# Patient Record
Sex: Female | Born: 1955
Health system: Southern US, Community
[De-identification: ages and names within clinical notes are randomized; demographics above are authoritative.]

## PROBLEM LIST (undated history)

## (undated) DIAGNOSIS — D126 Benign neoplasm of colon, unspecified: Secondary | ICD-10-CM

## (undated) DIAGNOSIS — E785 Hyperlipidemia, unspecified: Secondary | ICD-10-CM

## (undated) DIAGNOSIS — D649 Anemia, unspecified: Secondary | ICD-10-CM

## (undated) DIAGNOSIS — B20 Human immunodeficiency virus [HIV] disease: Secondary | ICD-10-CM

## (undated) DIAGNOSIS — D259 Leiomyoma of uterus, unspecified: Secondary | ICD-10-CM

## (undated) DIAGNOSIS — F17200 Nicotine dependence, unspecified, uncomplicated: Secondary | ICD-10-CM

## (undated) DIAGNOSIS — I10 Essential (primary) hypertension: Secondary | ICD-10-CM

## (undated) DIAGNOSIS — L732 Hidradenitis suppurativa: Secondary | ICD-10-CM

## (undated) DIAGNOSIS — F329 Major depressive disorder, single episode, unspecified: Secondary | ICD-10-CM

## (undated) DIAGNOSIS — B1910 Unspecified viral hepatitis B without hepatic coma: Secondary | ICD-10-CM

## (undated) HISTORY — DX: Nicotine dependence, unspecified, uncomplicated: F17.200

## (undated) HISTORY — DX: Unspecified viral hepatitis B without hepatic coma: B19.10

## (undated) HISTORY — DX: Essential (primary) hypertension: I10

## (undated) HISTORY — PX: BREAST LUMPECTOMY: SHX2

## (undated) HISTORY — DX: Hyperlipidemia, unspecified: E78.5

## (undated) HISTORY — DX: Anemia, unspecified: D64.9

## (undated) HISTORY — DX: Human immunodeficiency virus (HIV) disease: B20

## (undated) HISTORY — DX: Hidradenitis suppurativa: L73.2

## (undated) HISTORY — DX: Major depressive disorder, single episode, unspecified: F32.9

## (undated) HISTORY — PX: OTHER SURGICAL HISTORY: SHX169

## (undated) HISTORY — DX: Benign neoplasm of colon, unspecified: D12.6

## (undated) HISTORY — DX: Leiomyoma of uterus, unspecified: D25.9

---

## 1997-04-18 ENCOUNTER — Other Ambulatory Visit: Admission: RE | Admit: 1997-04-18 | Discharge: 1997-04-18 | Payer: Self-pay | Admitting: Family Medicine

## 1997-05-08 ENCOUNTER — Encounter: Admission: RE | Admit: 1997-05-08 | Discharge: 1997-05-08 | Payer: Self-pay | Admitting: Family Medicine

## 1997-05-22 ENCOUNTER — Encounter: Admission: RE | Admit: 1997-05-22 | Discharge: 1997-05-22 | Payer: Self-pay | Admitting: Family Medicine

## 1997-08-30 ENCOUNTER — Encounter: Admission: RE | Admit: 1997-08-30 | Discharge: 1997-08-30 | Payer: Self-pay | Admitting: Family Medicine

## 1997-10-04 ENCOUNTER — Encounter: Admission: RE | Admit: 1997-10-04 | Discharge: 1997-10-04 | Payer: Self-pay | Admitting: Family Medicine

## 1997-10-18 ENCOUNTER — Encounter: Admission: RE | Admit: 1997-10-18 | Discharge: 1997-10-18 | Payer: Self-pay | Admitting: Family Medicine

## 1997-11-13 ENCOUNTER — Encounter: Admission: RE | Admit: 1997-11-13 | Discharge: 1997-11-13 | Payer: Self-pay | Admitting: Sports Medicine

## 1998-03-05 ENCOUNTER — Encounter: Admission: RE | Admit: 1998-03-05 | Discharge: 1998-03-05 | Payer: Self-pay | Admitting: Sports Medicine

## 1998-07-09 ENCOUNTER — Encounter: Admission: RE | Admit: 1998-07-09 | Discharge: 1998-07-09 | Payer: Self-pay | Admitting: Family Medicine

## 1998-08-08 ENCOUNTER — Encounter: Admission: RE | Admit: 1998-08-08 | Discharge: 1998-08-08 | Payer: Self-pay | Admitting: Family Medicine

## 1998-12-04 ENCOUNTER — Encounter: Admission: RE | Admit: 1998-12-04 | Discharge: 1998-12-04 | Payer: Self-pay | Admitting: Family Medicine

## 1999-01-27 HISTORY — PX: OVARIAN CYST REMOVAL: SHX89

## 1999-07-04 ENCOUNTER — Encounter: Admission: RE | Admit: 1999-07-04 | Discharge: 1999-07-04 | Payer: Self-pay | Admitting: Family Medicine

## 1999-07-17 ENCOUNTER — Encounter: Payer: Self-pay | Admitting: General Surgery

## 1999-07-18 ENCOUNTER — Encounter (INDEPENDENT_AMBULATORY_CARE_PROVIDER_SITE_OTHER): Payer: Self-pay | Admitting: Specialist

## 1999-07-18 ENCOUNTER — Ambulatory Visit (HOSPITAL_COMMUNITY): Admission: RE | Admit: 1999-07-18 | Discharge: 1999-07-18 | Payer: Self-pay | Admitting: General Surgery

## 1999-08-21 ENCOUNTER — Encounter: Admission: RE | Admit: 1999-08-21 | Discharge: 1999-08-21 | Payer: Self-pay | Admitting: Family Medicine

## 1999-08-28 ENCOUNTER — Encounter: Admission: RE | Admit: 1999-08-28 | Discharge: 1999-08-28 | Payer: Self-pay | Admitting: *Deleted

## 1999-08-28 ENCOUNTER — Encounter: Payer: Self-pay | Admitting: *Deleted

## 1999-09-22 ENCOUNTER — Encounter: Admission: RE | Admit: 1999-09-22 | Discharge: 1999-09-22 | Payer: Self-pay | Admitting: Family Medicine

## 1999-10-02 ENCOUNTER — Encounter: Admission: RE | Admit: 1999-10-02 | Discharge: 1999-10-02 | Payer: Self-pay | Admitting: Family Medicine

## 1999-10-23 ENCOUNTER — Encounter: Admission: RE | Admit: 1999-10-23 | Discharge: 1999-10-23 | Payer: Self-pay | Admitting: Obstetrics

## 2000-05-26 ENCOUNTER — Encounter: Admission: RE | Admit: 2000-05-26 | Discharge: 2000-05-26 | Payer: Self-pay | Admitting: Family Medicine

## 2000-06-29 ENCOUNTER — Encounter: Admission: RE | Admit: 2000-06-29 | Discharge: 2000-06-29 | Payer: Self-pay | Admitting: Family Medicine

## 2000-07-01 ENCOUNTER — Encounter: Admission: RE | Admit: 2000-07-01 | Discharge: 2000-07-01 | Payer: Self-pay | Admitting: Sports Medicine

## 2000-07-01 ENCOUNTER — Encounter: Payer: Self-pay | Admitting: Sports Medicine

## 2000-07-08 ENCOUNTER — Encounter: Admission: RE | Admit: 2000-07-08 | Discharge: 2000-10-06 | Payer: Self-pay | Admitting: *Deleted

## 2000-07-13 ENCOUNTER — Encounter: Admission: RE | Admit: 2000-07-13 | Discharge: 2000-07-13 | Payer: Self-pay | Admitting: Sports Medicine

## 2000-08-03 ENCOUNTER — Encounter: Admission: RE | Admit: 2000-08-03 | Discharge: 2000-08-03 | Payer: Self-pay | Admitting: Obstetrics & Gynecology

## 2001-01-12 ENCOUNTER — Encounter: Admission: RE | Admit: 2001-01-12 | Discharge: 2001-01-12 | Payer: Self-pay | Admitting: Family Medicine

## 2001-01-27 ENCOUNTER — Encounter: Admission: RE | Admit: 2001-01-27 | Discharge: 2001-01-27 | Payer: Self-pay | Admitting: Family Medicine

## 2001-04-25 ENCOUNTER — Encounter (INDEPENDENT_AMBULATORY_CARE_PROVIDER_SITE_OTHER): Payer: Self-pay | Admitting: *Deleted

## 2001-04-25 ENCOUNTER — Encounter: Admission: RE | Admit: 2001-04-25 | Discharge: 2001-04-25 | Payer: Self-pay | Admitting: Family Medicine

## 2001-05-03 ENCOUNTER — Encounter: Payer: Self-pay | Admitting: Sports Medicine

## 2001-08-24 ENCOUNTER — Encounter: Admission: RE | Admit: 2001-08-24 | Discharge: 2001-08-24 | Payer: Self-pay | Admitting: Family Medicine

## 2002-01-03 ENCOUNTER — Encounter: Admission: RE | Admit: 2002-01-03 | Discharge: 2002-01-03 | Payer: Self-pay | Admitting: Family Medicine

## 2002-07-05 ENCOUNTER — Encounter: Admission: RE | Admit: 2002-07-05 | Discharge: 2002-07-05 | Payer: Self-pay | Admitting: Family Medicine

## 2003-01-23 ENCOUNTER — Encounter: Admission: RE | Admit: 2003-01-23 | Discharge: 2003-01-23 | Payer: Self-pay | Admitting: Family Medicine

## 2003-01-25 ENCOUNTER — Encounter: Admission: RE | Admit: 2003-01-25 | Discharge: 2003-01-25 | Payer: Self-pay | Admitting: Family Medicine

## 2003-01-29 ENCOUNTER — Encounter: Admission: RE | Admit: 2003-01-29 | Discharge: 2003-01-29 | Payer: Self-pay | Admitting: Family Medicine

## 2003-02-02 ENCOUNTER — Encounter: Admission: RE | Admit: 2003-02-02 | Discharge: 2003-02-02 | Payer: Self-pay | Admitting: Family Medicine

## 2003-10-03 ENCOUNTER — Ambulatory Visit: Payer: Self-pay | Admitting: Family Medicine

## 2003-10-24 ENCOUNTER — Ambulatory Visit: Payer: Self-pay | Admitting: Sports Medicine

## 2003-10-31 ENCOUNTER — Encounter: Admission: RE | Admit: 2003-10-31 | Discharge: 2003-10-31 | Payer: Self-pay | Admitting: Sports Medicine

## 2003-11-22 ENCOUNTER — Ambulatory Visit: Payer: Self-pay | Admitting: Family Medicine

## 2004-02-06 ENCOUNTER — Ambulatory Visit: Payer: Self-pay | Admitting: Infectious Diseases

## 2004-02-06 ENCOUNTER — Encounter (INDEPENDENT_AMBULATORY_CARE_PROVIDER_SITE_OTHER): Payer: Self-pay | Admitting: *Deleted

## 2004-02-06 ENCOUNTER — Ambulatory Visit (HOSPITAL_COMMUNITY): Admission: RE | Admit: 2004-02-06 | Discharge: 2004-02-06 | Payer: Self-pay | Admitting: Infectious Diseases

## 2004-02-06 LAB — CONVERTED CEMR LAB
CD4 Count: 1200 microliters
CD4 T Cell Abs: 1200

## 2004-02-25 ENCOUNTER — Ambulatory Visit: Payer: Self-pay | Admitting: Infectious Diseases

## 2004-03-08 ENCOUNTER — Emergency Department (HOSPITAL_COMMUNITY): Admission: AD | Admit: 2004-03-08 | Discharge: 2004-03-08 | Payer: Self-pay | Admitting: Family Medicine

## 2004-04-28 ENCOUNTER — Ambulatory Visit: Payer: Self-pay | Admitting: Family Medicine

## 2004-05-21 ENCOUNTER — Ambulatory Visit: Payer: Self-pay | Admitting: Family Medicine

## 2004-07-08 ENCOUNTER — Ambulatory Visit: Payer: Self-pay | Admitting: Family Medicine

## 2004-07-09 ENCOUNTER — Ambulatory Visit (HOSPITAL_COMMUNITY): Admission: RE | Admit: 2004-07-09 | Discharge: 2004-07-09 | Payer: Self-pay | Admitting: Infectious Diseases

## 2004-07-09 ENCOUNTER — Encounter (INDEPENDENT_AMBULATORY_CARE_PROVIDER_SITE_OTHER): Payer: Self-pay | Admitting: *Deleted

## 2004-07-09 ENCOUNTER — Ambulatory Visit: Payer: Self-pay | Admitting: Infectious Diseases

## 2004-07-09 LAB — CONVERTED CEMR LAB: CD4 Count: 1320 microliters

## 2004-11-17 ENCOUNTER — Ambulatory Visit: Payer: Self-pay | Admitting: Family Medicine

## 2005-02-27 ENCOUNTER — Ambulatory Visit: Payer: Self-pay | Admitting: Family Medicine

## 2005-03-12 ENCOUNTER — Ambulatory Visit: Payer: Self-pay | Admitting: Infectious Diseases

## 2005-03-12 ENCOUNTER — Encounter (INDEPENDENT_AMBULATORY_CARE_PROVIDER_SITE_OTHER): Payer: Self-pay | Admitting: *Deleted

## 2005-03-18 ENCOUNTER — Encounter: Admission: RE | Admit: 2005-03-18 | Discharge: 2005-03-18 | Payer: Self-pay | Admitting: Sports Medicine

## 2005-03-26 ENCOUNTER — Ambulatory Visit: Payer: Self-pay | Admitting: Infectious Diseases

## 2005-04-24 ENCOUNTER — Ambulatory Visit: Payer: Self-pay | Admitting: Family Medicine

## 2005-07-15 ENCOUNTER — Ambulatory Visit: Payer: Self-pay | Admitting: Family Medicine

## 2005-07-15 ENCOUNTER — Ambulatory Visit (HOSPITAL_COMMUNITY): Admission: RE | Admit: 2005-07-15 | Discharge: 2005-07-15 | Payer: Self-pay | Admitting: Family Medicine

## 2006-03-05 ENCOUNTER — Encounter (INDEPENDENT_AMBULATORY_CARE_PROVIDER_SITE_OTHER): Payer: Self-pay | Admitting: *Deleted

## 2006-03-10 ENCOUNTER — Ambulatory Visit: Payer: Self-pay | Admitting: Family Medicine

## 2006-03-10 ENCOUNTER — Encounter: Payer: Self-pay | Admitting: Family Medicine

## 2006-03-10 ENCOUNTER — Other Ambulatory Visit: Admission: RE | Admit: 2006-03-10 | Discharge: 2006-03-10 | Payer: Self-pay | Admitting: Family Medicine

## 2006-03-10 ENCOUNTER — Encounter (INDEPENDENT_AMBULATORY_CARE_PROVIDER_SITE_OTHER): Payer: Self-pay | Admitting: *Deleted

## 2006-03-22 ENCOUNTER — Encounter (INDEPENDENT_AMBULATORY_CARE_PROVIDER_SITE_OTHER): Payer: Self-pay | Admitting: *Deleted

## 2006-03-22 LAB — CONVERTED CEMR LAB: Pap Smear: NORMAL

## 2006-03-24 ENCOUNTER — Encounter: Admission: RE | Admit: 2006-03-24 | Discharge: 2006-03-24 | Payer: Self-pay | Admitting: Sports Medicine

## 2006-03-25 DIAGNOSIS — B1910 Unspecified viral hepatitis B without hepatic coma: Secondary | ICD-10-CM | POA: Insufficient documentation

## 2006-03-25 DIAGNOSIS — E785 Hyperlipidemia, unspecified: Secondary | ICD-10-CM

## 2006-03-25 DIAGNOSIS — F17200 Nicotine dependence, unspecified, uncomplicated: Secondary | ICD-10-CM

## 2006-03-25 DIAGNOSIS — J309 Allergic rhinitis, unspecified: Secondary | ICD-10-CM | POA: Insufficient documentation

## 2006-03-25 DIAGNOSIS — B20 Human immunodeficiency virus [HIV] disease: Secondary | ICD-10-CM | POA: Insufficient documentation

## 2006-03-25 DIAGNOSIS — D259 Leiomyoma of uterus, unspecified: Secondary | ICD-10-CM | POA: Insufficient documentation

## 2006-03-25 DIAGNOSIS — D649 Anemia, unspecified: Secondary | ICD-10-CM | POA: Insufficient documentation

## 2006-03-25 DIAGNOSIS — L732 Hidradenitis suppurativa: Secondary | ICD-10-CM | POA: Insufficient documentation

## 2006-03-25 DIAGNOSIS — Z716 Tobacco abuse counseling: Secondary | ICD-10-CM | POA: Insufficient documentation

## 2006-03-25 DIAGNOSIS — N83209 Unspecified ovarian cyst, unspecified side: Secondary | ICD-10-CM | POA: Insufficient documentation

## 2006-03-25 DIAGNOSIS — N951 Menopausal and female climacteric states: Secondary | ICD-10-CM | POA: Insufficient documentation

## 2006-03-25 HISTORY — DX: Nicotine dependence, unspecified, uncomplicated: F17.200

## 2006-03-25 HISTORY — DX: Leiomyoma of uterus, unspecified: D25.9

## 2006-03-25 HISTORY — DX: Anemia, unspecified: D64.9

## 2006-03-25 HISTORY — DX: Menopausal and female climacteric states: N95.1

## 2006-03-25 HISTORY — DX: Hyperlipidemia, unspecified: E78.5

## 2006-03-25 HISTORY — DX: Human immunodeficiency virus (HIV) disease: B20

## 2006-03-25 HISTORY — DX: Hidradenitis suppurativa: L73.2

## 2006-03-25 HISTORY — DX: Unspecified viral hepatitis B without hepatic coma: B19.10

## 2006-03-26 ENCOUNTER — Encounter (INDEPENDENT_AMBULATORY_CARE_PROVIDER_SITE_OTHER): Payer: Self-pay | Admitting: *Deleted

## 2006-03-31 ENCOUNTER — Telehealth: Payer: Self-pay | Admitting: Family Medicine

## 2006-04-04 ENCOUNTER — Encounter (INDEPENDENT_AMBULATORY_CARE_PROVIDER_SITE_OTHER): Payer: Self-pay | Admitting: *Deleted

## 2006-04-08 ENCOUNTER — Encounter: Admission: RE | Admit: 2006-04-08 | Discharge: 2006-04-08 | Payer: Self-pay | Admitting: Sports Medicine

## 2006-04-09 ENCOUNTER — Encounter: Payer: Self-pay | Admitting: Family Medicine

## 2006-07-08 ENCOUNTER — Encounter: Payer: Self-pay | Admitting: Family Medicine

## 2007-03-17 ENCOUNTER — Telehealth: Payer: Self-pay | Admitting: Psychology

## 2007-03-17 ENCOUNTER — Ambulatory Visit: Payer: Self-pay | Admitting: Family Medicine

## 2007-03-17 ENCOUNTER — Encounter: Payer: Self-pay | Admitting: Family Medicine

## 2007-03-17 DIAGNOSIS — F329 Major depressive disorder, single episode, unspecified: Secondary | ICD-10-CM

## 2007-03-17 DIAGNOSIS — F3289 Other specified depressive episodes: Secondary | ICD-10-CM

## 2007-03-17 DIAGNOSIS — M5136 Other intervertebral disc degeneration, lumbar region: Secondary | ICD-10-CM | POA: Insufficient documentation

## 2007-03-17 DIAGNOSIS — F32A Depression, unspecified: Secondary | ICD-10-CM | POA: Insufficient documentation

## 2007-03-17 DIAGNOSIS — M539 Dorsopathy, unspecified: Secondary | ICD-10-CM | POA: Insufficient documentation

## 2007-03-17 HISTORY — DX: Other specified depressive episodes: F32.89

## 2007-03-17 HISTORY — DX: Major depressive disorder, single episode, unspecified: F32.9

## 2007-03-25 ENCOUNTER — Encounter: Payer: Self-pay | Admitting: Family Medicine

## 2007-03-25 LAB — CONVERTED CEMR LAB
ALT: 9 units/L (ref 0–35)
Absolute CD4: 1270 #/uL (ref 381–1469)
Albumin: 4.3 g/dL (ref 3.5–5.2)
Alkaline Phosphatase: 62 units/L (ref 39–117)
Basophils Relative: 1 % (ref 0–1)
CD4 T Helper %: 46 % (ref 32–62)
CO2: 25 meq/L (ref 19–32)
Calcium: 9.5 mg/dL (ref 8.4–10.5)
Creatinine, Ser: 0.73 mg/dL (ref 0.40–1.20)
Glucose, Bld: 81 mg/dL (ref 70–99)
HCT: 36.8 % (ref 36.0–46.0)
HIV 1 RNA Quant: <50 copies/mL (ref <50)
HIV-1 RNA Quant, Log: <1.7 (ref <1.70)
LDL Cholesterol: 104 mg/dL — ABNORMAL HIGH (ref 0–99)
Lymphs Abs: 2.8 10*3/uL (ref 0.7–4.0)
MCV: 73.3 fL — ABNORMAL LOW (ref 78.0–100.0)
Neutro Abs: 3.5 10*3/uL (ref 1.7–7.7)
Neutrophils Relative %: 51 % (ref 43–77)
RBC: 5.02 M/uL (ref 3.87–5.11)
RDW: 18.3 % — ABNORMAL HIGH (ref 11.5–15.5)
Total lymphocyte count: 2760 cells/mcL (ref 700–3300)
VLDL: 16 mg/dL (ref 0–40)
WBC, lymph enumeration: 6.9 10*3/uL (ref 4.0–10.5)

## 2007-04-04 ENCOUNTER — Ambulatory Visit: Payer: Self-pay | Admitting: Sports Medicine

## 2007-04-18 ENCOUNTER — Ambulatory Visit: Payer: Self-pay | Admitting: Sports Medicine

## 2007-04-26 ENCOUNTER — Ambulatory Visit: Payer: Self-pay | Admitting: Family Medicine

## 2007-04-26 DIAGNOSIS — I10 Essential (primary) hypertension: Secondary | ICD-10-CM | POA: Insufficient documentation

## 2007-04-26 HISTORY — DX: Essential (primary) hypertension: I10

## 2007-05-02 ENCOUNTER — Ambulatory Visit: Payer: Self-pay | Admitting: Psychology

## 2007-05-03 ENCOUNTER — Encounter (INDEPENDENT_AMBULATORY_CARE_PROVIDER_SITE_OTHER): Payer: Self-pay | Admitting: *Deleted

## 2007-05-05 ENCOUNTER — Telehealth: Payer: Self-pay | Admitting: Psychology

## 2007-05-09 ENCOUNTER — Encounter: Admission: RE | Admit: 2007-05-09 | Discharge: 2007-05-09 | Payer: Self-pay | Admitting: Infectious Disease

## 2007-05-09 ENCOUNTER — Ambulatory Visit: Payer: Self-pay | Admitting: Infectious Disease

## 2007-05-09 LAB — CONVERTED CEMR LAB
Alkaline Phosphatase: 75 units/L (ref 39–117)
Basophils Relative: 1 % (ref 0–1)
CO2: 20 meq/L (ref 19–32)
Calcium: 9.7 mg/dL (ref 8.4–10.5)
Chloride: 106 meq/L (ref 96–112)
Creatinine, Ser: 0.71 mg/dL (ref 0.40–1.20)
Eosinophils Relative: 2 % (ref 0–5)
Glucose, Bld: 79 mg/dL (ref 70–99)
HCT: 36.6 % (ref 36.0–46.0)
HIV 1 RNA Quant: <50 copies/mL (ref <50)
HIV-1 RNA Quant, Log: <1.7 (ref <1.70)
Neutro Abs: 2.7 10*3/uL (ref 1.7–7.7)
Neutrophils Relative %: 46 % (ref 43–77)
Platelets: 518 10*3/uL — ABNORMAL HIGH (ref 150–400)
RBC: 5.14 M/uL — ABNORMAL HIGH (ref 3.87–5.11)
Sodium: 138 meq/L (ref 135–145)
Total Bilirubin: 0.3 mg/dL (ref 0.3–1.2)
WBC: 5.9 10*3/uL (ref 4.0–10.5)

## 2007-05-16 ENCOUNTER — Telehealth: Payer: Self-pay | Admitting: Psychology

## 2007-05-24 ENCOUNTER — Encounter: Admission: RE | Admit: 2007-05-24 | Discharge: 2007-05-24 | Payer: Self-pay | Admitting: Family Medicine

## 2007-09-05 ENCOUNTER — Encounter: Admission: RE | Admit: 2007-09-05 | Discharge: 2007-09-05 | Payer: Self-pay | Admitting: Infectious Disease

## 2007-09-05 ENCOUNTER — Ambulatory Visit: Payer: Self-pay | Admitting: Infectious Disease

## 2007-09-05 LAB — CONVERTED CEMR LAB
ALT: 11 units/L (ref 0–35)
Albumin: 4.3 g/dL (ref 3.5–5.2)
Alkaline Phosphatase: 68 units/L (ref 39–117)
BUN: 10 mg/dL (ref 6–23)
Basophils Absolute: 0.1 10*3/uL (ref 0.0–0.1)
CO2: 22 meq/L (ref 19–32)
Chloride: 105 meq/L (ref 96–112)
Creatinine, Ser: 0.68 mg/dL (ref 0.40–1.20)
HIV 1 RNA Quant: <50 copies/mL (ref <50)
MCHC: 32.6 g/dL (ref 30.0–36.0)
MCV: 73.4 fL — ABNORMAL LOW (ref 78.0–100.0)
Neutrophils Relative %: 42 % — ABNORMAL LOW (ref 43–77)
Potassium: 4.6 meq/L (ref 3.5–5.3)
RBC: 5.3 M/uL — ABNORMAL HIGH (ref 3.87–5.11)
Sodium: 140 meq/L (ref 135–145)
Total Protein: 8.3 g/dL (ref 6.0–8.3)

## 2007-09-08 ENCOUNTER — Ambulatory Visit: Payer: Self-pay | Admitting: Family Medicine

## 2007-09-19 ENCOUNTER — Ambulatory Visit: Payer: Self-pay | Admitting: Infectious Disease

## 2007-09-19 LAB — CONVERTED CEMR LAB
Chlamydia, Swab/Urine, PCR: NEGATIVE
GC Probe Amp, Urine: NEGATIVE

## 2007-09-26 ENCOUNTER — Encounter: Payer: Self-pay | Admitting: Infectious Disease

## 2007-10-04 ENCOUNTER — Ambulatory Visit: Payer: Self-pay | Admitting: Infectious Disease

## 2007-10-04 ENCOUNTER — Encounter: Payer: Self-pay | Admitting: Infectious Disease

## 2007-10-19 ENCOUNTER — Encounter: Payer: Self-pay | Admitting: Infectious Disease

## 2008-05-01 ENCOUNTER — Ambulatory Visit: Payer: Self-pay | Admitting: Family Medicine

## 2008-05-14 ENCOUNTER — Encounter: Payer: Self-pay | Admitting: *Deleted

## 2009-04-01 ENCOUNTER — Ambulatory Visit: Payer: Self-pay | Admitting: Infectious Disease

## 2009-04-01 LAB — CONVERTED CEMR LAB
ALT: 13 units/L (ref 0–35)
AST: 12 units/L (ref 0–37)
Albumin: 4.1 g/dL (ref 3.5–5.2)
BUN: 9 mg/dL (ref 6–23)
Basophils Relative: 1 % (ref 0–1)
Creatinine, Ser: 0.7 mg/dL (ref 0.40–1.20)
HCT: 36.1 % (ref 36.0–46.0)
HDL: 49 mg/dL (ref >39)
HIV 1 RNA Quant: <48 copies/mL (ref <48)
HIV-1 RNA Quant, Log: <1.68 (ref <1.68)
Lymphs Abs: 3 10*3/uL (ref 0.7–4.0)
MCHC: 32.7 g/dL (ref 30.0–36.0)
Monocytes Relative: 6 % (ref 3–12)
RBC: 4.85 M/uL (ref 3.87–5.11)
RDW: 17.6 % — ABNORMAL HIGH (ref 11.5–15.5)
Total CHOL/HDL Ratio: 4.1
Total Protein: 7.8 g/dL (ref 6.0–8.3)
Triglycerides: 83 mg/dL (ref <150)
VLDL: 17 mg/dL (ref 0–40)

## 2009-04-15 ENCOUNTER — Ambulatory Visit: Payer: Self-pay | Admitting: Infectious Disease

## 2009-04-22 ENCOUNTER — Emergency Department (HOSPITAL_COMMUNITY): Admission: EM | Admit: 2009-04-22 | Discharge: 2009-04-22 | Payer: Self-pay | Admitting: Emergency Medicine

## 2009-05-09 ENCOUNTER — Ambulatory Visit: Payer: Self-pay | Admitting: Family Medicine

## 2009-05-27 ENCOUNTER — Encounter: Payer: Self-pay | Admitting: Infectious Disease

## 2009-06-11 ENCOUNTER — Ambulatory Visit: Payer: Self-pay | Admitting: Family Medicine

## 2009-06-14 ENCOUNTER — Encounter: Payer: Self-pay | Admitting: Family Medicine

## 2009-06-18 ENCOUNTER — Encounter: Admission: RE | Admit: 2009-06-18 | Discharge: 2009-06-18 | Payer: Self-pay | Admitting: Family Medicine

## 2009-09-27 ENCOUNTER — Ambulatory Visit: Payer: Self-pay | Admitting: Family Medicine

## 2010-02-25 NOTE — Letter (Signed)
Summary: Results Follow-up Letter  [facility]  [address]   [place], [place] [address]   Phone: [phone]  Fax: [phone]    06/14/2009  [address] [place], [place]  [address]  Dear Ms. Graciela Husbands,   The following are the results of your recent test(s):  Test     Result     Pap Smear    Normal____X___  Not Normal_____       Comments:  Next Pap will be in 3 yrs.  Sincerely,  Bobby Rumpf  MD [facility]           Appended Document: Results Follow-up Letter mailed.

## 2010-02-25 NOTE — Assessment & Plan Note (Signed)
Summary: EST-CK/FU/MEDS/CFB   Visit Type:  Follow-up Primary Provider:  Norton Blizzard  CC:  f/u, Depression, hot flashes are very bad, and seating alot at night and can't sleep.  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 She has gotten back to gether with her former husband (who is HIV negative she laims) She states that they have had intercourse twce in past 6 months. She calims to be using condoms with intercourse. She has no comlaints today.  Depression History:      The patient denies a depressed mood most of the day and a diminished interest in her usual daily activities.        The patient denies that she feels like life is not worth living, denies that she wishes that she were dead, and denies that she has thought about ending her life.        Problems Prior to Update: 1)  Laceration of Finger  (ICD-883.0) 2)  Preventive Health Care  (ICD-V70.0) 3)  Screening For Malignant Neoplasm of The Cervix  (ICD-V76.2) 4)  Elevated Blood Pressure Without Diagnosis of Hypertension  (ICD-796.2) 5)  Back Pain, Lumbar  (ICD-724.2) 6)  Depression  (ICD-311) 7)  Uterine Fibroid  (ICD-218.9) 8)  Tobacco Dependence  (ICD-305.1) 9)  Rhinitis, Allergic  (ICD-477.9) 10)  Ovarian Cyst  (ICD-620.2) 11)  Menopausal Syndrome  (ICD-627.2) 12)  Hyperlipidemia  (ICD-272.4) 13)  Hydradenitis  (ICD-705.83) 14)  HIV  (ICD-042) 15)  Hepatitis B  (ICD-070.30) 16)  Anemia, Other, Unspecified  (ICD-285.9)  Medications Prior to Update: 1)  None  Current Medications (verified): 1)  None  Allergies (verified): No Known Drug Allergies       Preventive Screening-Counseling & Management  Alcohol-Tobacco     Smoking Status: current     Smoking Cessation Counseling: yes     Packs/Day: 0.5      Sexual History:  currently monogamous.        Drug Use:  never.        Blood Transfusions:  no.       Current Allergies (reviewed today): No known allergies  Past History:  Past Surgical History: Last updated: 03/25/2006 Cyst removal--[place] - 08/21/1999  Family History: Last updated: 03/25/2006 ?Aunt w/ CABG., Mom w/ dementia?, Mom, 2 aunts had hysterectomy, but pt not sure why., Unsure of dad`s side.  Social History: Last updated: 05/09/2007 Tob abuse 1 ppd since 29 yoa.; h/o drugs (MJ, cocaine, pills)  and etoh abuse.; Works at [employer].  Lives with Mother.  Has one adult daughter living in [place]  Risk Factors: Smoking Status: current (04/15/2009) Packs/Day: 0.5 (04/15/2009)  Past Medical History: HIV--lian "elite controller": 06/00 - CD4=1180; Viral Load <25, 06/01 - CD4=1000; Viral Load <50, microcytosis- work up w/ferritin wnl02/06 - CD4=1200; Viral Load <50,  02/07 - CD4=1600, Viral Load <50, HGB 11.9, MCV 72,  04/09 cd4 940 v;l<50 08/09 CD4= 1220, viral load <50 Past Hepatis B infection, Hep b DNA undetectable in June Hidradenitis suppurativa Smoking Depression Seasonal Allergies  Social History: Drug Use:  never Blood Transfusions:  no Sexual History:  currently monogamous  Vital Signs:  Patient profile:   55 year old female Height:      61 inches (154.94 cm) Weight:      150 pounds (68.18 kg) BMI:     28.44 Temp:     99.2 degrees F (37.33 degrees C) oral Pulse  rate:   89 / minute BP sitting:   131 / 84  (left arm)  Vitals Entered By: Starleen Arms CMA (April 15, 2009 2:49 PM) CC: f/u, Depression, hot flashes are very bad, seating alot at night and can't sleep Is Patient Diabetic? No Pain Assessment Patient in pain? yes     Location: back Intensity: 9 Type: aching Onset of pain  Chronic Nutritional Status BMI of 25 - 29 = overweight Nutritional Status Detail nl  Does patient need assistance? Functional Status Self care Ambulation Normal   Physical Exam  General:  alert, well-developed, well-nourished, and overweight-appearing.    Head:  normocephalic and atraumatic.   Eyes:  vision grossly intact.   Ears:  no external deformities.   Nose:  no external deformity.   Mouth:  pharynx pink and moist and no erythema.   Lungs:  normal respiratory effort, no intercostal retractions, no accessory muscle use, no crackles, and no wheezes.   Heart:  normal rate, regular rhythm, no murmur, and no gallop.   Abdomen:  no distention.   Msk:  2nd digit on left hand with 1 cm laceration, oozing a little through dried clot.   Extremities:  No clubbing, cyanosis, edema, or deformity noted with normal full range of motion of all joints.   Neurologic:  alert & oriented X3.  gait normal.   Psych:  Oriented X3.  normally interactive and good eye contact.             Prevention For Positives: 04/15/2009   Safe sex practices discussed with patient. Condoms offered.   Education Materials Provided: 04/15/2009 Safe sex practices discussed with patient. Condoms offered.                          Impression & Recommendations:  Problem # 1:  HIV (ICD-042)  Recheck VL and CD4 count. Elite controller. Had tried to refer to study in [facility] but it is now closed.  Orders: Est. Patient Level IV ([address])  Problem # 2:  ELEVATED BLOOD PRESSURE WITHOUT DIAGNOSIS OF HYPERTENSION (ICD-796.2)  BP much better controlled BP today: 131/84 Prior BP: 169/95 (05/01/2008)  Labs Reviewed: Creat: 0.70 (04/01/2009) Chol: 200 (04/01/2009)   HDL: 49 (04/01/2009)   LDL: 134 (04/01/2009)   TG: 83 (04/01/2009)  Instructed in low sodium diet (DASH Handout) and behavior modification.    Orders: Est. Patient Level IV ([address])  Problem # 3:  HYDRADENITIS (ICD-705.83)  no active at present per pt  Orders: Est. Patient Level IV ([address]11)  Problem # 4:  HEPATITIS B (ICD-070.30)  undetectable viral load when checked  Orders: Est. Patient Level IV (91478)  Other Orders: T-GC Probe, urine ([id number]) T-Chlamydia  Probe, urine  ([id number]) Future Orders: T-HIV Viral Load ([id number]) ... 04/10/2010 T-CBC w/Diff ([id number]) ... 04/10/2010 T-Comprehensive Metabolic Panel [phone]) ... 04/10/2010 T-CD4SP ([facility]) (CD4SP) ... 04/10/2010 T-Syphilis Test (RPR) [phone]) ... 04/14/2010  Patient Instructions: 1)  Please schedule a follow-up appointment in 1 year.   Process Orders Check Orders Results:     [facility]: ABN not required for this insurance Tests Sent for requisitioning (April 15, 2009 11:42 PM):     04/10/2010: [facility] -- T-HIV Viral Load [phone] (signed)     04/10/2010: [facility] -- T-CBC w/Diff [[id number]] (signed)     04/10/2010: [facility] -- T-Comprehensive Metabolic Panel [[id number]] (signed)     04/15/2009: [facility] -- T-GC Probe, urine [id number] (signed)  04/15/2009: [facility] -- T-Chlamydia  Probe, urine [phone] (signed)     04/14/2010: [facility] -- T-Syphilis Test (RPR) [phone] (signed)

## 2010-02-25 NOTE — Miscellaneous (Signed)
Summary: Orders Update - LABS  Clinical Lists Changes  Orders: Added new Test order of T-Lipid Profile [phone]) - Signed Added new Test order of T-CBC w/Diff [phone]) - Signed Added new Test order of T-CD4SP [facility]) (CD4SP) - Signed Added new Test order of T-Comprehensive Metabolic Panel [phone]) - Signed Added new Test order of T-HIV Viral Load [phone]) - Signed Added new Test order of T-RPR (Syphilis) ([id number]) - Signed     Process Orders Check Orders Results:     [facility]: ABN not required for this insurance Order queued for requisitioning for [facility]: April 01, 2009 10:17 AM  Tests Sent for requisitioning (April 01, 2009 10:17 AM):     04/01/2009: [facility] -- T-Lipid Profile [phone] (signed)     04/01/2009: [facility] -- T-CBC w/Diff [[id number]] (signed)     04/01/2009: [facility] -- T-Comprehensive Metabolic Panel [[id number]] (signed)     04/01/2009: [facility] -- T-HIV Viral Load [phone] (signed)     04/01/2009: [facility] -- T-RPR (Syphilis) [phone] (signed)

## 2010-02-25 NOTE — Assessment & Plan Note (Signed)
Summary: f/u eo   Vital Signs:  Patient profile:   55 year old female Height:      61 inches Weight:      156 pounds BMI:     29.58 BSA:     1.70 Temp:     98.1 degrees F Pulse rate:   89 / minute BP sitting:   166 / 89  Vitals Entered By: Jone Baseman CMA (Jun 11, 2009 2:07 PM) CC: physical Is Patient Diabetic? No Pain Assessment Patient in pain? yes     Location: back Intensity: 1   Primary Care Provider:  Bobby Rumpf  MD  CC:  physical.  History of Present Illness: 1) Elevated blood pressure: Does not want to start medication - "I don't like medicine". Reports BPs at pharmacy 140-150 systolics. Does not have diagnosis of HTN. Does not want BP rechecked. Willing to discuss at next appointment. Elevated BP in past w/ better control w/ DASH diet. Family history of HTN. Does not want   2) HIV: Followed at ID clinic by Dr. Daiva Eves. Seen in March 2011 w/ plan for follow up in one year. Per their prior assessment she is an  "elite controller" with persistently undetectable viral loads and cd4 counts above 1000. Never on antiretrovirals.   3) Smoking: 1 pack per day x several years. Has not started nicotine patch. Still wants to quit but does not think she can because husband is a smoker (he does not want to quit).   4) Prevention: Reports colonoscopy 2-4 years ago w/ benign polyps removed (unable to find record of this). Pap today (last Pap in 2009 negative). Mammogram 3 yrs ago wnl.     Habits & Providers  Alcohol-Tobacco-Diet     Tobacco Status: current     Tobacco Counseling: to quit use of tobacco products     Cigarette Packs/Day: 1.0  Current Medications (verified): 1)  [facility] Nicotine 21 Mg/24hr Pt24 (Nicotine) .... One Patch - Apply To Skin Daily. As Per Instructions  Allergies (verified): No Known Drug Allergies  Social History: Packs/Day:  1.0  Review of Systems       as per HPI, also positive for mild constipation. o/w negative for balance of 10 systems    Physical Exam  General:  NAD, pleasant  Eyes:  pupils equal, round and reactive to light , extraoccular movements intact ,  Nose:  no rhinorrhea  Mouth:  no lesions or masses  Neck:  no lymphadenopathy   Lungs:  CTAB w/o wheeze or crackles  Heart:  RRR w/o murmurs  Abdomen:  soft, non-tender, normal bowel sounds, no distention, no masses, no guarding, no rigidity, and no rebound tenderness.   Genitalia:  normal introitus, no external lesions, no vaginal discharge, mucosa pink and moist, no vaginal or cervical lesions, no vaginal atrophy, and no friaility or hemorrhage.   uterus mild tender to palpation. no adnexal masses or tenderness  Msk:  No deformity or scoliosis noted of thoracic or lumbar spine.   Pulses:  2+ radials  Extremities:  no edema  Neurologic:  alert & oriented X3, cranial nerves II-XII intact, strength normal in all extremities, sensation intact to light touch, and DTRs symmetrical and normal.   Skin:  no suspicious lesions  Cervical Nodes:  no anterior cervical adenopathy and no posterior cervical adenopathy.   Axillary Nodes:  no R axillary adenopathy and no L axillary adenopathy.   Psych:  Oriented X3, memory intact for recent and remote, normally interactive, not  anxious appearing, not depressed appearing, not agitated, and poor eye contact.     Impression & Recommendations:  Problem # 1:  HIV (ICD-042) Assessment Unchanged Recheck VL and CD4 count in one year. Elite controller. Follow at [facility].   HIV: HIV positive - not AIDS (09/19/2007)   CD4: 1280 (04/02/2009)   WBC: 6.7 (04/01/2009)   Hgb: 11.8 (04/01/2009)   HCT: 36.1 (04/01/2009)   Platelets: 467 (04/01/2009) HIV-1 RNA: <48 copies/mL (04/01/2009)   HBSAg: NO (03/22/2006)  Problem # 2:  PREVENTIVE HEALTH CARE (ICD-V70.0) Assessment: Comment Only  Pap today. Referral for mammogram. Will review records for colonoscopy results and arrange follow up as appropriate   Orders: [facility] - Est  40-64 yrs  ([address])  Problem # 3:  TOBACCO DEPENDENCE (ICD-305.1) Assessment: Unchanged Contemplative phase. Encouraged to start nicotine patch. Follow up in 6 months.   Her updated medication list for this problem includes:    [facility] Nicotine 21 Mg/24hr Pt24 (Nicotine) ..... One patch - apply to skin daily. as per instructions  Problem # 4:  HYPERTENSION, BENIGN ESSENTIAL (ICD-401.1) Assessment: New  Does not wish to start medications. Advised DASH diet, exercise. Will follow up in 6 months. Advised to check BPs outpatient.   BP today: 166/89 Prior BP: 165/105 (05/09/2009)  Labs Reviewed: K+: 4.4 (04/01/2009) Creat: : 0.70 (04/01/2009)   Chol: 200 (04/01/2009)   HDL: 49 (04/01/2009)   LDL: 134 (04/01/2009)   TG: 83 (04/01/2009)  Complete Medication List: 1)  [facility] Nicotine 21 Mg/24hr Pt24 (Nicotine) .... One patch - apply to skin daily. as per instructions  Other Orders: Pap Smear-[facility] ([id number])  Patient Instructions: 1)  Monitor your salt intake - keep your sodium below 2000 mg daily (remember canned foods have a lot of salt in them - eat fresh or frozen foods) 2)  Try to walk 30 - 45 minutes 4-5 times per week.  3)  Try to quit smoking.  4)  Go to have a mammogram done.  5)  Follow up in 6 months.    Prevention & Chronic Care Immunizations   Influenza vaccine: refused  (03/17/2007)   Influenza vaccine due: 03/16/2008    Tetanus booster: 03/17/2007: given   Tetanus booster due: 03/16/2017    Pneumococcal vaccine: Done.  (02/27/2004)   Pneumococcal vaccine due: None  Colorectal Screening   Hemoccult: Done.  (04/01/2005)   Hemoccult due: 04/02/2006    Colonoscopy: Done.  (08/01/2005)   Colonoscopy due: 08/02/2015  Other Screening   Pap smear: NEGATIVE FOR INTRAEPITHELIAL LESIONS OR MALIGNANCY.  (10/04/2007)   Pap smear due: 03/22/2009    Mammogram: Normal  (05/24/2007)   Mammogram due: 05/2008   Smoking status: current  (06/11/2009)   Smoking cessation counseling: yes   (04/15/2009)  Lipids   Total Cholesterol: 200  (04/01/2009)   LDL: 134  (04/01/2009)   LDL Direct: Not documented   HDL: 49  (04/01/2009)   Triglycerides: 83  (04/01/2009)    SGOT (AST): 12  (04/01/2009)   SGPT (ALT): 13  (04/01/2009)   Alkaline phosphatase: 63  (04/01/2009)   Total bilirubin: 0.3  (04/01/2009)    Lipid flowsheet reviewed?: Yes   Progress toward LDL goal: Unchanged  Hypertension   Last Blood Pressure: 166 / 89  (06/11/2009)   Serum creatinine: 0.70  (04/01/2009)   Serum potassium 4.4  (04/01/2009)    Hypertension flowsheet reviewed?: Yes   Progress toward BP goal: Unchanged  Self-Management Support :   Personal Goals (by the next clinic visit) :  Personal blood pressure goal: 140/90  (06/11/2009)     Personal LDL goal: 130  (06/11/2009)    Patient will work on the following items until the next clinic visit to reach self-care goals:     Medications and monitoring: check my blood pressure  (06/11/2009)     Eating: drink diet soda or water instead of juice or soda, eat more vegetables, use fresh or frozen vegetables, eat foods that are low in salt, eat baked foods instead of fried foods, eat fruit for snacks and desserts, limit or avoid alcohol  (06/11/2009)     Activity: take a 30 minute walk every day  (06/11/2009)    Hypertension self-management support: Not documented    Hypertension self-management support not done because: Refused  (06/11/2009)    Lipid self-management support: Not documented     Lipid self-management support not done because: Refused  (06/11/2009)

## 2010-02-25 NOTE — Letter (Signed)
Summary: [employer]:  [employer]:   Imported By: Florinda Marker 07/09/2009 14:24:07  _____________________________________________________________________  External Attachment:    Type:   Image     Comment:   External Document

## 2010-02-25 NOTE — Assessment & Plan Note (Signed)
Summary: Connie Wade problem,tcb   Vital Signs:  Patient profile:   55 year old female Height:      61 inches Weight:      163.4 pounds BMI:     30.99 Temp:     98.9 degrees F oral Pulse rate:   87 / minute BP sitting:   144 / 93  (left arm) Cuff size:   regular  Vitals Entered By: Garen Grams LPN (September 27, 2009 9:16 AM) CC: back and legs aching Is Patient Diabetic? No Pain Assessment Patient in pain? yes     Location: back/legs   Primary Care Provider:  Bobby Rumpf  MD  CC:  back and legs aching.  History of Present Illness: 1) Back pain: Low back pain x several months. Worse after standing all day. Better when off of her feet. Works at Clinical research associate at [employer]. Wears flip flops in summer. Job involves a lot of bending, occasional lifting up to 10 lbs. No inciting event. Feels tight and sore. Somewhat relieved by Tylenol and Ibuprofen. Denies fever, chills dysuria, numbness, weakness, tingling, radiation of pain.   2) Tobacco use: Quit on June 9th. Did two weeks of nicotine patches prior to quitting. HAs smoked one cigarette since then to see if it would help relax her with back pain. Denies chronic cough. Husband still smokes.   3)  HTN: Does not want to start medication - "I don't like medicine". Reports BPs at pharmacy = normal (but unable to state what "normal" is). Does not want BP rechecked. Elevated BP in past w/ better control w/ DASH diet. Family history of HTN. Has eliminated all canned foods since our last appointment, has been eating more vegetables.   ROS: Denies chest pain, LE edema, headache, nausea, emesis diarrhea, lethargy, weight loss, weakness, breathing issues,    See prior meds for med rec  Habits & Providers  Alcohol-Tobacco-Diet     Tobacco Status: current     Tobacco Counseling: to quit use of tobacco products     Cigarette Packs/Day: 1.0  Exercise-Depression-Behavior     Drug Use: never  Medications Prior to Update: 1)  [facility] Nicotine 21  Mg/24hr Pt24 (Nicotine) .... One Patch - Apply To Skin Daily. As Per Instructions  Allergies (verified): No Known Drug Allergies  Physical Exam  General:  NAD, pleasant, vitals reviewed  Mouth:  no lesions or masses  Lungs:  CTAB w/o wheeze or crackles  Heart:  RRR w/o murmurs  Abdomen:  soft, non-tender, normal bowel sounds, no distention, no masses, no guarding, no rigidity, and no rebound tenderness, no CVA tenderness  Msk:  - full ROM at lumbar spine with flexion, extension, rotation and lateral bending, though with some increase in pain with extension and lateral bending  - negative SLR bilaterally - negative FABER  - mild tender to palpation paraspinous muscles lower lumbar  - mild to moderate spasm trapezius right > left  Pulses:  2+ radials and pedals  Extremities:  no edema  Neurologic:  alert & oriented X3, cranial nerves II-XII intact, strength normal in all extremities, sensation intact to light touch, and DTRs symmetrical and normal.   Skin:  no suspicious lesions    Impression & Recommendations:  Problem # 1:  BACK PAIN, LUMBAR (ICD-724.2)  Likely lumbar strain given presentation and exam. No red flags on obtained history or exam. Advised regarding shoes with better support during summer. Back exercise handout given. OK to alternate ibuprofen (though care with HTN) and  tylenol. Heat or ice as needed. Proper lifting and bending technique reviewed. Cyclobenzaprine for spasm. Follow up in 6 weeks if not improving, otherwise follow in 6 months.   Her updated medication list for this problem includes:    Cyclobenzaprine Hcl 5 Mg Tabs (Cyclobenzaprine hcl) ..... One tab by mouth up to three times a day for back pain / neck pain. only take at night if makes you drowsy  Orders: [facility]- Est  Level 4 ([address]214)  Problem # 2:  HYPERTENSION, BENIGN ESSENTIAL (ICD-401.1) Assessment: Unchanged  Does not wish to start medications. Advised DASH diet, exercise. Will follow up in 6  months. Advised to check BPs outpatient.   BP today: 144/93 Prior BP: 166/89 (06/11/2009)  Labs Reviewed: K+: 4.4 (04/01/2009) Creat: : 0.70 (04/01/2009)   Chol: 200 (04/01/2009)   HDL: 49 (04/01/2009)   LDL: 134 (04/01/2009)   TG: 83 (04/01/2009)  Orders: [facility]- Est  Level 4 ([address])  Problem # 3:  TOBACCO DEPENDENCE (ICD-305.1) Assessment: Improved  Congratulated on quitting. Advised regarding ways of staying quit. Will follow.   Her updated medication list for this problem includes:    [facility] Nicotine 21 Mg/24hr Pt24 (Nicotine) ..... One patch - apply to skin daily. as per instructions  Orders: [facility]- Est  Level 4 ([address])  Complete Medication List: 1)  [facility] Nicotine 21 Mg/24hr Pt24 (Nicotine) .... One patch - apply to skin daily. as per instructions 2)  Cyclobenzaprine Hcl 5 Mg Tabs (Cyclobenzaprine hcl) .... One tab by mouth up to three times a day for back pain / neck pain. only take at night if makes you drowsy  Patient Instructions: 1)  It was great to see you today!  2)  Follow up in 6 weeks if the back pain is not better, 6 months if it is. 3)  Check your BP once a week and WRITE down numbers 4)  Wear shoes with better support if you are going to be on your feet all day  5)  Do the back exercises as described 6)  Take cyclobenzaprine for back bain as prescirbed. Only ake at night if it makes you drowsy 7)  You can alternate Tylenol or ibuprofen for pain (take ibuprofen (motrin) on a full stomach  8)  Congrats on quitting smoking. do your best to get your husband to quit as well.  Prescriptions: CYCLOBENZAPRINE HCL 5 MG TABS (CYCLOBENZAPRINE HCL) one tab by mouth up to three times a day for back pain / neck pain. only take at night if makes you drowsy  #30 x 0   Entered and Authorized by:   Bobby Rumpf  MD   Signed by:   Bobby Rumpf  MD on 09/28/2009   Method used:   Electronically to        [facility]  [address] [phone]* (retail)       [address]       [place], [place]  [address]       Ph: [phone] or [phone]       Fax: [phone]   RxID:   [id number]

## 2010-02-25 NOTE — Assessment & Plan Note (Signed)
Summary: re-establish care/eo   Vital Signs:  Patient profile:   55 year old female Height:      61 inches Weight:      149.1 pounds BMI:     28.27 Temp:     98.2 degrees F oral Pulse rate:   94 / minute BP sitting:   165 / 105  (left arm) Cuff size:   regular  Vitals Entered By: Garen Grams LPN (May 09, 2009 10:05 AM) CC: cpe Is Patient Diabetic? No Pain Assessment Patient in pain? yes     Location: back   Primary Care Provider:  Bobby Rumpf  MD  CC:  cpe.  History of Present Illness: 1) Elevated blood pressure: Blood pressure up today because "upset today about being told that she was not an [facility] patient". Does not have diagnosis of HTN but has had. Does not want BP rechecked. Willing to discuss at next appointment. Single elevated BP in past w/ better control w/ DASH diet.   2) HIV: Followed at ID clinic by Dr. Daiva Eves. Seen in March 2011 w/ plan for follow up in one year. Per their prior assessment she is an  "elite controller" with persistently undetectable viral loads and cd4 counts above 1000. Never on antiretrovirals.   3) Smoking: 1 pack per day x several years. Wants to quit. Wants nicotine patch. Has quit in past. Husband is a smoker.     Habits & Providers  Alcohol-Tobacco-Diet     Tobacco Status: current  Allergies: No Known Drug Allergies  Past History:  Past Medical History: Last updated: 04/15/2009 HIV--lian "elite controller": 06/00 - CD4=1180; Viral Load <25, 06/01 - CD4=1000; Viral Load <50, microcytosis- work up w/ferritin wnl02/06 - CD4=1200; Viral Load <50,  02/07 - CD4=1600, Viral Load <50, HGB 11.9, MCV 72,  04/09 cd4 940 v;l<50 08/09 CD4= 1220, viral load <50 Past Hepatis B infection, Hep b DNA undetectable in June Hidradenitis suppurativa Smoking Depression Seasonal Allergies  Past Surgical History: Last updated: 03/25/2006 Cyst removal--[place] - 08/21/1999  Family History: Last updated: 03/25/2006 ?Aunt w/ CABG., Mom w/  dementia?, Mom, 2 aunts had hysterectomy, but pt not sure why., Unsure of dad`s side.  Social History: Last updated: 05/09/2009 Tob abuse 1 ppd since 1 yoa.; h/o drugs (MJ, cocaine, pills). USed marijuana recently for chronic back pain, o/w no other  and alcohol drugs currently; Works at [employer]. Married.   Has one adult daughter living in [place]  Social History: Tob abuse 1 ppd since 43 yoa.; h/o drugs (MJ, cocaine, pills). USed marijuana recently for chronic back pain, o/w no other  and alcohol drugs currently; Works at [employer]. Married.   Has one adult daughter living in [place]  Physical Exam  General:  NAD, pleasant  Lungs:  CTAB w/o wheeze or crackles  Heart:  RRR w/o murmurs    Impression & Recommendations:  Problem # 1:  TOBACCO DEPENDENCE (ICD-305.1) Assessment Unchanged  Active phase. Will start nicotine patch. Follow up in one month for CPE.   Her updated medication list for this problem includes:    [facility] Nicotine 21 Mg/24hr Pt24 (Nicotine) ..... One patch - apply to skin daily. as per instructions  Orders: [facility]- Est  Level 4 (99214)  Problem # 2:  ELEVATED BLOOD PRESSURE WITHOUT DIAGNOSIS OF HYPERTENSION (ICD-796.2) Assessment: Unchanged  Did not want recheck BP. Will address at CPE in one month. Advised regarding exercise and DASH diet.   BP today: 165/105 Prior BP: 131/84 (04/15/2009)  Labs Reviewed:  Creat: 0.70 (04/01/2009) Chol: 200 (04/01/2009)   HDL: 49 (04/01/2009)   LDL: 134 (04/01/2009)   TG: 83 (04/01/2009)  Instructed in low sodium diet (DASH Handout) and behavior modification.    Orders: [facility]- Est  Level 4 (65784)  Problem # 3:  HIV (ICD-042) Assessment: Unchanged  Recheck VL and CD4 count in one year. Elite controller. Follow at [facility].   HIV: HIV positive - not AIDS (09/19/2007)   CD4: 1280 (04/02/2009)   WBC: 6.7 (04/01/2009)   Hgb: 11.8 (04/01/2009)   HCT: 36.1 (04/01/2009)   Platelets: 467 (04/01/2009) HIV-1 RNA: <48 copies/mL (04/01/2009)    HBSAg: NO (03/22/2006)  Orders: [facility]- Est  Level 4 (69629)  Complete Medication List: 1)  [facility] Nicotine 21 Mg/24hr Pt24 (Nicotine) .... One patch - apply to skin daily. as per instructions  Patient Instructions: 1)  It was great to see you today!  2)  Follow up in one month. 3)  Exercise by walking 4-5 times per week 30-45 minutes each time. 4)  Start using the nicotine patch to help you quit smoking.  Prescriptions: [facility] NICOTINE 21 MG/24HR PT24 (NICOTINE) one patch - apply to skin daily. as per instructions  #30 x 1   Entered and Authorized by:   Bobby Rumpf  MD   Signed by:   Bobby Rumpf  MD on 05/09/2009   Method used:   Electronically to        [facility] [address] [phone]* (retail)       [address]       [place], [place]  [address]       Ph: [phone] or [phone]       Fax: [phone]   RxID:   [id number]

## 2010-03-12 ENCOUNTER — Encounter: Payer: Self-pay | Admitting: Family Medicine

## 2010-03-12 ENCOUNTER — Ambulatory Visit (INDEPENDENT_AMBULATORY_CARE_PROVIDER_SITE_OTHER): Payer: PRIVATE HEALTH INSURANCE | Admitting: Family Medicine

## 2010-03-12 DIAGNOSIS — M545 Low back pain, unspecified: Secondary | ICD-10-CM

## 2010-03-12 DIAGNOSIS — F17200 Nicotine dependence, unspecified, uncomplicated: Secondary | ICD-10-CM

## 2010-03-12 MED ORDER — BUPROPION HCL ER (XL) 150 MG PO TB24: 150.0000 mg | ORAL_TABLET | ORAL | Status: DC

## 2010-03-12 NOTE — Patient Instructions (Signed)
It was nice to meet you today!  Good luck on stopping smoking!  Take Tylenol three times daily as needed for back pain.

## 2010-03-12 NOTE — Assessment & Plan Note (Signed)
No RED FLAGS. History c/w OA. Advised Tylenol prn pain, maintain weight, continue exercise, limit pain-inducing movements.

## 2010-03-12 NOTE — Assessment & Plan Note (Signed)
Patient ready to quit smoking and has had success with Wellbutrin in the past. Will Rx.

## 2010-03-12 NOTE — Progress Notes (Signed)
  Subjective:    Patient ID: Connie Wade, female    DOB: 05/02/1955, 55 y.o.   MRN: [id number]  HPI 1. Tobacco Dependence: Patient interested in cessation. Wants to quit on 2/22, which is one week prior to [place]. She had success with Wellbutrin in the past - thinks 100mg  but only took once daily.   2. Back Pain: Chronic. Has flares when bending/twisting/lifting more at work. Pain worse in am, but gets better after moving around for a little while. Hx OA. Not taking any medications for this issue. Not impairing function.     Review of Systems    Denies CP, SOB, N/V/D, LE edema, joint swelling, abdominal pain. Objective:   Physical Exam  Constitutional: Vital signs are normal.  Cardiovascular: Normal rate, regular rhythm and normal pulses.   Pulmonary/Chest: Effort normal and breath sounds normal.  Musculoskeletal: Normal range of motion.       Lumbar back: Normal.  Neurological: She has normal strength and normal reflexes.          Assessment & Plan:

## 2010-03-19 ENCOUNTER — Telehealth: Payer: Self-pay | Admitting: Infectious Disease

## 2010-04-03 NOTE — Progress Notes (Signed)
Summary: Pt needs FLU Shot  Phone Note Outgoing Call   Call placed by: Acey Lav MD,  March 19, 2010 4:03 PM Details for Reason: Patient needs FLU shot and can have yearly labs Summary of Call: Called and left message for patient, stating I would like to give her a flu shot and check yearly labs. She has refused flu shot in the past. Can we call her again and schedule her for flu shot. If she refuses flu shot over the phone or in person, please document Initial call taken by: Acey Lav MD,  March 19, 2010 4:03 PM  Follow-up for Phone Call        Patient is decided to get a flu vaccine and she will schedule appt. for labs and o.v Follow-up by: Starleen Arms CMA,  March 24, 2010 10:02 AM

## 2010-04-23 ENCOUNTER — Other Ambulatory Visit: Payer: PRIVATE HEALTH INSURANCE

## 2010-04-23 ENCOUNTER — Ambulatory Visit (INDEPENDENT_AMBULATORY_CARE_PROVIDER_SITE_OTHER): Payer: PRIVATE HEALTH INSURANCE | Admitting: *Deleted

## 2010-04-23 VITALS — BP 133/89 | HR 83 | Temp 98.6°F | Ht 65.0 in | Wt 154.8 lb

## 2010-04-23 DIAGNOSIS — B20 Human immunodeficiency virus [HIV] disease: Secondary | ICD-10-CM

## 2010-04-23 DIAGNOSIS — Z23 Encounter for immunization: Secondary | ICD-10-CM

## 2010-04-23 DIAGNOSIS — Z0000 Encounter for general adult medical examination without abnormal findings: Secondary | ICD-10-CM

## 2010-04-24 LAB — CBC WITH DIFFERENTIAL/PLATELET
Basophils Relative: 1 % (ref 0–1)
Eosinophils Absolute: 0.1 10*3/uL (ref 0.0–0.7)
Eosinophils Relative: 2 % (ref 0–5)
Hemoglobin: 12.3 g/dL (ref 12.0–15.0)
Lymphs Abs: 3.3 10*3/uL (ref 0.7–4.0)
MCHC: 31.9 g/dL (ref 30.0–36.0)
MCV: 75.9 fL — ABNORMAL LOW (ref 78.0–100.0)
Monocytes Absolute: 0.4 10*3/uL (ref 0.1–1.0)
Neutrophils Relative %: 51 % (ref 43–77)
Platelets: 492 10*3/uL — ABNORMAL HIGH (ref 150–400)
RBC: 5.07 MIL/uL (ref 3.87–5.11)

## 2010-04-24 LAB — COMPLETE METABOLIC PANEL WITH GFR
Chloride: 106 mEq/L (ref 96–112)
Creat: 0.68 mg/dL (ref 0.40–1.20)
GFR, Est African American: >60 mL/min (ref >60)
Glucose, Bld: 87 mg/dL (ref 70–99)
Potassium: 4.2 mEq/L (ref 3.5–5.3)
Sodium: 140 mEq/L (ref 135–145)

## 2010-04-25 LAB — HIV-1 RNA QUANT-NO REFLEX-BLD
HIV 1 RNA Quant: <20 copies/mL (ref <20)
HIV-1 RNA Quant, Log: <1.3 {Log} (ref <1.30)

## 2010-05-07 ENCOUNTER — Encounter: Payer: Self-pay | Admitting: Infectious Disease

## 2010-05-07 ENCOUNTER — Ambulatory Visit (INDEPENDENT_AMBULATORY_CARE_PROVIDER_SITE_OTHER): Payer: PRIVATE HEALTH INSURANCE | Admitting: Infectious Disease

## 2010-05-07 VITALS — BP 150/88 | HR 83 | Temp 98.9°F | Ht 61.0 in | Wt 155.0 lb

## 2010-05-07 DIAGNOSIS — I10 Essential (primary) hypertension: Secondary | ICD-10-CM

## 2010-05-07 DIAGNOSIS — B20 Human immunodeficiency virus [HIV] disease: Secondary | ICD-10-CM

## 2010-05-07 DIAGNOSIS — B1910 Unspecified viral hepatitis B without hepatic coma: Secondary | ICD-10-CM

## 2010-05-07 DIAGNOSIS — Z21 Asymptomatic human immunodeficiency virus [HIV] infection status: Secondary | ICD-10-CM

## 2010-05-07 NOTE — Assessment & Plan Note (Signed)
She is a leak controller. We'll continue to follow her at yearly intervals. I've asked her to come in the falls we make sure she has her influenza shot. I doubt she will ever need antiviral therapy.

## 2010-05-07 NOTE — Progress Notes (Signed)
  Subjective:    Patient ID: Connie Wade, female    DOB: 07-05-1955, 55 y.o.   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 She returns for routine followup visit. Her viral load are main completely undetectable and her CD4 count above thousand. She received her influenza vaccine she is up to date on her pneumonia shot. She has immunity to hepatitis B. Safe sex practices were counseled. She does have HIV negative husband who is highly unlikely contracted the infection to her given her undetectable viral load. She had no other specific complaints today. Review of Systems As per history of present illness otherwise remainder of 12 point review of systems is negative.    Objective:   Physical Exam Healthy-appearing young lady in no acute distress. HEENT normocephalic, atraumatic pupils equal round reactive to light sclerae anicteric oropharynx is clear her cardiovascular regular rhythm no murmurs tabs rubs her lungs were clear to auscultation bilaterally without wheezes rubs or rales abdomen soft nondistended nontender positive bowel sounds extremities without edema. Her neurological exam was nonfocal. She had no rashes.       Assessment & Plan:  HIV She is a leak controller. We'll continue to follow her at yearly intervals. I've asked her to come in the falls we make sure she has her influenza shot. I doubt she will ever need antiviral therapy.  HEPATITIS B She has clear hepatitis B and has simply evidence of past infection.  HYPERTENSION, BENIGN ESSENTIAL To firm and management of this to Dr. Wallene Huh

## 2010-05-07 NOTE — Patient Instructions (Signed)
COME BACK IN October FOR FLU SHOT AND REPEAT LABS

## 2010-05-07 NOTE — Assessment & Plan Note (Signed)
She has clear hepatitis B and has simply evidence of past infection.

## 2010-05-07 NOTE — Assessment & Plan Note (Signed)
To firm and management of this to Dr. Wallene Huh

## 2010-05-24 ENCOUNTER — Emergency Department (HOSPITAL_COMMUNITY)
Admission: EM | Admit: 2010-05-24 | Discharge: 2010-05-24 | Disposition: A | Discharge status: Home / Self Care | Payer: Managed Care, Other (non HMO) | Attending: Emergency Medicine | Admitting: Emergency Medicine

## 2010-05-24 DIAGNOSIS — M79609 Pain in unspecified limb: Secondary | ICD-10-CM | POA: Insufficient documentation

## 2010-05-24 DIAGNOSIS — M5430 Sciatica, unspecified side: Secondary | ICD-10-CM | POA: Insufficient documentation

## 2010-05-24 DIAGNOSIS — M549 Dorsalgia, unspecified: Secondary | ICD-10-CM | POA: Insufficient documentation

## 2010-06-13 NOTE — Op Note (Signed)
[facility]  Patient:    Connie Wade, Connie Wade                    MRN: [id number] Proc. Date: 07/18/99 Adm. Date:  [id number] Disc. Date: [id number] Attending:  Henrene Dodge CC:         Anselm Pancoast. Zachery Dakins, M.D.             Wilfrid Lund, M.D.                           Operative Report  PREOPERATIVE DIAGNOSIS:  Chronic hidradenitis, right axilla, positive HIV.  POSTOPERATIVE DIAGNOSIS:  Chronic hidradenitis, right axilla, positive HIV.  OPERATION PERFORMED:  Excision with primary closure of a large area of hidradenitis, right axilla.  SURGEON:  Anselm Pancoast. Zachery Dakins, M.D.  ANESTHESIA:  General.  ASSISTANT:  Nurse.  INDICATIONS FOR PROCEDURE:  The patient is a 55 year old female who is a positive HIV patient for probably eight or nine years who has had recurring problems with hidradenitis.  She has had multiple surgeries, most drainage procedures.  There has been one area excised previously but she has had recurring problems with chronic abscesses in the right axilla and was referred to me from family practice for management of this.  There are two fluctuant areas plus a lot of chronic indurated thickened areas and I recommended that these areas be excised with primary closure to hopefully control this chronic draining problem.  The patient is in agreement.  She has been on p.o. Keflex now for approximately a week, is better and is for the planned procedure. White blood cell count preoperatively was normal and her CMET is basically normal.  DESCRIPTION OF PROCEDURE:  The patient was taken to an operating suite, induction of general anesthesia.  The patient had been given a gram of Kefzol and then we prepped the axillary area with Betadine solution and draped her in a sterile manner.  I used a skin marker to kind of outline the thickened indurated area.  There was kind of a fold of chronic infection and then there was an area that kind of goes  down along the proximal portion of the arm so that it was going to be necessary to actually close this in kind of a T manner.  The area first was completely excised.  Skin hooks were used, first using a scalpel and then the electrocautery so all the chonic infected cavities and abscesses were removed.  I then undermined the skin in all three directions so that it could be brought together and then used a 15 round Blake drain brought under the incisions.  I closed the subcutaneous tissues with interrupted sutures of 4-0 Vicryl and then closed the skin with 3-0 simple nylon sutures.  The skin area with the arm down is not under any excessive tension and will limit her activity and release her on p.o. Keflex.  I will plan on seeing her in the office in approximately three to four days for a wound check and will hopefully will be able to remove the drain at that time. DD:  07/18/99 TD:  07/21/99 Job: [id number]

## 2010-06-27 ENCOUNTER — Encounter: Payer: Self-pay | Admitting: Family Medicine

## 2010-06-27 ENCOUNTER — Ambulatory Visit (INDEPENDENT_AMBULATORY_CARE_PROVIDER_SITE_OTHER): Payer: PRIVATE HEALTH INSURANCE | Admitting: Family Medicine

## 2010-06-27 DIAGNOSIS — R0789 Other chest pain: Secondary | ICD-10-CM | POA: Insufficient documentation

## 2010-06-27 DIAGNOSIS — F17200 Nicotine dependence, unspecified, uncomplicated: Secondary | ICD-10-CM

## 2010-06-27 DIAGNOSIS — R071 Chest pain on breathing: Secondary | ICD-10-CM

## 2010-06-27 NOTE — Progress Notes (Signed)
  Subjective:    Patient ID: Connie Wade, female    DOB: [birth year], 55 y.o.   MRN: [id number]  HPI  1) Chest pain: Sharp. Localized to upper sternum without radiation. Can last all day. Intermittent. Started one week ago. Has not had this before. Not affected by position. Non exertional. Not relieved by rest. Seems to be brought on by stress (she has been dealing with trying to get her husband [EtOH abuse, drug abuse] into rehab over the past week. Pain can be elicited by pushing on site of pain. Unsure brought on by foods (though she does eat a lot of spicy and greasy foods), or caffeine. Relieved by ibuprofen.   2) Tobacco use: Had quit successfully with nicotine patches at last visit, but has restarted with recent stress. Did not like patches because of rash. Denies chronic cough. Husband also smoker.  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 Daiva Eves), history of tobacco use.   Denies trauma, heavy lifting, fever, chills, weight loss, nausea, emesis, sour taste in mouth, hemoptysis, hematemesis, non-productive cough, productive cough, lethargy, radiation of pain, recent URI symptoms, rash, dyspnea, wheeze, leg pain or swelling   Review of Systems As per HPI     Objective:   Physical Exam General: well appearing, pleasant, NAD  Mouth: no oral lesions, moist membranes, no thrush  Neck: no LAD  Chest: tender to palpation upper sternum; no obvious deformity Heart: regular rate and rhythm, no murmurs  Lungs: clear to auscultation bilaterally without wheeze or rales  Abdomen: soft, non tender, non distended Skin: no rash  Musculoskeletal: no pain with resisted internal / external rotation at shoulders     Assessment & Plan:

## 2010-06-27 NOTE — Assessment & Plan Note (Signed)
Deteriorated. Patient pre-contemplative. Would like to wait until she is under less stress to quit. Advised to come win when ready to discuss quitting. Would consider oral nicotine replacement given success with nicotine replacement in past (though does not want patches due to rash)

## 2010-06-27 NOTE — Patient Instructions (Signed)
Try ibuprofen for your pain - I think this pain is related to your muscles and is coming from stress. When you are ready to quit smoking come back! I have enjoyed being your physician. Best wishes!  - Dr. Wallene Huh

## 2010-06-27 NOTE — Assessment & Plan Note (Addendum)
NEW PROBLEM. Likely source given patient's presentation and exam. No red flags noted. Non-exertional. Not relieved by rest. No dyspnea. No systemic symptoms. Possibly element of GERD as well - advised trial of avoiding greasy or spicy foods, caffeine. Follow up as needed. Reviewed red flag symptoms that would prompt return to care. Advised NSAID as needed for pain.

## 2010-07-25 ENCOUNTER — Emergency Department (HOSPITAL_COMMUNITY): Payer: 59

## 2010-07-25 ENCOUNTER — Emergency Department (HOSPITAL_COMMUNITY)
Admission: EM | Admit: 2010-07-25 | Discharge: 2010-07-26 | Disposition: A | Discharge status: Home / Self Care | Payer: 59 | Attending: Emergency Medicine | Admitting: Emergency Medicine

## 2010-07-25 DIAGNOSIS — R059 Cough, unspecified: Secondary | ICD-10-CM | POA: Insufficient documentation

## 2010-07-25 DIAGNOSIS — F17200 Nicotine dependence, unspecified, uncomplicated: Secondary | ICD-10-CM | POA: Insufficient documentation

## 2010-07-25 DIAGNOSIS — R05 Cough: Secondary | ICD-10-CM | POA: Insufficient documentation

## 2010-07-25 DIAGNOSIS — J069 Acute upper respiratory infection, unspecified: Secondary | ICD-10-CM | POA: Insufficient documentation

## 2010-07-25 DIAGNOSIS — R0602 Shortness of breath: Secondary | ICD-10-CM | POA: Insufficient documentation

## 2010-10-14 ENCOUNTER — Encounter: Payer: PRIVATE HEALTH INSURANCE | Admitting: Family Medicine

## 2010-10-21 LAB — T-HELPER CELL (CD4) - (RCID CLINIC ONLY): CD4 % Helper T Cell: 39

## 2010-10-24 LAB — T-HELPER CELL (CD4) - (RCID CLINIC ONLY)
CD4 % Helper T Cell: 43
CD4 T Cell Abs: 1220

## 2010-11-04 ENCOUNTER — Encounter: Payer: PRIVATE HEALTH INSURANCE | Admitting: Family Medicine

## 2010-11-12 ENCOUNTER — Ambulatory Visit (INDEPENDENT_AMBULATORY_CARE_PROVIDER_SITE_OTHER): Payer: Commercial Indemnity | Admitting: Family Medicine

## 2010-11-12 ENCOUNTER — Encounter: Payer: Self-pay | Admitting: Family Medicine

## 2010-11-12 VITALS — BP 154/95 | HR 88 | Temp 98.5°F | Ht 61.5 in | Wt 144.2 lb

## 2010-11-12 DIAGNOSIS — F329 Major depressive disorder, single episode, unspecified: Secondary | ICD-10-CM

## 2010-11-12 DIAGNOSIS — I10 Essential (primary) hypertension: Secondary | ICD-10-CM

## 2010-11-12 DIAGNOSIS — B1910 Unspecified viral hepatitis B without hepatic coma: Secondary | ICD-10-CM

## 2010-11-12 DIAGNOSIS — F17200 Nicotine dependence, unspecified, uncomplicated: Secondary | ICD-10-CM

## 2010-11-12 DIAGNOSIS — E785 Hyperlipidemia, unspecified: Secondary | ICD-10-CM

## 2010-11-12 DIAGNOSIS — B20 Human immunodeficiency virus [HIV] disease: Secondary | ICD-10-CM

## 2010-11-12 MED ORDER — BUPROPION HCL ER (XL) 150 MG PO TB24: 150.0000 mg | ORAL_TABLET | ORAL | Status: DC

## 2010-11-12 NOTE — Patient Instructions (Signed)
Very nice to meet she dear I am sending you in a refill for your Wellbutrin start taking one pill daily I want you to come back fasting in for labs drawn when you have time we'll get up at 8:30 AM I will call you with the results. I when she to come back in 4 weeks

## 2010-11-12 NOTE — Progress Notes (Signed)
  Subjective:    Patient ID: Connie Wade, female    DOB: Dec 01, 1955, 55 y.o.   MRN: [id number]  HPI Patient is here for her complete physical exam. Patient hasn't mild ankle pain on the right side usually when she's in her dress shoes only seems to be on the dorsal aspect of the foot does not stop her from doing anything but does make him sit more at work. She is working at [employer] and stabilized denies any swelling and discoloration still able to walk at all times.  Patient though has been feeling a little more depressed. She stopped her Wellbutrin quite some time ago. Patient though is dealing with her husband who is very sick at home chronic alcoholism that has lead to cirrhosis and sounds to be near the end of his life. Patient has not been taking care of herself a year she states not eating as well as she used to and this has been feeling much more down. Patient is also smoking one pack per day which is up from her usual of approximately 1/2 pack patient to been discussing her feelings would like to start Wellbutrin again  History of HIV and hepatitis B. Patient is followed by Dr. Algis Liming states that has not seen him since April at that time Lab Results  Component Value Date   CD4TCELL 42 04/23/2010   patient was put on any medications total CD4 count is 1350    Hypertension Blood pressure at home:not checking Blood pressure today: 140/90 on recheck Taking Meds: Side effects: ROS: Denies headache visual changes nausea, vomiting, chest pain or abdominal pain or shortness of breath.   Review of Systems Denies fevers chills nausea vomiting diarrhea constipation abdominal pain chest pain shortness of breath or weakness in any extremities or cough Past medical history reviewed with no changes    Objective:   Physical Exam General: No apparent distress mildly overweight African American female Cardiovascular regular rate and rhythm no murmur Pulmonary: Clear to auscultation  bilaterally with mild coarse breath sounds throughout Abdomen bowel sounds positive nontender nondistended no organomegaly noted Extremities no swelling patient's right ankle has full range of motion nontender at this time no swelling patient's dorsal aspect of the foot is doing very well patient has a mild hallux rigidus    Assessment & Plan:

## 2010-11-12 NOTE — Assessment & Plan Note (Signed)
Still smoking but will be continued on Wellbutrin now. Patient denies any type of seizure disorder

## 2010-11-12 NOTE — Assessment & Plan Note (Signed)
Patient is due for fasting lipid panel she also has forms for work that needs this information filled out so she can decrease health insurance rate

## 2010-11-12 NOTE — Assessment & Plan Note (Signed)
Patient had a viral load checked at this time patient can followup with infectious disease as scheduled

## 2010-11-12 NOTE — Assessment & Plan Note (Signed)
Patient denies any suicidal or homicidal ideation we'll restart her Wellbutrin 150 mg would not titrate up because patient has been on it before and did well. Hopefully this will also help with her smoking patient will return in 4 weeks to see how she is doing declined wanted to talk to any psychologist

## 2010-11-12 NOTE — Assessment & Plan Note (Signed)
We'll check patient's CD4 count as well as HIV load patient is followed by infectious disease but do to try and blood any how will get levels patient is due

## 2010-11-12 NOTE — Assessment & Plan Note (Signed)
>>  ASSESSMENT AND PLAN FOR HLD (HYPERLIPIDEMIA) WRITTEN ON 11/12/2010  4:21 PM BY Antoine Primas M, DO  Patient is due for fasting lipid panel she also has forms for work that needs this information filled out so she can decrease health insurance rate

## 2010-11-12 NOTE — Assessment & Plan Note (Signed)
Still not at goal at this time. Patient is very against using medications if possible. We'll give patient a trial for the next 4 weeks of diet and exercise discussed diet options increasing vegetables and fruits as well as walking 30 minutes daily and decreasing salt intake if patient is still elevated at that time we need to consider medication patient will have a been a drawn today

## 2011-04-27 ENCOUNTER — Ambulatory Visit (INDEPENDENT_AMBULATORY_CARE_PROVIDER_SITE_OTHER): Payer: Commercial Indemnity | Admitting: Family Medicine

## 2011-04-27 ENCOUNTER — Encounter: Payer: Self-pay | Admitting: Family Medicine

## 2011-04-27 VITALS — BP 138/86 | HR 80 | Temp 98.8°F | Ht 61.0 in | Wt 144.0 lb

## 2011-04-27 DIAGNOSIS — B1910 Unspecified viral hepatitis B without hepatic coma: Secondary | ICD-10-CM

## 2011-04-27 DIAGNOSIS — E559 Vitamin D deficiency, unspecified: Secondary | ICD-10-CM

## 2011-04-27 DIAGNOSIS — I10 Essential (primary) hypertension: Secondary | ICD-10-CM

## 2011-04-27 DIAGNOSIS — Z21 Asymptomatic human immunodeficiency virus [HIV] infection status: Secondary | ICD-10-CM

## 2011-04-27 DIAGNOSIS — M62838 Other muscle spasm: Secondary | ICD-10-CM | POA: Insufficient documentation

## 2011-04-27 DIAGNOSIS — F17200 Nicotine dependence, unspecified, uncomplicated: Secondary | ICD-10-CM

## 2011-04-27 DIAGNOSIS — N951 Menopausal and female climacteric states: Secondary | ICD-10-CM

## 2011-04-27 DIAGNOSIS — E785 Hyperlipidemia, unspecified: Secondary | ICD-10-CM

## 2011-04-27 DIAGNOSIS — M549 Dorsalgia, unspecified: Secondary | ICD-10-CM

## 2011-04-27 DIAGNOSIS — B20 Human immunodeficiency virus [HIV] disease: Secondary | ICD-10-CM

## 2011-04-27 MED ORDER — HYDROXYZINE HCL 25 MG PO TABS: 25.0000 mg | ORAL_TABLET | Freq: Three times a day (TID) | ORAL | Status: AC | PRN

## 2011-04-27 MED ORDER — KETOROLAC TROMETHAMINE 60 MG/2ML IM SOLN
60.0000 mg | Freq: Once | INTRAMUSCULAR | Status: AC
  Administered 2011-04-27: 60 mg via INTRAMUSCULAR

## 2011-04-27 MED ORDER — CYCLOBENZAPRINE HCL 5 MG PO TABS: 5.0000 mg | ORAL_TABLET | Freq: Three times a day (TID) | ORAL | Status: DC | PRN

## 2011-04-27 MED ORDER — DEXAMETHASONE SODIUM PHOSPHATE 10 MG/ML IJ SOLN
10.0000 mg | Freq: Once | INTRAMUSCULAR | Status: AC
  Administered 2011-04-27: 10 mg via INTRAMUSCULAR

## 2011-04-27 NOTE — Patient Instructions (Addendum)
It is good to see you. I am giving you to injections for your muscle spasm in her neck today. I really encourage you to followup with infectious disease. I will get labs and call you with the results. I am giving you to medications. One is Flexeril take one pill up to 3 times a day for muscle spasm The other medication is called hydroxyzine you use this one up to 3 times a day for anxiety. You should come back in 4-6 weeks to recheck your blood pressure Also up front make an appointment with Dr. Raymondo Band to stop smoking.

## 2011-04-27 NOTE — Assessment & Plan Note (Signed)
It has been a year we will get complete metabolic panel. Patient still is minorly elevated and declines taking any medication at this time. Patient will try to control her blood pressure with diet and exercises will return for recheck in 4-6 weeks. Patient understands the risks of having uncontrolled hypertension and still declines medications.

## 2011-04-27 NOTE — Assessment & Plan Note (Signed)
Encourage patient to see infectious disease in the near future. We'll get HIV with viral load today.

## 2011-04-27 NOTE — Assessment & Plan Note (Signed)
Treated with Toradol as well as Decadron today. Patient will do anti-inflammatories for 3 days been given exercises to start. Do think likely due to her increased work load at home as well as her anxiety.

## 2011-04-27 NOTE — Assessment & Plan Note (Signed)
Encourage patient to followup with infectious disease doctors. We'll not get labs but will get many other types of labs today. Patient has been noncompliant in the past but did not feel that the labs we change her to get being treated.

## 2011-04-27 NOTE — Assessment & Plan Note (Signed)
Patient contributes this to her night sweats. Encourage her to try over-the-counter black cohosh if continues she can return. Would not give any estrogen or hormone replacement therapy in this individual.

## 2011-04-27 NOTE — Progress Notes (Signed)
  Subjective:    Patient ID: Connie Wade, female    DOB: 02-26-1955, 56 y.o.   MRN: [id number]  HPI Right shoulder and back pain-patient states that this started approximately 2 weeks ago. Patient denies any injury or any type of fall. Patient's low back pain has been a chronic problem but does respond very well ibuprofen. Patient shoulder pain though is new but denies any radiation of pain or numbness or weakness in the arm. Patient states it hurts somewhat with deep inspiration but does not impede her regular activities of daily living.  Night sweats-patient contributes this more to her menopause anything else. Patient has not taken any over-the-counter medications. Patient denies any fever or, denies any weight loss but does have history of HIV.  History of hepatitis B and HIV- patient has not followed up with infectious disease for over a year. Patient states that she sees him on a yearly basis it is making to schedule appointment here in the near future. Patient states that she has had some night sweats from time to time otherwise no weight loss no fevers no chills no nausea no vomiting no skin lesions of any sort no history of any upper respiratory type infections recently.  Hypertension Blood pressure at home: Not checking Blood pressure today: 138/80 Taking Meds: No Side effects: No ROS: Denies headache visual changes nausea, vomiting, chest pain or abdominal pain or shortness of breath.  Anxiety-patient states that her husband has recently been diagnosed with cancer and is being treated with chemotherapy. Patient states that this has caused her to do many more things around the house which has caused her great anxiety and fatigue. Patient denies any suicidal homicidal or depressive like symptoms. Patient does state that she has some insomnia secondary to anxiety as well. Patient had been on Wellbutrin before to help her with tobacco cessation as well as her mood but did not like how it  made her feel so she stopped this previously.   Review of Systems As stated in history of present illness    Objective:   Physical Exam Filed Vitals:   04/27/11 1019  Height: 5\' 1"  (1.549 m)  Weight: 144 lb (65.318 kg)    General: No apparent distress mildly overweight African American female Cardiovascular regular rate and rhythm no murmur Pulmonary: Clear to auscultation bilaterally with mild coarse breath sounds throughout Abdomen bowel sounds positive nontender nondistended no organomegaly noted Extremities no swelling  Muscle skeletal: Patient does have a trapezius muscle spasm on the right side of her neck no gross abnormalities rotator cuff intact full strength negative Neer, Hawkings and empty can sign.     Assessment & Plan:

## 2011-04-27 NOTE — Assessment & Plan Note (Signed)
Patient still smoking one pack per day. Patient encouraged to see Dr. Raymondo Band in near future to help with smoking cessation. Patient was on Wellbutrin and patches before which did help her stop the patient is now a take Wellbutrin again. Patient does have a history of depression so did not know if Chantix would be available to her.

## 2011-04-28 LAB — T-HELPER CELLS (CD4) COUNT (NOT AT ARMC)
CD4 T Helper %: 48 % (ref 32–62)
Total Lymphocyte: 41 % (ref 12–46)
Total lymphocyte count: 2460 /uL (ref 700–3300)
WBC, lymph enumeration: 6 10*3/uL (ref 4.0–10.5)

## 2011-04-28 LAB — LIPID PANEL
Cholesterol: 177 mg/dL (ref 0–200)
HDL: 59 mg/dL (ref >39)
Triglycerides: 100 mg/dL (ref <150)

## 2011-04-28 LAB — COMPREHENSIVE METABOLIC PANEL
Albumin: 4.3 g/dL (ref 3.5–5.2)
BUN: 11 mg/dL (ref 6–23)
CO2: 23 mEq/L (ref 19–32)
Calcium: 9.9 mg/dL (ref 8.4–10.5)
Chloride: 106 mEq/L (ref 96–112)
Glucose, Bld: 89 mg/dL (ref 70–99)
Potassium: 4.3 mEq/L (ref 3.5–5.3)

## 2011-04-28 LAB — CBC WITH DIFFERENTIAL/PLATELET
Hemoglobin: 12.5 g/dL (ref 12.0–15.0)
Lymphs Abs: 2.5 10*3/uL (ref 0.7–4.0)
MCH: 24.7 pg — ABNORMAL LOW (ref 26.0–34.0)
Monocytes Relative: 5 % (ref 3–12)
Neutro Abs: 3.1 10*3/uL (ref 1.7–7.7)
Neutrophils Relative %: 51 % (ref 43–77)
RBC: 5.07 MIL/uL (ref 3.87–5.11)

## 2011-04-28 LAB — RPR

## 2011-04-28 LAB — VITAMIN D 25 HYDROXY (VIT D DEFICIENCY, FRACTURES): Vit D, 25-Hydroxy: 16 ng/mL — ABNORMAL LOW (ref 30–89)

## 2011-04-29 ENCOUNTER — Telehealth: Payer: Self-pay | Admitting: Family Medicine

## 2011-04-29 LAB — HIV-1 RNA QUANT-NO REFLEX-BLD: HIV-1 RNA Quant, Log: <1.3 {Log} (ref <1.30)

## 2011-04-29 MED ORDER — ERGOCALCIFEROL 1.25 MG (50000 UT) PO CAPS: 50000.0000 [IU] | ORAL_CAPSULE | ORAL | Status: AC

## 2011-04-29 NOTE — Telephone Encounter (Signed)
Left message for patient stating that all labs are normal except vitamin D. we'll send him ergocalciferol 50,000 units weekly for the next 12 weeks.

## 2011-05-01 LAB — HEPATITIS B DNA, ULTRAQUANTITATIVE, PCR: Hepatitis B DNA: NOT DETECTED IU/mL (ref <20)

## 2011-06-11 ENCOUNTER — Other Ambulatory Visit: Payer: Self-pay | Admitting: Gastroenterology

## 2011-06-23 ENCOUNTER — Encounter: Payer: Self-pay | Admitting: Family Medicine

## 2011-06-23 DIAGNOSIS — D126 Benign neoplasm of colon, unspecified: Secondary | ICD-10-CM | POA: Insufficient documentation

## 2011-06-23 HISTORY — DX: Benign neoplasm of colon, unspecified: D12.6

## 2011-07-08 ENCOUNTER — Telehealth: Payer: Self-pay | Admitting: *Deleted

## 2011-07-08 NOTE — Telephone Encounter (Signed)
Message left asking pt to call [facility] to make lab, MD f/u visits.  Pt also needs PAP smear.  Last recorded PAP smear was 05/2009.

## 2011-07-31 ENCOUNTER — Ambulatory Visit: Payer: Commercial Indemnity | Admitting: Family Medicine

## 2011-10-29 ENCOUNTER — Encounter: Payer: Self-pay | Admitting: Sports Medicine

## 2011-10-29 ENCOUNTER — Ambulatory Visit (INDEPENDENT_AMBULATORY_CARE_PROVIDER_SITE_OTHER): Payer: Commercial Indemnity | Admitting: Sports Medicine

## 2011-10-29 VITALS — BP 151/82 | HR 78 | Temp 98.5°F | Ht 61.75 in | Wt 145.0 lb

## 2011-10-29 DIAGNOSIS — B20 Human immunodeficiency virus [HIV] disease: Secondary | ICD-10-CM

## 2011-10-29 DIAGNOSIS — T148 Other injury of unspecified body region: Secondary | ICD-10-CM

## 2011-10-29 DIAGNOSIS — W57XXXA Bitten or stung by nonvenomous insect and other nonvenomous arthropods, initial encounter: Secondary | ICD-10-CM | POA: Insufficient documentation

## 2011-10-29 MED ORDER — TRIAMCINOLONE ACETONIDE 0.5 % EX OINT: TOPICAL_OINTMENT | Freq: Two times a day (BID) | CUTANEOUS | Status: DC

## 2011-10-29 NOTE — Progress Notes (Signed)
  [facility]  Patient name: MINTIE WITHERINGTON MRN [id number]  Date of birth: 1955-03-10  CC & HPI:  Connie Wade is a 56 y.o. female presenting today for evaluation of rash.  Reports starting mid September she developed a red, raised and itchy rash on her R leg that evolved to her R arm and R nape of neck.  Occasionally has had a bite on her forehead.  No fevers, no chills.  Has not followed up with her PCP, Gastroenterologist, or HIV doctors.  ROS:  Per HPI  Pertinent History Reviewed:  Medical & Surgical Hx:  Reviewed: Significant for HIV, tubular adenomas without malignancy on Colonoscopy Medications: Reviewed & Updated - see associated section Social History: Reviewed - Significant for current everyday smoker  Objective Findings:  Vitals:  Filed Vitals:   10/29/11 0838  BP: 151/82  Pulse: 78  Temp: 98.5 F (36.9 C)    PE: GENERAL:  Adult AA female. In no discomfort; no respiratory distress. PSYCH: Alert and appropriately interactive; Insight:Lacking   Skin:    Assessment & Plan:

## 2011-10-29 NOTE — Patient Instructions (Addendum)
I have sent in a steroid cream. Follow up with your Primary Doctor as well as your ID doctors ASAP  Insect Bite Mosquitoes, flies, fleas, bedbugs, and many other insects can bite. Insect bites are different from insect stings. A sting is when venom is injected into the skin. Some insect bites can transmit infectious diseases. SYMPTOMS  Insect bites usually turn red, swell, and itch for 2 to 4 days. They often go away on their own. TREATMENT  Your caregiver may prescribe antibiotic medicines if a bacterial infection develops in the bite. HOME CARE INSTRUCTIONS  Do not scratch the bite area.  Keep the bite area clean and dry. Wash the bite area thoroughly with soap and water.  Put ice or cool compresses on the bite area.  Put ice in a plastic bag.  Place a towel between your skin and the bag.  Leave the ice on for 20 minutes, 4 times a day for the first 2 to 3 days, or as directed.  You may apply a baking soda paste, cortisone cream, or calamine lotion to the bite area as directed by your caregiver. This can help reduce itching and swelling.  Only take over-the-counter or prescription medicines as directed by your caregiver.  If you are given antibiotics, take them as directed. Finish them even if you start to feel better. You may need a tetanus shot if:  You cannot remember when you had your last tetanus shot.  You have never had a tetanus shot.  The injury broke your skin. If you get a tetanus shot, your arm may swell, get red, and feel warm to the touch. This is common and not a problem. If you need a tetanus shot and you choose not to have one, there is a rare chance of getting tetanus. Sickness from tetanus can be serious. SEEK IMMEDIATE MEDICAL CARE IF:   You have increased pain, redness, or swelling in the bite area.  You see a red line on the skin coming from the bite.  You have a fever.  You have joint pain.  You have a headache or neck pain.  You have unusual  weakness.  You have a rash.  You have chest pain or shortness of breath.  You have abdominal pain, nausea, or vomiting.  You feel unusually tired or sleepy. MAKE SURE YOU:   Understand these instructions.  Will watch your condition.  Will get help right away if you are not doing well or get worse. Document Released: 02/20/2004 Document Revised: 04/06/2011 Document Reviewed: 08/13/2010 [facility] Patient Information 2013 [place], [place].

## 2011-10-29 NOTE — Assessment & Plan Note (Signed)
Likely source of rash.  Bug eradication discussed with pt. Rx for steroid cream Continue benadryl topically and/or PO for sx relief

## 2011-11-05 NOTE — Assessment & Plan Note (Signed)
Emphasized follow up with ID I do not suspect this is secondary to her disease but consider if not improving with typical treatment

## 2011-11-16 ENCOUNTER — Ambulatory Visit (INDEPENDENT_AMBULATORY_CARE_PROVIDER_SITE_OTHER): Payer: Commercial Indemnity | Admitting: Family Medicine

## 2011-11-16 ENCOUNTER — Other Ambulatory Visit (HOSPITAL_COMMUNITY)
Admission: RE | Admit: 2011-11-16 | Discharge: 2011-11-16 | Disposition: A | Discharge status: Home / Self Care | Payer: Commercial Indemnity | Source: Ambulatory Visit | Attending: Family Medicine | Admitting: Family Medicine

## 2011-11-16 ENCOUNTER — Encounter: Payer: Self-pay | Admitting: Family Medicine

## 2011-11-16 VITALS — BP 131/84 | HR 82 | Temp 98.4°F | Ht 61.0 in | Wt 147.0 lb

## 2011-11-16 DIAGNOSIS — M545 Low back pain, unspecified: Secondary | ICD-10-CM

## 2011-11-16 DIAGNOSIS — Z23 Encounter for immunization: Secondary | ICD-10-CM

## 2011-11-16 DIAGNOSIS — Z124 Encounter for screening for malignant neoplasm of cervix: Secondary | ICD-10-CM

## 2011-11-16 DIAGNOSIS — Z01419 Encounter for gynecological examination (general) (routine) without abnormal findings: Secondary | ICD-10-CM

## 2011-11-16 DIAGNOSIS — T148 Other injury of unspecified body region: Secondary | ICD-10-CM

## 2011-11-16 DIAGNOSIS — W57XXXA Bitten or stung by nonvenomous insect and other nonvenomous arthropods, initial encounter: Secondary | ICD-10-CM

## 2011-11-16 MED ORDER — CYCLOBENZAPRINE HCL 5 MG PO TABS: 5.0000 mg | ORAL_TABLET | Freq: Three times a day (TID) | ORAL | Status: DC | PRN

## 2011-11-16 NOTE — Assessment & Plan Note (Signed)
Still feel this is bed bugs- advised she must clear home/bed of them to get rid of them.

## 2011-11-16 NOTE — Assessment & Plan Note (Signed)
Pap done today Flu shot today Encouraged her to get Mammogram, hand out with phone number given Encouraged her to schedule ID follow up.

## 2011-11-16 NOTE — Patient Instructions (Signed)
It was nice to meet you.  I will send you a letter with your pap results.  Please try the flexeril for your back pain.   Please be sure to call the Infectious Disease Clinic to schedule an appointment.   Also, please call the Breast Imaging center to have your mammogram scheduled.

## 2011-11-16 NOTE — Progress Notes (Signed)
  Subjective:    Patient ID: Connie Wade, female    DOB: December 31, 1955, 56 y.o.   MRN: [id number]  HPI  Connie Wade comes in for her well woman exam.    She continues to complain of itching.  She says she thinks she has bed bugs.  The triamcinolone ointment helps, but she is not taking benadryl.  She has tried spraying to get rid of the bugs but she keeps getting itchy again.   She also complains of some back pain. She has had no new injury nor can she think of any new activities.  She denies numbness, weakness of LE, denies incontinence.    Health Maintenance:  - last pap in 2011, per ID clinic patient with HIV needs yearly pap - Due for flu shot - due for mammogram - up to date on other immunizations - up to date on colonoscopy - Needs to schedule ID follow up.   Review of Systems See HPI    Objective:   Physical Exam BP 131/84  Pulse 82  Temp 98.4 F (36.9 C) (Oral)  Ht 5\' 1"  (1.549 m)  Wt 147 lb (66.679 kg)  BMI 27.78 kg/m2 General appearance: alert, cooperative and no distress Neck: no adenopathy, no JVD, supple, symmetrical, trachea midline and thyroid not enlarged, symmetric, no tenderness/mass/nodules Back: symmetric, no curvature. ROM normal. No CVA tenderness., Mild left paraspinal muscle tenderness, LE strength, sensation, reflexes normal. Lungs: clear to auscultation bilaterally Heart: regular rate and rhythm, S1, S2 normal, no murmur, click, rub or gallop Pelvic: cervix normal in appearance, external genitalia normal, no adnexal masses or tenderness, no cervical motion tenderness, rectovaginal septum normal, uterus normal size, shape, and consistency and vagina normal without discharge       Assessment & Plan:

## 2011-11-16 NOTE — Assessment & Plan Note (Signed)
Recurrent, no red flags,  Rx for flexeril, tylenol PRN, advised heat, ice, stretching.

## 2011-11-18 ENCOUNTER — Other Ambulatory Visit: Payer: Self-pay | Admitting: Family Medicine

## 2011-11-18 DIAGNOSIS — Z1231 Encounter for screening mammogram for malignant neoplasm of breast: Secondary | ICD-10-CM

## 2011-12-22 ENCOUNTER — Ambulatory Visit
Admission: RE | Admit: 2011-12-22 | Discharge: 2011-12-22 | Disposition: A | Discharge status: Home / Self Care | Payer: Commercial Indemnity | Source: Ambulatory Visit | Attending: Family Medicine | Admitting: Family Medicine

## 2011-12-22 ENCOUNTER — Ambulatory Visit: Payer: Commercial Indemnity

## 2011-12-22 DIAGNOSIS — Z1231 Encounter for screening mammogram for malignant neoplasm of breast: Secondary | ICD-10-CM

## 2011-12-28 ENCOUNTER — Encounter: Payer: Self-pay | Admitting: Home Health Services

## 2012-01-12 ENCOUNTER — Other Ambulatory Visit: Payer: Self-pay | Admitting: Licensed Clinical Social Worker

## 2012-01-12 ENCOUNTER — Other Ambulatory Visit (INDEPENDENT_AMBULATORY_CARE_PROVIDER_SITE_OTHER): Payer: Commercial Indemnity

## 2012-01-12 DIAGNOSIS — B20 Human immunodeficiency virus [HIV] disease: Secondary | ICD-10-CM

## 2012-01-12 LAB — COMPLETE METABOLIC PANEL WITH GFR
Albumin: 4 g/dL (ref 3.5–5.2)
Alkaline Phosphatase: 62 U/L (ref 39–117)
BUN: 11 mg/dL (ref 6–23)
CO2: 25 mEq/L (ref 19–32)
GFR, Est African American: >89 mL/min
GFR, Est Non African American: >89 mL/min
Glucose, Bld: 79 mg/dL (ref 70–99)
Potassium: 4.7 mEq/L (ref 3.5–5.3)
Total Bilirubin: 0.3 mg/dL (ref 0.3–1.2)

## 2012-01-12 LAB — CBC WITH DIFFERENTIAL/PLATELET
Basophils Absolute: 0.1 10*3/uL (ref 0.0–0.1)
Basophils Relative: 1 % (ref 0–1)
Eosinophils Absolute: 0.1 10*3/uL (ref 0.0–0.7)
Lymphs Abs: 2.6 10*3/uL (ref 0.7–4.0)
MCH: 24.8 pg — ABNORMAL LOW (ref 26.0–34.0)
MCHC: 32.8 g/dL (ref 30.0–36.0)
Neutrophils Relative %: 46 % (ref 43–77)
Platelets: 502 10*3/uL — ABNORMAL HIGH (ref 150–400)
RBC: 5.01 MIL/uL (ref 3.87–5.11)
RDW: 19.2 % — ABNORMAL HIGH (ref 11.5–15.5)

## 2012-01-12 LAB — LIPID PANEL: Cholesterol: 166 mg/dL (ref 0–200)

## 2012-01-13 LAB — T-HELPER CELL (CD4) - (RCID CLINIC ONLY)
CD4 % Helper T Cell: 41 % (ref 33–55)
CD4 T Cell Abs: 1080 uL (ref 400–2700)

## 2012-01-14 LAB — HIV-1 RNA QUANT-NO REFLEX-BLD: HIV 1 RNA Quant: <20 copies/mL (ref <20)

## 2012-01-25 ENCOUNTER — Encounter: Payer: Self-pay | Admitting: Family Medicine

## 2012-01-25 ENCOUNTER — Ambulatory Visit (INDEPENDENT_AMBULATORY_CARE_PROVIDER_SITE_OTHER): Payer: Commercial Indemnity | Admitting: Family Medicine

## 2012-01-25 VITALS — BP 152/92 | HR 90 | Temp 98.4°F | Ht 61.0 in | Wt 148.0 lb

## 2012-01-25 DIAGNOSIS — M545 Low back pain, unspecified: Secondary | ICD-10-CM

## 2012-01-25 NOTE — Assessment & Plan Note (Signed)
Will obtain lumbar films as this has been going on for >6 months.  Discussed importance of exercise, home exercise program, heat.  Advised to continue ibuprofen and tylenol, and use tramadol for severe pain.  Offered referral to PT, but patient declines due to time restraints.

## 2012-01-25 NOTE — Patient Instructions (Signed)
I am sorry your back has been hurting you so much.  You can continue to take tylenol and ibuprofen for your back, if you have tramadol at home you can use that when it is really hurting.    I want you to start doing the stretches and strengthening exercises for your back- do them at least once a day, and twice a day when you have time.  Also, please try using a heating pad on your back, 20 minutes at a time, a few times a day.    Please go get the x-rays of your back taken, I will call you when I see the results.

## 2012-01-25 NOTE — Progress Notes (Signed)
  Subjective:    Patient ID: Connie Wade, female    DOB: 01-21-1956, 56 y.o.   MRN: [id number]  HPI  Connie Wade comes in due to worsening of her chronic back pain.  She has been very busy over the holidays, working a lot, and her back has been hurting her.  She says the pain is in the right lumbar area, but she also has tightness in her neck.  She denies pain shooting down her leg, incontinence, weakness, numbness or tingling in LE.  She has been taking flexeril, ibuprofen and tylenol for the pain, but does not like taking pills.  These medications only help a little.  Has tramadol at home is not taking it.  Was given home exercise program for low back, has not been doing the stretches and exercise.    Review of Systems Pertinent items in HPI    Objective:   Physical Exam BP 152/92  Pulse 90  Temp 98.4 F (36.9 C)  Ht 5\' 1"  (1.549 m)  Wt 148 lb (67.132 kg)  BMI 27.96 kg/m2 General appearance: alert, cooperative and no distress Back: Normal Curvature, no deformities or CVA tenderness  ROM full in all directions  Paraspinal Tenderness: in lumbar area R>L, no tenderness over spine  LE Strength 5/5  LE Sensation: in tact  LE Reflexes 2+ and symmetric         Assessment & Plan:

## 2012-02-01 ENCOUNTER — Ambulatory Visit (INDEPENDENT_AMBULATORY_CARE_PROVIDER_SITE_OTHER): Payer: Commercial Indemnity | Admitting: Infectious Disease

## 2012-02-01 ENCOUNTER — Encounter: Payer: Self-pay | Admitting: Infectious Disease

## 2012-02-01 VITALS — BP 138/82 | HR 78 | Temp 98.5°F | Ht 61.0 in | Wt 147.0 lb

## 2012-02-01 DIAGNOSIS — F329 Major depressive disorder, single episode, unspecified: Secondary | ICD-10-CM

## 2012-02-01 DIAGNOSIS — F32A Depression, unspecified: Secondary | ICD-10-CM

## 2012-02-01 DIAGNOSIS — B20 Human immunodeficiency virus [HIV] disease: Secondary | ICD-10-CM

## 2012-02-01 NOTE — Progress Notes (Signed)
  Subjective:    Patient ID: Connie Wade, female    DOB: 1955-12-23, 57 y.o.   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 She returns for routine followup visit. Her viral load are main completely undetectable and her CD4 count above thousand. She is under considerable stress due to her husband's illnesses. He has been dx with lung cancer, has had NHL, and has SLE and CKD. He is going to be travelling to [facility] for chemotherapy. She DOES endorse depressive ssx but denies SI or HI. She is off wellbutrin but does not yet want to restart this. We discussed nature of HIV and specifically that of ELITE controllers. I have proposed that she consider our ACTG trial that looks at effects of COmplera on inflammatory markers in [employer]. I spent greater than 45 minutes with the patient including greater than 50% of time in face to face counsel of the patient and in coordination of their care.    Review of Systems  Constitutional: Negative for fever, chills, diaphoresis, activity change, appetite change, fatigue and unexpected weight change.  HENT: Negative for congestion, sore throat, rhinorrhea, sneezing, trouble swallowing and sinus pressure.   Eyes: Negative for photophobia and visual disturbance.  Respiratory: Negative for cough, chest tightness, shortness of breath, wheezing and stridor.   Cardiovascular: Negative for chest pain, palpitations and leg swelling.  Gastrointestinal: Negative for nausea, vomiting, abdominal pain, diarrhea, constipation, blood in stool, abdominal distention and anal bleeding.  Genitourinary: Negative for dysuria, hematuria, flank pain and difficulty urinating.  Musculoskeletal: Negative for myalgias, back pain, joint swelling, arthralgias and gait problem.  Skin: Negative for color change, pallor, rash and wound.  Neurological: Negative for dizziness, tremors, weakness and light-headedness.  Hematological: Negative for  adenopathy. Does not bruise/bleed easily.  Psychiatric/Behavioral: Positive for dysphoric mood. Negative for behavioral problems, confusion, sleep disturbance, decreased concentration and agitation.       Objective:   Physical Exam  Constitutional: She is oriented to person, place, and time. She appears well-developed and well-nourished. No distress.  HENT:  Head: Normocephalic and atraumatic.  Mouth/Throat: Oropharynx is clear and moist. No oropharyngeal exudate.  Eyes: Conjunctivae normal and EOM are normal. Pupils are equal, round, and reactive to light. No scleral icterus.  Neck: Normal range of motion. Neck supple. No JVD present.  Cardiovascular: Normal rate, regular rhythm and normal heart sounds.  Exam reveals no gallop and no friction rub.   No murmur heard. Pulmonary/Chest: Effort normal and breath sounds normal. No respiratory distress. She has no wheezes. She has no rales. She exhibits no tenderness.  Abdominal: She exhibits no distension and no mass. There is no tenderness. There is no rebound and no guarding.  Musculoskeletal: She exhibits no edema and no tenderness.  Lymphadenopathy:    She has no cervical adenopathy.  Neurological: She is alert and oriented to person, place, and time. She has normal reflexes. She exhibits normal muscle tone. Coordination normal.  Skin: Skin is warm and dry. She is not diaphoretic. No erythema. No pallor.  Psychiatric: Her behavior is normal. Judgment and thought content normal. She exhibits a depressed mood.          Assessment & Plan:  HIV: I have proposed entry into ACTG trial. Deirdre Evener to meet with the pt today  Depression: offered to have her see our counselor in clinic but she does not feel need at present.

## 2012-02-12 ENCOUNTER — Ambulatory Visit (INDEPENDENT_AMBULATORY_CARE_PROVIDER_SITE_OTHER): Payer: Commercial Indemnity | Admitting: *Deleted

## 2012-02-12 ENCOUNTER — Encounter: Payer: Self-pay | Admitting: *Deleted

## 2012-02-12 VITALS — BP 171/97 | HR 77 | Temp 98.9°F | Resp 16 | Ht 61.5 in | Wt 148.2 lb

## 2012-02-12 DIAGNOSIS — B20 Human immunodeficiency virus [HIV] disease: Secondary | ICD-10-CM

## 2012-02-12 LAB — COMPREHENSIVE METABOLIC PANEL
ALT: 10 U/L (ref 0–35)
Alkaline Phosphatase: 69 U/L (ref 39–117)
CO2: 22 mEq/L (ref 19–32)
Creat: 0.86 mg/dL (ref 0.50–1.10)
Glucose, Bld: 109 mg/dL — ABNORMAL HIGH (ref 70–99)
Sodium: 138 mEq/L (ref 135–145)
Total Bilirubin: 0.3 mg/dL (ref 0.3–1.2)
Total Protein: 7.7 g/dL (ref 6.0–8.3)

## 2012-02-12 LAB — CBC WITH DIFFERENTIAL/PLATELET
Basophils Relative: 1 % (ref 0–1)
Eosinophils Absolute: 0.2 10*3/uL (ref 0.0–0.7)
Eosinophils Relative: 2 % (ref 0–5)
MCH: 24.6 pg — ABNORMAL LOW (ref 26.0–34.0)
MCHC: 32.8 g/dL (ref 30.0–36.0)
MCV: 75 fL — ABNORMAL LOW (ref 78.0–100.0)
Neutrophils Relative %: 55 % (ref 43–77)
Platelets: 454 10*3/uL — ABNORMAL HIGH (ref 150–400)

## 2012-02-12 LAB — HEPATITIS C ANTIBODY: HCV Ab: NEGATIVE

## 2012-02-12 LAB — HEPATITIS B SURFACE ANTIGEN: Hepatitis B Surface Ag: NEGATIVE

## 2012-02-12 LAB — HIV ANTIBODY (ROUTINE TESTING W REFLEX): HIV: REACTIVE

## 2012-02-12 NOTE — Progress Notes (Signed)
  Subjective:    Patient ID: Connie Wade, female    DOB: 12-07-1955, 57 y.o.   MRN: [id number]  HPI    Review of Systems  Constitutional: Negative.   HENT: Positive for postnasal drip.   Eyes: Negative.   Respiratory: Negative.   Cardiovascular: Negative.   Gastrointestinal: Positive for abdominal pain.  Genitourinary: Positive for frequency.  Musculoskeletal: Positive for myalgias. Negative for arthralgias.  Skin: Negative.   Neurological: Negative.   Psychiatric/Behavioral: Negative.        Objective:   Physical Exam  Constitutional: She is oriented to person, place, and time. She appears well-developed.  HENT:  Head: Normocephalic.  Right Ear: Hearing normal.  Left Ear: Hearing normal.  Mouth/Throat: Oropharynx is clear and moist and mucous membranes are normal.  Eyes: No scleral icterus.  Neck: Normal range of motion.  Cardiovascular: Normal rate, regular rhythm, normal heart sounds and intact distal pulses.   Pulmonary/Chest: Effort normal and breath sounds normal.  Abdominal: Soft. Bowel sounds are normal. There is no hepatomegaly. There is tenderness in the right lower quadrant and left lower quadrant.  Musculoskeletal:       Lumbar back: She exhibits decreased range of motion and pain.  Lymphadenopathy:       Head (right side): No submental, no tonsillar, no preauricular, no posterior auricular and no occipital adenopathy present.       Head (left side): No submental, no tonsillar, no preauricular, no posterior auricular and no occipital adenopathy present.    She has no cervical adenopathy.       Right: No supraclavicular adenopathy present.       Left: No supraclavicular adenopathy present.  Neurological: She is alert and oriented to person, place, and time.  Skin: Skin is warm, dry and intact.  Psychiatric: She has a normal mood and affect.          Assessment & Plan:  Patient here for screening for study A5308, the Elite Controllers Study.  Informed consent was obtained per SOP guidelines. She had taken the consent home prior to the visit and read over it before hand and we discussed any questions she had about it prior to signing the consent. She believes she first tested positive around 1999. There was not a HIV Eliza or WB in her records so one will be obtained now. She has never taken any ARVS before. She is postmenopausal and has not had a menstrual cycle in over 15 years. Current complaints include lower back ache and left flank soreness, urinary frequency, but no burning, nasal drainage and lower abd tenderness which she attributes to irritable bowel syndrome. Once her labs have retruned we will schedule an appt for entry if she is eligible for study.

## 2012-02-14 LAB — HIV-1 RNA ULTRAQUANT REFLEX TO GENTYP+: HIV 1 RNA Quant: <20 copies/mL (ref <20)

## 2012-02-16 LAB — HIV 1/2 CONFIRMATION
HIV-1 antibody: POSITIVE — AB
HIV-2 Ab: NEGATIVE

## 2012-02-26 ENCOUNTER — Ambulatory Visit (INDEPENDENT_AMBULATORY_CARE_PROVIDER_SITE_OTHER): Payer: Commercial Indemnity | Admitting: *Deleted

## 2012-02-26 VITALS — BP 139/91 | HR 78 | Temp 98.6°F | Resp 17 | Wt 145.5 lb

## 2012-02-26 DIAGNOSIS — B20 Human immunodeficiency virus [HIV] disease: Secondary | ICD-10-CM

## 2012-02-26 NOTE — Progress Notes (Signed)
Pt here for Entry into A5308 study. Assessment is unchanged since her screening visit. Pt denies any new problems, signs, or symptoms. Questionnaires were completed. Fasting labs were drawn having to stick her twice. She received $50.00 for study visit. Next appt scheduled for Tuesday, March 22, 2012 @ 8:30am. Tacey Heap RN

## 2012-02-29 ENCOUNTER — Ambulatory Visit
Admission: RE | Admit: 2012-02-29 | Discharge: 2012-02-29 | Disposition: A | Discharge status: Home / Self Care | Payer: Commercial Indemnity | Source: Ambulatory Visit | Attending: Family Medicine | Admitting: Family Medicine

## 2012-02-29 DIAGNOSIS — M545 Low back pain, unspecified: Secondary | ICD-10-CM

## 2012-03-08 ENCOUNTER — Encounter: Payer: Self-pay | Admitting: Family Medicine

## 2012-03-08 ENCOUNTER — Ambulatory Visit (INDEPENDENT_AMBULATORY_CARE_PROVIDER_SITE_OTHER): Payer: Commercial Indemnity | Admitting: Family Medicine

## 2012-03-08 VITALS — BP 155/90 | HR 88 | Temp 98.4°F | Ht 61.5 in | Wt 144.0 lb

## 2012-03-08 DIAGNOSIS — F17200 Nicotine dependence, unspecified, uncomplicated: Secondary | ICD-10-CM

## 2012-03-08 DIAGNOSIS — I10 Essential (primary) hypertension: Secondary | ICD-10-CM

## 2012-03-08 DIAGNOSIS — M545 Low back pain, unspecified: Secondary | ICD-10-CM

## 2012-03-08 DIAGNOSIS — Z7189 Other specified counseling: Secondary | ICD-10-CM

## 2012-03-08 DIAGNOSIS — Z716 Tobacco abuse counseling: Secondary | ICD-10-CM

## 2012-03-08 MED ORDER — BUPROPION HCL ER (SR) 100 MG PO TB12: 100.0000 mg | ORAL_TABLET | Freq: Two times a day (BID) | ORAL | Status: DC

## 2012-03-08 MED ORDER — LISINOPRIL 10 MG PO TABS: 10.0000 mg | ORAL_TABLET | Freq: Every day | ORAL | Status: DC

## 2012-03-08 NOTE — Patient Instructions (Signed)
It was good to see you.  You have degenerative discs in your back.  Please continue to do your stretches at home, and take tylenol and ibuprofen for pain.  When the pain is severe it is OK to take a tramadol.   Your blood pressure today was BP: 155/90 mmHg.  Remember your goal blood pressure is under 140/90.  Please be sure to take your medication every day.    Quitting smoking is very important for your health- including your back health. Please try the wellbutrin (100 mg is the low dose).  Please let me know if we can assist you more in quitting.

## 2012-03-08 NOTE — Progress Notes (Signed)
  Subjective:    Patient ID: Connie Wade, female    DOB: Aug 20, 1955, 57 y.o.   MRN: [id number]  HPI  Connie Wade comes in for follow up of her back pain.  She had x-rays done, and we viewed these together.  She says that about a month ago she was having some severe back pain that was keeping her up at night.  At that time she was taking 600 mg of ibuprofen every four hours.  She says she forgot about the tramadol, and the flexeril does not help her.  She is doing some of the back home exercise program.  She says now her back pain has improved.  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 She denies any radicular pain, incontinence, weakness or numbness in LE.  She says she does not have time to go to PT.   HTN- says she has been told before she has HTN, but refuses to take "those pills."  She asks how important that really is.  Denies chest pain, dyspnea, LE edema, palpitations.    Tobacco abuse- wants to quit, planning to quit for [facility].  Quit before for 1.5 years, used wellbutrin then which helped.  She is willing to try this again.   Past Medical History  Diagnosis Date  . HIV 03/25/2006    since 2000  . HEPATITIS B 03/25/2006  . HYPERLIPIDEMIA 03/25/2006  . UTERINE FIBROID 03/25/2006  . ANEMIA, OTHER, UNSPECIFIED 03/25/2006  . TOBACCO DEPENDENCE 03/25/2006  . DEPRESSION 03/17/2007  . HYPERTENSION, BENIGN ESSENTIAL 04/26/2007  . HYDRADENITIS 03/25/2006  . Tubular adenoma of colon 06/23/2011    Benign, no high grade dysplasia 06/16/11 Followed by [facility] Dr. Ewing Schlein Repeat in 5 years (due 2018)   Family History  Problem Relation Age of Onset  . Heart disease Sister    History  Substance Use Topics  . Smoking status: Current Every Day Smoker -- 1.00 packs/day    Types: Cigarettes  . Smokeless tobacco: Not on file  . Alcohol Use: 0.6 oz/week    1 Glasses of wine per week     Comment: everday    Review of Systems Pertinent items in HPI    Objective:   Physical Exam BP 155/90  Pulse 88  Temp(Src) 98.4 F (36.9 C) (Oral)  Ht 5' 1.5" (1.562 m)  Wt 144 lb (65.318 kg)  BMI 26.77 kg/m2 General appearance: alert, cooperative and no distress Lungs: clear to auscultation bilaterally Heart: regular rate and rhythm, S1, S2 normal, no murmur, click, rub or gallop  Back: Normal Curvature, no deformities or CVA tenderness Lumbar spinal Tenderness: mild LE Strength 5/5 LE Sensation: in tact LE Reflexes 2+ and symmetric   Back X-Rays complete 02/29/12:  Findings: Normal alignment of the lumbar vertebral bodies. There  is joint space narrowing at L4-L5 and L5-L1 with endplate spurring.  Oblique projection demonstrates no pars fracture. No subluxation.     Assessment & Plan:

## 2012-03-08 NOTE — Assessment & Plan Note (Signed)
Discussed importance of BP control for stroke and heart attack prevention.  Multiple elevated readings, will start lisinopril.

## 2012-03-08 NOTE — Assessment & Plan Note (Signed)
Discussed that smokers have worse back pain than non-smokers.  Advised to quit smoking for multiple health reasons.  Patient agreeable to taking wellbutrin again to help her quit.  Rx written.

## 2012-03-08 NOTE — Assessment & Plan Note (Signed)
Discussed x-ray findings with patient extensively.  Told her she had degenerative disc disease (told to think of it as arthritis in her spine).  Discussed that when pain is intractable referral to Neuro surgery is an option. She does not want back surgery.  I reviewed safe amount of ibuprofen (3200 mg/day) and that long term use can cause stomach and kidney problems that can be serious.  I recommended taking Tramadol when back pain is severe.  She declines PT again, agrees to be more regular about home exercise program.

## 2012-03-18 ENCOUNTER — Encounter: Payer: Self-pay | Admitting: Infectious Disease

## 2012-03-18 LAB — CD4/CD8 (T-HELPER/T-SUPPRESSOR CELL)
CD4%: 47
CD4: 1410
CD8: 1068

## 2012-03-22 ENCOUNTER — Ambulatory Visit (INDEPENDENT_AMBULATORY_CARE_PROVIDER_SITE_OTHER): Payer: Commercial Indemnity | Admitting: *Deleted

## 2012-03-22 VITALS — BP 135/87 | HR 77 | Temp 98.1°F | Resp 18 | Wt 144.5 lb

## 2012-03-22 DIAGNOSIS — B20 Human immunodeficiency virus [HIV] disease: Secondary | ICD-10-CM

## 2012-03-22 LAB — CBC WITH DIFFERENTIAL/PLATELET
Basophils Absolute: 0.1 10*3/uL (ref 0.0–0.1)
Basophils Relative: 1 % (ref 0–1)
Eosinophils Absolute: 0.1 10*3/uL (ref 0.0–0.7)
Eosinophils Relative: 2 % (ref 0–5)
HCT: 35.1 % — ABNORMAL LOW (ref 36.0–46.0)
MCH: 24 pg — ABNORMAL LOW (ref 26.0–34.0)
MCHC: 32.2 g/dL (ref 30.0–36.0)
MCV: 74.7 fL — ABNORMAL LOW (ref 78.0–100.0)
Monocytes Absolute: 0.4 10*3/uL (ref 0.1–1.0)
Platelets: 558 10*3/uL — ABNORMAL HIGH (ref 150–400)
RDW: 19.1 % — ABNORMAL HIGH (ref 11.5–15.5)
WBC: 6.4 10*3/uL (ref 4.0–10.5)

## 2012-03-22 LAB — COMPREHENSIVE METABOLIC PANEL
ALT: 8 U/L (ref 0–35)
AST: 11 U/L (ref 0–37)
Alkaline Phosphatase: 70 U/L (ref 39–117)
BUN: 12 mg/dL (ref 6–23)
Calcium: 9.3 mg/dL (ref 8.4–10.5)
Chloride: 105 mEq/L (ref 96–112)
Creat: 0.66 mg/dL (ref 0.50–1.10)
Total Bilirubin: 0.2 mg/dL — ABNORMAL LOW (ref 0.3–1.2)

## 2012-03-22 NOTE — Progress Notes (Signed)
Here for A5308 study, week 4. Recently started taking Lisinopril for her HTN. States she feels like she sleeps more and her hot flashes have returned. Has not started taking her Wellbutrin to quit smoking. She states she is going to but is reluctant because the way it made her feel last time she took it. Her PCP prescribed her a lower dose to see if this helps. Will follow up to see how she tolerates this. Vital signs are stable. She has not taken her lisinopril this morning because it is not time. She normally takes it at 9am each morning. Non-fasting labs were drawn with no problems. Pt received $50 for study visit. Next appointment scheduled for Tuesday, May 17, 2012 @ 8:30 am. Tacey Heap RN

## 2012-04-06 ENCOUNTER — Encounter: Payer: Self-pay | Admitting: Infectious Disease

## 2012-04-06 ENCOUNTER — Telehealth: Payer: Self-pay | Admitting: *Deleted

## 2012-04-06 NOTE — Telephone Encounter (Signed)
Call made to f/u pt's wish to stop smoking. Pt reported she stopped taking med b/c it made her "mean." Still interested in quitting but expressed now is not the right time. Will call clinic when she is ready.

## 2012-04-27 ENCOUNTER — Ambulatory Visit (INDEPENDENT_AMBULATORY_CARE_PROVIDER_SITE_OTHER): Payer: Commercial Indemnity | Admitting: Family Medicine

## 2012-04-27 ENCOUNTER — Encounter: Payer: Self-pay | Admitting: Family Medicine

## 2012-04-27 VITALS — BP 130/85 | HR 73 | Temp 98.4°F | Ht 61.5 in | Wt 142.7 lb

## 2012-04-27 DIAGNOSIS — J309 Allergic rhinitis, unspecified: Secondary | ICD-10-CM

## 2012-04-27 DIAGNOSIS — R300 Dysuria: Secondary | ICD-10-CM

## 2012-04-27 DIAGNOSIS — R195 Other fecal abnormalities: Secondary | ICD-10-CM | POA: Insufficient documentation

## 2012-04-27 DIAGNOSIS — I10 Essential (primary) hypertension: Secondary | ICD-10-CM

## 2012-04-27 LAB — POCT URINALYSIS DIPSTICK
Bilirubin, UA: NEGATIVE
Glucose, UA: NEGATIVE
Spec Grav, UA: 1.025
Urobilinogen, UA: 0.2

## 2012-04-27 LAB — POCT UA - MICROSCOPIC ONLY

## 2012-04-27 MED ORDER — ALBUTEROL SULFATE HFA 108 (90 BASE) MCG/ACT IN AERS: 2.0000 | INHALATION_SPRAY | Freq: Four times a day (QID) | RESPIRATORY_TRACT | Status: DC | PRN

## 2012-04-27 NOTE — Assessment & Plan Note (Signed)
Discussed strategies to remember medication for compliance.  She will look at phone apps to help her remember to take it daily.  Continue Lisinopril at current dose.

## 2012-04-27 NOTE — Assessment & Plan Note (Signed)
Advised to re-start antihistamine OTC.  Rx for albuterol - she does not carry diagnosis of asthma/copd, but given history of wheezing I would feel more comfortable if she has the inhaler.

## 2012-04-27 NOTE — Assessment & Plan Note (Signed)
I suspect this is irritable bowel syndrome and is related to stress. Discussed this with patient, and management with diet modification, gave hand out.

## 2012-04-27 NOTE — Patient Instructions (Signed)
It was good to see you.  Please try taking an over the counter allergy pill like zyrtec or Claritin (you can try the generic).  Also, use the albuterol as needed.   Your blood pressure today was BP: 130/85 mmHg.  Remember your goal blood pressure is about 130/80.  Please be sure to take your medication every day.    For your loose stools- I do not think this is anything to worry about, although it is annoying.  Please see the hand out about Irritable bowel syndrome.

## 2012-04-27 NOTE — Progress Notes (Signed)
  Subjective:    Patient ID: Connie Wade, female    DOB: March 04, 1955, 57 y.o.   MRN: [id number]  HPI:  Connie Wade comes in for follow up.   Loose stools: Has had issues with this in the past when stressed.  She has loose stools in the morning without blood or mucus.  She says her husband is very sick with cancer, and she is his primary care giver.  She denies fevers, chills, nausea, vomiting.   Allergies: Has had increased nasal congestion and occasional wheezing with the warm weather.  She has required an inhaler in the past during the spring time. She is not taking any medications right now.    HTN: says she has had some problems remembering to take the lisinopril.  She denies any chest pain, dyspnea, LE edema, palpitations.    Past Medical History  Diagnosis Date  . HIV 03/25/2006    since 2000  . HEPATITIS B 03/25/2006  . HYPERLIPIDEMIA 03/25/2006  . UTERINE FIBROID 03/25/2006  . ANEMIA, OTHER, UNSPECIFIED 03/25/2006  . TOBACCO DEPENDENCE 03/25/2006  . DEPRESSION 03/17/2007  . HYPERTENSION, BENIGN ESSENTIAL 04/26/2007  . HYDRADENITIS 03/25/2006  . Tubular adenoma of colon 06/23/2011    Benign, no high grade dysplasia 06/16/11 Followed by [facility] Dr. Ewing Schlein Repeat in 5 years (due 2018)    History  Substance Use Topics  . Smoking status: Current Every Day Smoker -- 1.00 packs/day    Types: Cigarettes  . Smokeless tobacco: Not on file  . Alcohol Use: 0.6 oz/week    1 Glasses of wine per week     Comment: everday    Family History  Problem Relation Age of Onset  . Heart disease Sister      ROS Pertinent items in HPI    Objective:  Physical Exam:  BP 130/85  Pulse 73  Temp(Src) 98.4 F (36.9 C) (Oral)  Ht 5' 1.5" (1.562 m)  Wt 142 lb 11.2 oz (64.728 kg)  BMI 26.53 kg/m2  SpO2 99% General appearance: alert, cooperative and no distress Head: Normocephalic, without obvious abnormality, atraumatic Nose: + nasal congestion.  Lungs: clear to auscultation  bilaterally Heart: regular rate and rhythm, S1, S2 normal, no murmur, click, rub or gallop Pulses: 2+ and symmetric       Assessment & Plan:

## 2012-05-17 ENCOUNTER — Ambulatory Visit (INDEPENDENT_AMBULATORY_CARE_PROVIDER_SITE_OTHER): Payer: Commercial Indemnity | Admitting: *Deleted

## 2012-05-17 VITALS — BP 129/84 | HR 75 | Temp 97.9°F | Resp 18 | Wt 137.8 lb

## 2012-05-17 DIAGNOSIS — Z21 Asymptomatic human immunodeficiency virus [HIV] infection status: Secondary | ICD-10-CM

## 2012-05-17 DIAGNOSIS — B20 Human immunodeficiency virus [HIV] disease: Secondary | ICD-10-CM

## 2012-05-17 LAB — BASIC METABOLIC PANEL WITH GFR
BUN: 12 mg/dL (ref 6–23)
CO2: 25 meq/L (ref 19–32)
Calcium: 9.6 mg/dL (ref 8.4–10.5)
Chloride: 106 meq/L (ref 96–112)
Creat: 0.75 mg/dL (ref 0.50–1.10)
Glucose, Bld: 81 mg/dL (ref 70–99)
Potassium: 4.4 meq/L (ref 3.5–5.3)
Sodium: 139 meq/L (ref 135–145)

## 2012-05-17 LAB — PHOSPHORUS: Phosphorus: 4.2 mg/dL (ref 2.3–4.6)

## 2012-05-17 NOTE — Progress Notes (Signed)
Pt here for A5308, week 12. Pt denies any new problems, signs, or symptoms. Fasting labs were drawn. Vital signs stable. Dispensed Complera and discussed side effects and to contact us if develops a rash; discussed how she should take medication with the biggest meal of the day and that it should be at least 400 calories; discussed contraindicated medications. Provided her with a Complera pamphlet with the above information provided. Gave her a 2 week diary she is to chart in each day when she takes her medicine and bring it with her to each visit. Pt verbalized understanding and plans to start STR with dinner this evening. She received $50 gift card for visit. Will see her back on Thursday, May 19, 2012 @ 9am. Tacey Heap RN

## 2012-05-19 ENCOUNTER — Ambulatory Visit (INDEPENDENT_AMBULATORY_CARE_PROVIDER_SITE_OTHER): Payer: Commercial Indemnity | Admitting: *Deleted

## 2012-05-19 DIAGNOSIS — B20 Human immunodeficiency virus [HIV] disease: Secondary | ICD-10-CM

## 2012-05-19 NOTE — Progress Notes (Signed)
Pt here for Viral Decay visit for A5308. Pt started Complera 05/17/2012 and denies any new problems or symptoms. She states she is taking her STR with her last and largest meal of the day. She brought in her medicarion diary to her appt and I confirmed she was filling it out correctly. Lab draw performed at 10:55 with no problems. Pt received $50 gift card for visit. Next appt scheduled for Tuesday, May 24, 2012 @ 9am. Tacey Heap RN

## 2012-05-24 ENCOUNTER — Ambulatory Visit (INDEPENDENT_AMBULATORY_CARE_PROVIDER_SITE_OTHER): Payer: Commercial Indemnity | Admitting: *Deleted

## 2012-05-24 DIAGNOSIS — Z21 Asymptomatic human immunodeficiency virus [HIV] infection status: Secondary | ICD-10-CM

## 2012-05-24 DIAGNOSIS — B20 Human immunodeficiency virus [HIV] disease: Secondary | ICD-10-CM

## 2012-05-24 NOTE — Progress Notes (Signed)
Pt here for A5308 study, week 13 of the viral decay component. Pt has missed 2 doses of complera due to nausea, vomiting, diarrhea, stomach cramps that began on 05/22/2012. She is still c/o diarrhea. Advised her to start taking them when her symptoms have stopped. If her symptoms continue I told her to call us. I will however be calling her in a few days to check on her status and find out if she resumed taking her STR. She has been taken off the viral decay component due to her missing doses. She is still on study and her next appointment is Jun 14, 2012 @ 9am. Tacey Heap RN

## 2012-05-26 ENCOUNTER — Encounter: Payer: Self-pay | Admitting: Infectious Disease

## 2012-05-26 LAB — CD4/CD8 (T-HELPER/T-SUPPRESSOR CELL): CD4%: 47.9

## 2012-06-13 ENCOUNTER — Ambulatory Visit (INDEPENDENT_AMBULATORY_CARE_PROVIDER_SITE_OTHER): Payer: Commercial Indemnity | Admitting: *Deleted

## 2012-06-13 VITALS — BP 151/88 | HR 88 | Temp 98.2°F | Resp 14 | Wt 138.2 lb

## 2012-06-13 DIAGNOSIS — B20 Human immunodeficiency virus [HIV] disease: Secondary | ICD-10-CM

## 2012-06-13 DIAGNOSIS — Z21 Asymptomatic human immunodeficiency virus [HIV] infection status: Secondary | ICD-10-CM

## 2012-06-13 LAB — COMPREHENSIVE METABOLIC PANEL
Albumin: 4.6 g/dL (ref 3.5–5.2)
BUN: 7 mg/dL (ref 6–23)
Calcium: 10 mg/dL (ref 8.4–10.5)
Chloride: 105 mEq/L (ref 96–112)
Glucose, Bld: 77 mg/dL (ref 70–99)
Potassium: 4.1 mEq/L (ref 3.5–5.3)

## 2012-06-13 LAB — HIV-1 RNA QUANT-NO REFLEX-BLD: HIV-1 RNA Viral Load: <40

## 2012-06-13 NOTE — Progress Notes (Signed)
Pt here for week16 of A5308 study. Pt has no complaints and her symptoms from April have resolved. She restarted her Complera on 05/24/12 and have had no issues. Fasting labs were drawn. BP elevated at 155/88 however she had not taken her lisinopril this morning. She received $50.00 gift card for study visit. Next appointment scheduled for Tuesday, June 17th at 9am. Tacey Heap RN

## 2012-06-16 ENCOUNTER — Encounter: Payer: Self-pay | Admitting: Infectious Disease

## 2012-07-14 ENCOUNTER — Encounter: Payer: Self-pay | Admitting: Infectious Disease

## 2012-07-27 ENCOUNTER — Encounter: Payer: Self-pay | Admitting: *Deleted

## 2012-08-09 ENCOUNTER — Ambulatory Visit (INDEPENDENT_AMBULATORY_CARE_PROVIDER_SITE_OTHER): Payer: Commercial Indemnity | Admitting: *Deleted

## 2012-08-09 VITALS — BP 164/91 | HR 82 | Temp 98.1°F | Resp 16 | Wt 138.2 lb

## 2012-08-09 DIAGNOSIS — B20 Human immunodeficiency virus [HIV] disease: Secondary | ICD-10-CM

## 2012-08-09 DIAGNOSIS — Z21 Asymptomatic human immunodeficiency virus [HIV] infection status: Secondary | ICD-10-CM

## 2012-08-09 NOTE — Progress Notes (Signed)
Patient here for week 24 A5308 study visit. She denies any new problems or concerns. She occasionally forgets to take her medicine, but otherwise hasn't had any problems with the complera. She continues to have lower back pain which she takes advil for. She will return in 12 weeks for the next visit.

## 2012-08-30 ENCOUNTER — Encounter: Payer: Self-pay | Admitting: Infectious Disease

## 2012-08-30 LAB — HIV-1 RNA QUANT-NO REFLEX-BLD: HIV-1 RNA Viral Load: <40

## 2012-09-13 ENCOUNTER — Encounter (HOSPITAL_COMMUNITY): Payer: Self-pay | Admitting: Emergency Medicine

## 2012-09-13 ENCOUNTER — Emergency Department (HOSPITAL_COMMUNITY): Payer: Managed Care, Other (non HMO)

## 2012-09-13 ENCOUNTER — Emergency Department (HOSPITAL_COMMUNITY)
Admission: EM | Admit: 2012-09-13 | Discharge: 2012-09-13 | Disposition: A | Discharge status: Home / Self Care | Payer: Managed Care, Other (non HMO) | Attending: Emergency Medicine | Admitting: Emergency Medicine

## 2012-09-13 DIAGNOSIS — Z8742 Personal history of other diseases of the female genital tract: Secondary | ICD-10-CM | POA: Insufficient documentation

## 2012-09-13 DIAGNOSIS — Z872 Personal history of diseases of the skin and subcutaneous tissue: Secondary | ICD-10-CM | POA: Insufficient documentation

## 2012-09-13 DIAGNOSIS — Z8619 Personal history of other infectious and parasitic diseases: Secondary | ICD-10-CM | POA: Insufficient documentation

## 2012-09-13 DIAGNOSIS — F17200 Nicotine dependence, unspecified, uncomplicated: Secondary | ICD-10-CM | POA: Insufficient documentation

## 2012-09-13 DIAGNOSIS — Z79899 Other long term (current) drug therapy: Secondary | ICD-10-CM | POA: Insufficient documentation

## 2012-09-13 DIAGNOSIS — Z21 Asymptomatic human immunodeficiency virus [HIV] infection status: Secondary | ICD-10-CM | POA: Insufficient documentation

## 2012-09-13 DIAGNOSIS — Z8639 Personal history of other endocrine, nutritional and metabolic disease: Secondary | ICD-10-CM | POA: Insufficient documentation

## 2012-09-13 DIAGNOSIS — J3489 Other specified disorders of nose and nasal sinuses: Secondary | ICD-10-CM | POA: Insufficient documentation

## 2012-09-13 DIAGNOSIS — Z8659 Personal history of other mental and behavioral disorders: Secondary | ICD-10-CM | POA: Insufficient documentation

## 2012-09-13 DIAGNOSIS — Z862 Personal history of diseases of the blood and blood-forming organs and certain disorders involving the immune mechanism: Secondary | ICD-10-CM | POA: Insufficient documentation

## 2012-09-13 DIAGNOSIS — R05 Cough: Secondary | ICD-10-CM | POA: Insufficient documentation

## 2012-09-13 DIAGNOSIS — J40 Bronchitis, not specified as acute or chronic: Secondary | ICD-10-CM

## 2012-09-13 DIAGNOSIS — I10 Essential (primary) hypertension: Secondary | ICD-10-CM | POA: Insufficient documentation

## 2012-09-13 DIAGNOSIS — R059 Cough, unspecified: Secondary | ICD-10-CM | POA: Insufficient documentation

## 2012-09-13 MED ORDER — PROMETHAZINE-DM 6.25-15 MG/5ML PO SYRP: 5.0000 mL | ORAL_SOLUTION | Freq: Four times a day (QID) | ORAL | Status: DC | PRN

## 2012-09-13 MED ORDER — GUAIFENESIN ER 1200 MG PO TB12: 1.0000 | ORAL_TABLET | Freq: Two times a day (BID) | ORAL | Status: DC

## 2012-09-13 MED ORDER — DOXYCYCLINE HYCLATE 100 MG PO CAPS: 100.0000 mg | ORAL_CAPSULE | Freq: Two times a day (BID) | ORAL | Status: DC

## 2012-09-13 MED ORDER — IPRATROPIUM BROMIDE 0.02 % IN SOLN
0.5000 mg | Freq: Once | RESPIRATORY_TRACT | Status: AC
  Administered 2012-09-13: 0.5 mg via RESPIRATORY_TRACT
  Filled 2012-09-13: qty 2.5

## 2012-09-13 MED ORDER — PREDNISONE 20 MG PO TABS
60.0000 mg | ORAL_TABLET | Freq: Once | ORAL | Status: AC
  Administered 2012-09-13: 60 mg via ORAL
  Filled 2012-09-13: qty 3

## 2012-09-13 MED ORDER — PREDNISONE 50 MG PO TABS: 50.0000 mg | ORAL_TABLET | Freq: Every day | ORAL | Status: DC

## 2012-09-13 MED ORDER — ALBUTEROL SULFATE (5 MG/ML) 0.5% IN NEBU
5.0000 mg | INHALATION_SOLUTION | Freq: Once | RESPIRATORY_TRACT | Status: AC
  Administered 2012-09-13: 5 mg via RESPIRATORY_TRACT
  Filled 2012-09-13 (×2): qty 0.5

## 2012-09-13 NOTE — ED Notes (Signed)
Pt given ham sandwich and cheese.  

## 2012-09-13 NOTE — ED Notes (Signed)
Pt states she is feeling better. She has a cough and states her head is beginning to hurt. She thinks because she is hungry.

## 2012-09-13 NOTE — ED Notes (Signed)
Pt c/o of SOB and cough since Sunday. Pt states that she taken allergy medication with no relief. Pt also tried using inhaler with no relief. Non productive cough. Hx HIV

## 2012-09-13 NOTE — ED Notes (Signed)
Pt states she has  not taken her blood pressure pill today because she has not eaten. BP 158/82

## 2012-09-13 NOTE — ED Provider Notes (Signed)
CSN: [id number]     Arrival date & time 09/13/12  1632 History     First MD Initiated Contact with Patient 09/13/12 1852     Chief Complaint  Patient presents with  . Shortness of Breath  . Cough   (Consider location/radiation/quality/duration/timing/severity/associated sxs/prior Treatment) HPI Patient presents to the emergency department with cough and nasal congestion for the last 2 days.  Patient, states, that she is a smoker.  She states she occasionally has to use an inhaler.  Patient denies chest pain, nausea, vomiting, diarrhea, abdominal pain, back pain, blurred vision, headache, weakness, numbness, dizziness, fever, or syncope.  Patient, states she took over-the-counter cough, and cold medications with mild relief.  Patient, states nothing seems to make her condition, better or worse Past Medical History  Diagnosis Date  . HIV 03/25/2006    since 2000  . HEPATITIS B 03/25/2006  . HYPERLIPIDEMIA 03/25/2006  . UTERINE FIBROID 03/25/2006  . ANEMIA, OTHER, UNSPECIFIED 03/25/2006  . TOBACCO DEPENDENCE 03/25/2006  . DEPRESSION 03/17/2007  . HYPERTENSION, BENIGN ESSENTIAL 04/26/2007  . HYDRADENITIS 03/25/2006  . Tubular adenoma of colon 06/23/2011    Benign, no high grade dysplasia 06/16/11 Followed by [facility] Dr. Ewing Schlein Repeat in 5 years (due 2018)   Past Surgical History  Procedure Laterality Date  . Ovarian cyst removal  2001   Family History  Problem Relation Age of Onset  . Heart disease Sister    History  Substance Use Topics  . Smoking status: Current Every Day Smoker -- 1.00 packs/day    Types: Cigarettes  . Smokeless tobacco: Not on file  . Alcohol Use: 0.6 oz/week    1 Glasses of wine per week     Comment: everday   OB History   Grav Para Term Preterm Abortions TAB SAB Ect Mult Living                 Review of Systems All other systems negative except as documented in the HPI. All pertinent positives and negatives as reviewed in the HPI. Allergies  Review of  patient's allergies indicates no known allergies.  Home Medications   Current Outpatient Rx  Name  Route  Sig  Dispense  Refill  . albuterol (PROVENTIL HFA;VENTOLIN HFA) 108 (90 BASE) MCG/ACT inhaler   Inhalation   Inhale 2 puffs into the lungs every 6 (six) hours as needed for wheezing.   1 Inhaler   6   . DM-GG-Chlorphen-Acetaminophen (CORICIDIN HBP DAY/NIGHT COLD PO)   Oral   Take 2 tablets by mouth every 8 (eight) hours as needed (for cold symptoms).         Marland Kitchen ibuprofen (ADVIL,MOTRIN) 200 MG tablet   Oral   Take 600 mg by mouth every 8 (eight) hours as needed for pain.         Marland Kitchen lisinopril (PRINIVIL,ZESTRIL) 10 MG tablet   Oral   Take 1 tablet (10 mg total) by mouth daily.   90 tablet   3    BP 158/82  Pulse 85  Temp(Src) 99.4 F (37.4 C) (Oral)  Resp 19  SpO2 98% Physical Exam  Nursing note and vitals reviewed. Constitutional: She is oriented to person, place, and time. She appears well-developed and well-nourished. No distress.  HENT:  Head: Normocephalic and atraumatic.  Mouth/Throat: Oropharynx is clear and moist.  Eyes: Pupils are equal, round, and reactive to light.  Neck: Normal range of motion. Neck supple.  Cardiovascular: Normal rate, regular rhythm and normal heart sounds.  Exam reveals no gallop and no friction rub.   No murmur heard. Pulmonary/Chest: Effort normal. No accessory muscle usage. Not tachypneic and not bradypneic. No respiratory distress. She has no decreased breath sounds. She has no wheezes. She has rhonchi in the right upper field, the right middle field, the right lower field, the left upper field, the left middle field and the left lower field. She has no rales.  Musculoskeletal: She exhibits no edema.  Neurological: She is alert and oriented to person, place, and time. She exhibits normal muscle tone. Coordination normal.  Skin: Skin is warm and dry. No erythema.    ED Course   Procedures (including critical care time)  Labs  Reviewed - No data to display Dg Chest 2 View  09/13/2012   *RADIOLOGY REPORT*  Clinical Data: Cough.  Wheezing.  CHEST - 2 VIEW  Comparison: 07/25/2010  Findings: The heart size and pulmonary vascularity are normal and the lungs are clear except for slight peribronchial thickening best seen on the lateral view.  No acute osseous abnormality.  IMPRESSION: Bronchitic changes.   Original Report Authenticated By: Francene Boyers, M.D.   Patient has a negative chest x-ray..  This represents a bronchitis based on the history of present illness and physical exam the patient.  The patient will be advised to return here as needed.  Advised to follow up with her primary care Dr.   MDM    Carlyle Dolly, PA-C 09/13/12 2022

## 2012-09-14 NOTE — ED Provider Notes (Signed)
Medical screening examination/treatment/procedure(s) were performed by non-physician practitioner and as supervising physician I was immediately available for consultation/collaboration.  Suhey Radford N Marliss Buttacavoli, DO 09/14/12 0006 

## 2012-09-19 ENCOUNTER — Encounter: Payer: Self-pay | Admitting: Family Medicine

## 2012-09-19 ENCOUNTER — Ambulatory Visit (INDEPENDENT_AMBULATORY_CARE_PROVIDER_SITE_OTHER): Payer: Managed Care, Other (non HMO) | Admitting: Family Medicine

## 2012-09-19 VITALS — BP 153/88 | HR 80 | Temp 98.6°F | Ht 61.5 in | Wt 135.0 lb

## 2012-09-19 DIAGNOSIS — Z720 Tobacco use: Secondary | ICD-10-CM | POA: Insufficient documentation

## 2012-09-19 DIAGNOSIS — J209 Acute bronchitis, unspecified: Secondary | ICD-10-CM

## 2012-09-19 DIAGNOSIS — F17200 Nicotine dependence, unspecified, uncomplicated: Secondary | ICD-10-CM

## 2012-09-19 NOTE — Patient Instructions (Addendum)
Connie Wade,  Thank you for coming in today. Please finish your antibiotic. Take it with a bit of food if nausea persist. Remember to stay sitting up for at least 30 minutes after taking the antibiotic.   Great job on decreasing smoking. Please continue to work on quitting: Smoking cessation support: smoking cessation hotline: 1-800-QUIT-NOW.  Here is the number to the smoking cessation classes at [place]: 161-0960  F/u with your PCP Dr . Wenda Low for follow up.   Dr. Armen Pickup

## 2012-09-19 NOTE — Assessment & Plan Note (Signed)
A: improved due to recent illness. Patient ready to quit. Declines nicotine replacement therapy.  P: Smoking cessation resources provided.

## 2012-09-19 NOTE — Progress Notes (Signed)
Subjective:     Patient ID: Connie Wade, female   DOB: 01/27/1955, 57 y.o.   MRN: [id number]  HPI 57 yo F smoker with HIV followed at [facility]  presents for ED f/u visit:  She went to the ED on 09/13/12 with productive cough and SOB. She was diagnosed with bronchitis. She has completed a 5 day course of prednisone. She is currently taking a 10 day course of doxycycline. She has missed one dose. She still has cough with sputum production that has improved. Some SOB. No CP or HA. Had episode of nausea and emesis this AM after taking doxy with glass of water and drinking a cup of coffee. No fever. Still smokes, regular Marlboro, down from 1 PPD to 1/2 PPD. Ready to quit.   Review of Systems As per HPI     Objective:   Physical Exam BP 153/88  Pulse 80  Temp(Src) 98.6 F (37 C) (Oral)  Ht 5' 1.5" (1.562 m)  Wt 135 lb (61.236 kg)  BMI 25.1 kg/m2  SpO2 98% General appearance: alert, cooperative and no distress Throat: lips, mucosa, and tongue normal; teeth and gums normal Lungs: normal WOB. coarse BS bilaterally with scattered wheezing.  Heart: regular rate and rhythm, S1, S2 normal, no murmur, click, rub or gallop Abdomen: soft, non-tender; bowel sounds normal; no masses,  no organomegaly  CXR 09/13/12: reviewed. Bronchitic changes. No infiltrate.     Assessment and Plan:

## 2012-09-19 NOTE — Assessment & Plan Note (Signed)
A: improving s/p steroids and with doxycycline. I suspect that this mornings episode of nausea was due to taking doxycycline. P: Finish course of doxy, take with a bit of food if needed Saltines and ginger prn nausea Albuterol prn Smoking cessation

## 2012-11-01 ENCOUNTER — Ambulatory Visit (INDEPENDENT_AMBULATORY_CARE_PROVIDER_SITE_OTHER): Payer: Self-pay | Admitting: *Deleted

## 2012-11-01 ENCOUNTER — Other Ambulatory Visit (INDEPENDENT_AMBULATORY_CARE_PROVIDER_SITE_OTHER): Payer: Managed Care, Other (non HMO)

## 2012-11-01 VITALS — BP 132/85 | HR 76 | Temp 98.1°F | Resp 16 | Wt 138.5 lb

## 2012-11-01 DIAGNOSIS — Z23 Encounter for immunization: Secondary | ICD-10-CM

## 2012-11-01 DIAGNOSIS — B20 Human immunodeficiency virus [HIV] disease: Secondary | ICD-10-CM

## 2012-11-01 DIAGNOSIS — Z0000 Encounter for general adult medical examination without abnormal findings: Secondary | ICD-10-CM

## 2012-11-01 DIAGNOSIS — Z21 Asymptomatic human immunodeficiency virus [HIV] infection status: Secondary | ICD-10-CM

## 2012-11-01 LAB — COMPREHENSIVE METABOLIC PANEL
ALT: 14 U/L (ref 0–35)
AST: 15 U/L (ref 0–37)
Albumin: 4.2 g/dL (ref 3.5–5.2)
Alkaline Phosphatase: 78 U/L (ref 39–117)
BUN: 13 mg/dL (ref 6–23)
Calcium: 9.1 mg/dL (ref 8.4–10.5)
Chloride: 106 mEq/L (ref 96–112)
Creat: 0.78 mg/dL (ref 0.50–1.10)
Potassium: 4.4 mEq/L (ref 3.5–5.3)

## 2012-11-01 LAB — CD4/CD8 (T-HELPER/T-SUPPRESSOR CELL)
CD4: 1209
CD8 % Suppressor T Cell: 45.2
CD8: 1175

## 2012-11-01 LAB — HIV-1 RNA QUANT-NO REFLEX-BLD: HIV-1 RNA Viral Load: <40

## 2012-11-01 NOTE — Progress Notes (Signed)
Connie Wade is here for A5308 study, week 36. She did go to the ED in August for bronchitis and symptoms have resolved. Other than hx of bronchitis she denies any other signs, symptoms, or problems. Fasting labs were drawn. Vital signs are stable. She received $50 gift card for study visit as well as her study medication. Next appointment scheduled for Tuesday, January 31, 2013 @ 9:30am. Tacey Heap RN

## 2012-11-18 ENCOUNTER — Encounter: Payer: Self-pay | Admitting: Infectious Disease

## 2012-12-01 ENCOUNTER — Telehealth: Payer: Self-pay | Admitting: *Deleted

## 2012-12-01 NOTE — Telephone Encounter (Signed)
Requested pt call for January appt with Dr. Daiva Eves.

## 2013-01-31 ENCOUNTER — Ambulatory Visit (INDEPENDENT_AMBULATORY_CARE_PROVIDER_SITE_OTHER): Payer: Self-pay | Admitting: *Deleted

## 2013-01-31 VITALS — BP 165/94 | HR 85 | Temp 98.1°F | Resp 16 | Wt 133.0 lb

## 2013-01-31 DIAGNOSIS — B20 Human immunodeficiency virus [HIV] disease: Secondary | ICD-10-CM

## 2013-01-31 DIAGNOSIS — Z21 Asymptomatic human immunodeficiency virus [HIV] infection status: Secondary | ICD-10-CM

## 2013-01-31 NOTE — Progress Notes (Signed)
Patient here for week 69 A5308 Study visit. She has lost a few pounds, which she attributes to not having any appetite. Her husband died around the first of 01/25/23 and she is still trying to deal with that. He had been ill for awhile, but she said they had been together off and on for 30 years. She also has a healing lesion on her midback which she said was a boil that had burst and drained. Her adherence to her study meds has not been good since she has not been eating regularly and we did not talk about some adherence strategies. I made her an appointment to see Dr. Tommy Medal next week as it had been a year since she had been seen. She will return March 24 th for the next study visit. At that time , she needs to decide whether she will stay on study for another 48 weeks or come off. If she does stay on study she has the option to be on or off of the complera.

## 2013-02-09 ENCOUNTER — Encounter: Payer: Self-pay | Admitting: Infectious Disease

## 2013-02-09 ENCOUNTER — Ambulatory Visit (INDEPENDENT_AMBULATORY_CARE_PROVIDER_SITE_OTHER): Payer: Managed Care, Other (non HMO) | Admitting: Infectious Disease

## 2013-02-09 VITALS — BP 135/85 | HR 76 | Temp 98.3°F | Wt 132.5 lb

## 2013-02-09 DIAGNOSIS — F32A Depression, unspecified: Secondary | ICD-10-CM

## 2013-02-09 DIAGNOSIS — B20 Human immunodeficiency virus [HIV] disease: Secondary | ICD-10-CM

## 2013-02-09 DIAGNOSIS — F329 Major depressive disorder, single episode, unspecified: Secondary | ICD-10-CM

## 2013-02-09 DIAGNOSIS — F3289 Other specified depressive episodes: Secondary | ICD-10-CM

## 2013-02-09 MED ORDER — EMTRICITAB-RILPIVIR-TENOFOV DF 200-25-300 MG PO TABS: 1.0000 | ORAL_TABLET | Freq: Every day | ORAL | Status: DC

## 2013-02-09 NOTE — Progress Notes (Signed)
  Subjective:    Patient ID: Connie Wade, female    DOB: 1955-04-17, 58 y.o.   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 She did not take her complera properly for one week in December is grieving the loss of her husband who died of non-Hodgkin's lymphoma.       Review of Systems  Constitutional: Positive for appetite change. Negative for fever, chills, diaphoresis, activity change, fatigue and unexpected weight change.  HENT: Negative for congestion, rhinorrhea, sinus pressure, sneezing, sore throat and trouble swallowing.   Eyes: Negative for photophobia and visual disturbance.  Respiratory: Negative for cough, chest tightness, shortness of breath, wheezing and stridor.   Cardiovascular: Negative for chest pain, palpitations and leg swelling.  Gastrointestinal: Negative for nausea, vomiting, abdominal pain, diarrhea, constipation, blood in stool, abdominal distention and anal bleeding.  Genitourinary: Negative for dysuria, hematuria, flank pain and difficulty urinating.  Musculoskeletal: Negative for arthralgias, back pain, gait problem, joint swelling and myalgias.  Skin: Negative for color change, pallor, rash and wound.  Neurological: Negative for dizziness, tremors, weakness and light-headedness.  Hematological: Negative for adenopathy. Does not bruise/bleed easily.  Psychiatric/Behavioral: Positive for sleep disturbance and dysphoric mood. Negative for suicidal ideas, behavioral problems, confusion, decreased concentration and agitation.       Objective:   Physical Exam  Constitutional: She is oriented to person, place, and time. She appears well-developed and well-nourished. No distress.  HENT:  Head: Normocephalic and atraumatic.  Mouth/Throat: Oropharynx is clear and moist. No oropharyngeal exudate.  Eyes: Conjunctivae and EOM are normal. Pupils are equal, round, and  reactive to light. No scleral icterus.  Neck: Normal range of motion. Neck supple. No JVD present.  Cardiovascular: Normal rate, regular rhythm and normal heart sounds.  Exam reveals no gallop and no friction rub.   No murmur heard. Pulmonary/Chest: Effort normal and breath sounds normal. No respiratory distress. She has no wheezes. She has no rales. She exhibits no tenderness.  Abdominal: She exhibits no distension and no mass. There is no tenderness. There is no rebound and no guarding.  Musculoskeletal: She exhibits no edema and no tenderness.  Lymphadenopathy:    She has no cervical adenopathy.  Neurological: She is alert and oriented to person, place, and time. She has normal reflexes. She exhibits normal muscle tone. Coordination normal.  Skin: Skin is warm and dry. She is not diaphoretic. No erythema. No pallor.  Psychiatric: Her behavior is normal. Judgment and thought content normal. She exhibits a depressed mood.          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 When she finishes the initial phase will have to decide whether to remain on complera or not. One purpose of the study is to look at inflammatory markers that are typically higher in Elite controller's versus other HIV infected patients and to see what difference if any antiretrovirals medications make. I spent greater than 25 minutes with the patient including greater than 50% of time in face to face counsel of the patient and in coordination of their care.   Depression and grieving: offered to have her see our counselor in clinic but she does not feel need at present. Similarly she did not want antidepressant.  Needs triple is vaccination flu vaccine given.

## 2013-03-02 ENCOUNTER — Encounter: Payer: Self-pay | Admitting: Infectious Disease

## 2013-03-02 LAB — HIV-1 RNA QUANT-NO REFLEX-BLD: HIV-1 RNA Viral Load: <40

## 2013-03-02 LAB — CD4/CD8 (T-HELPER/T-SUPPRESSOR CELL)
CD4%: 49.3
CD4: 1578

## 2013-03-07 ENCOUNTER — Ambulatory Visit (INDEPENDENT_AMBULATORY_CARE_PROVIDER_SITE_OTHER): Payer: Managed Care, Other (non HMO) | Admitting: Family Medicine

## 2013-03-07 ENCOUNTER — Encounter: Payer: Self-pay | Admitting: Family Medicine

## 2013-03-07 VITALS — BP 140/92 | HR 76 | Ht 61.5 in | Wt 131.0 lb

## 2013-03-07 DIAGNOSIS — M545 Low back pain, unspecified: Secondary | ICD-10-CM | POA: Insufficient documentation

## 2013-03-07 DIAGNOSIS — S39012A Strain of muscle, fascia and tendon of lower back, initial encounter: Secondary | ICD-10-CM

## 2013-03-07 DIAGNOSIS — S335XXA Sprain of ligaments of lumbar spine, initial encounter: Secondary | ICD-10-CM

## 2013-03-07 MED ORDER — NAPROXEN 250 MG PO TABS: 250.0000 mg | ORAL_TABLET | Freq: Two times a day (BID) | ORAL | Status: DC

## 2013-03-07 MED ORDER — ACETAMINOPHEN 500 MG PO TABS: 500.0000 mg | ORAL_TABLET | Freq: Four times a day (QID) | ORAL | Status: DC | PRN

## 2013-03-07 MED ORDER — CYCLOBENZAPRINE HCL 10 MG PO TABS: 10.0000 mg | ORAL_TABLET | Freq: Three times a day (TID) | ORAL | Status: DC | PRN

## 2013-03-07 NOTE — Progress Notes (Signed)
Patient ID: HISAYO DELOSSANTOS    DOB: [birth year], 58 y.o.   MRN: [id number] --- Subjective:  Connie Wade is a 58 y.o.female with HIV on emtricitab/ripivir/tenofivir and lumbar degenerative disc disease who presents with acute low back pain.  She says that yesterday, she started having cramping in her lower back with difficulty bending. Pain is constant. She doesn't recall any trauma, no heavy lifting. She had been bending more in the last few days packing boxes at work. She took 3 ibuprofen which did not help. She used [employer]/film/video which helped.  She denies any lower extremity weakness. No urine or bowel incontinence.   ROS: see HPI Past Medical History: reviewed and updated medications and allergies. Social History: Tobacco: current every day smoker  Objective: Filed Vitals:   03/07/13 0842  BP: 140/92  Pulse: 76    Physical Examination:   General appearance - alert, well appearing, and in no distress Back - tenderness to palpation along left paralumbar muscles, no spinal tenderness, normal flexion, extension and lateral bend, 5/5 strength with knee extension bilaterally, flexion, 4+/5 foot dorsiflexion and plantarflexion bilaterally, trace patellar reflex equal and symmetric, negative straight leg bilaterally.

## 2013-03-07 NOTE — Assessment & Plan Note (Addendum)
Acute on chronic lumbar pain. Likely exacerbated by repetitive bending at work. -Patient with significant spasm and muscle tightness in the paralumbar region. Will therefore prescribe Flexeril 10 mg at night. -Patient doesn't like tramadol and feels like it makes her nauseous. She also doesn't want any narcotic medicines like Vicodin. Prescribed Tylenol extra strength for pain. Patient is on antiviral therapy for her HIV which along with NSAIDs can increase risk of kidney injury. I therefore recommended that she take Aleve sparingly in case the Tylenol doesn't work. Patient expressed understanding and agreed with plan - Return to care if not better in 2 weeks -Encouraged continued activity

## 2013-03-07 NOTE — Patient Instructions (Addendum)
Take the naproxen for no more than 2-3 days due to the risk of kidney damage with the antiviral therapy. Use the tylenol first and if that doesn't help, use the naproxen sparingly.  Return to care in 10 days if you do not get any better.   Low Back Sprain with Rehab  A sprain is an injury in which a ligament is torn. The ligaments of the lower back are vulnerable to sprains. However, they are strong and require great force to be injured. These ligaments are important for stabilizing the spinal column. Sprains are classified into three categories. Grade 1 sprains cause pain, but the tendon is not lengthened. Grade 2 sprains include a lengthened ligament, due to the ligament being stretched or partially ruptured. With grade 2 sprains there is still function, although the function may be decreased. Grade 3 sprains involve a complete tear of the tendon or muscle, and function is usually impaired. SYMPTOMS   Severe pain in the lower back.  Sometimes, a feeling of a "pop," "snap," or tear, at the time of injury.  Tenderness and sometimes swelling at the injury site.  Uncommonly, bruising (contusion) within 48 hours of injury.  Muscle spasms in the back. CAUSES  Low back sprains occur when a force is placed on the ligaments that is greater than they can handle. Common causes of injury include:  Performing a stressful act while off-balance.  Repetitive stressful activities that involve movement of the lower back.  Direct hit (trauma) to the lower back. RISK INCREASES WITH:  Contact sports (football, wrestling).  Collisions (major skiing accidents).  Sports that require throwing or lifting (baseball, weightlifting).  Sports involving twisting of the spine (gymnastics, diving, tennis, golf).  Poor strength and flexibility.  Inadequate protection.  Previous back injury or surgery (especially fusion). PREVENTION  Wear properly fitted and padded protective equipment.  Warm up and  stretch properly before activity.  Allow for adequate recovery between workouts.  Maintain physical fitness:  Strength, flexibility, and endurance.  Cardiovascular fitness.  Maintain a healthy body weight. PROGNOSIS  If treated properly, low back sprains usually heal with non-surgical treatment. The length of time for healing depends on the severity of the injury.  RELATED COMPLICATIONS   Recurring symptoms, resulting in a chronic problem.  Chronic inflammation and pain in the low back.  Delayed healing or resolution of symptoms, especially if activity is resumed too soon.  Prolonged impairment.  Unstable or arthritic joints of the low back. TREATMENT  Treatment first involves the use of ice and medicine, to reduce pain and inflammation. The use of strengthening and stretching exercises may help reduce pain with activity. These exercises may be performed at home or with a therapist. Severe injuries may require referral to a therapist for further evaluation and treatment, such as ultrasound. Your caregiver may advise that you wear a back brace or corset, to help reduce pain and discomfort. Often, prolonged bed rest results in greater harm then benefit. Corticosteroid injections may be recommended. However, these should be reserved for the most serious cases. It is important to avoid using your back when lifting objects. At night, sleep on your back on a firm mattress, with a pillow placed under your knees. If non-surgical treatment is unsuccessful, surgery may be needed.  MEDICATION   If pain medicine is needed, nonsteroidal anti-inflammatory medicines (aspirin and ibuprofen), or other minor pain relievers (acetaminophen), are often advised.  Do not take pain medicine for 7 days before surgery.  Prescription pain relievers  may be given, if your caregiver thinks they are needed. Use only as directed and only as much as you need.  Ointments applied to the skin may be  helpful.  Corticosteroid injections may be given by your caregiver. These injections should be reserved for the most serious cases, because they may only be given a certain number of times. HEAT AND COLD  Cold treatment (icing) should be applied for 10 to 15 minutes every 2 to 3 hours for inflammation and pain, and immediately after activity that aggravates your symptoms. Use ice packs or an ice massage.  Heat treatment may be used before performing stretching and strengthening activities prescribed by your caregiver, physical therapist, or athletic trainer. Use a heat pack or a warm water soak. SEEK MEDICAL CARE IF:   Symptoms get worse or do not improve in 2 to 4 weeks, despite treatment.  You develop numbness or weakness in either leg.  You lose bowel or bladder function.  Any of the following occur after surgery: fever, increased pain, swelling, redness, drainage of fluids, or bleeding in the affected area.  New, unexplained symptoms develop. (Drugs used in treatment may produce side effects.) EXERCISES  RANGE OF MOTION (ROM) AND STRETCHING EXERCISES - Low Back Sprain Most people with lower back pain will find that their symptoms get worse with excessive bending forward (flexion) or arching at the lower back (extension). The exercises that will help resolve your symptoms will focus on the opposite motion.  Your physician, physical therapist or athletic trainer will help you determine which exercises will be most helpful to resolve your lower back pain. Do not complete any exercises without first consulting with your caregiver. Discontinue any exercises which make your symptoms worse, until you speak to your caregiver. If you have pain, numbness or tingling which travels down into your buttocks, leg or foot, the goal of the therapy is for these symptoms to move closer to your back and eventually resolve. Sometimes, these leg symptoms will get better, but your lower back pain may worsen.  This is often an indication of progress in your rehabilitation. Be very alert to any changes in your symptoms and the activities in which you participated in the 24 hours prior to the change. Sharing this information with your caregiver will allow him or her to most efficiently treat your condition. These exercises may help you when beginning to rehabilitate your injury. Your symptoms may resolve with or without further involvement from your physician, physical therapist or athletic trainer. While completing these exercises, remember:   Restoring tissue flexibility helps normal motion to return to the joints. This allows healthier, less painful movement and activity.  An effective stretch should be held for at least 30 seconds.  A stretch should never be painful. You should only feel a gentle lengthening or release in the stretched tissue. FLEXION RANGE OF MOTION AND STRETCHING EXERCISES: STRETCH  Flexion, Single Knee to Chest   Lie on a firm bed or floor with both legs extended in front of you.  Keeping one leg in contact with the floor, bring your opposite knee to your chest. Hold your leg in place by either grabbing behind your thigh or at your knee.  Pull until you feel a gentle stretch in your low back. Hold __________ seconds.  Slowly release your grasp and repeat the exercise with the opposite side. Repeat __________ times. Complete this exercise __________ times per day.  STRETCH  Flexion, Double Knee to Chest  Lie on a  firm bed or floor with both legs extended in front of you.  Keeping one leg in contact with the floor, bring your opposite knee to your chest.  Tense your stomach muscles to support your back and then lift your other knee to your chest. Hold your legs in place by either grabbing behind your thighs or at your knees.  Pull both knees toward your chest until you feel a gentle stretch in your low back. Hold __________ seconds.  Tense your stomach muscles and slowly  return one leg at a time to the floor. Repeat __________ times. Complete this exercise __________ times per day.  STRETCH  Low Trunk Rotation  Lie on a firm bed or floor. Keeping your legs in front of you, bend your knees so they are both pointed toward the ceiling and your feet are flat on the floor.  Extend your arms out to the side. This will stabilize your upper body by keeping your shoulders in contact with the floor.  Gently and slowly drop both knees together to one side until you feel a gentle stretch in your low back. Hold for __________ seconds.  Tense your stomach muscles to support your lower back as you bring your knees back to the starting position. Repeat the exercise to the other side. Repeat __________ times. Complete this exercise __________ times per day  EXTENSION RANGE OF MOTION AND FLEXIBILITY EXERCISES: STRETCH  Extension, Prone on Elbows   Lie on your stomach on the floor, a bed will be too soft. Place your palms about shoulder width apart and at the height of your head.  Place your elbows under your shoulders. If this is too painful, stack pillows under your chest.  Allow your body to relax so that your hips drop lower and make contact more completely with the floor.  Hold this position for __________ seconds.  Slowly return to lying flat on the floor. Repeat __________ times. Complete this exercise __________ times per day.  RANGE OF MOTION  Extension, Prone Press Ups  Lie on your stomach on the floor, a bed will be too soft. Place your palms about shoulder width apart and at the height of your head.  Keeping your back as relaxed as possible, slowly straighten your elbows while keeping your hips on the floor. You may adjust the placement of your hands to maximize your comfort. As you gain motion, your hands will come more underneath your shoulders.  Hold this position __________ seconds.  Slowly return to lying flat on the floor. Repeat __________ times.  Complete this exercise __________ times per day.  RANGE OF MOTION- Quadruped, Neutral Spine   Assume a hands and knees position on a firm surface. Keep your hands under your shoulders and your knees under your hips. You may place padding under your knees for comfort.  Drop your head and point your tailbone toward the ground below you. This will round out your lower back like an angry cat. Hold this position for __________ seconds.  Slowly lift your head and release your tail bone so that your back sags into a large arch, like an old horse.  Hold this position for __________ seconds.  Repeat this until you feel limber in your low back.  Now, find your "sweet spot." This will be the most comfortable position somewhere between the two previous positions. This is your neutral spine. Once you have found this position, tense your stomach muscles to support your low back.  Hold this position for  __________ seconds. Repeat __________ times. Complete this exercise __________ times per day.  STRENGTHENING EXERCISES - Low Back Sprain These exercises may help you when beginning to rehabilitate your injury. These exercises should be done near your "sweet spot." This is the neutral, low-back arch, somewhere between fully rounded and fully arched, that is your least painful position. When performed in this safe range of motion, these exercises can be used for people who have either a flexion or extension based injury. These exercises may resolve your symptoms with or without further involvement from your physician, physical therapist or athletic trainer. While completing these exercises, remember:   Muscles can gain both the endurance and the strength needed for everyday activities through controlled exercises.  Complete these exercises as instructed by your physician, physical therapist or athletic trainer. Increase the resistance and repetitions only as guided.  You may experience muscle soreness or  fatigue, but the pain or discomfort you are trying to eliminate should never worsen during these exercises. If this pain does worsen, stop and make certain you are following the directions exactly. If the pain is still present after adjustments, discontinue the exercise until you can discuss the trouble with your caregiver. STRENGTHENING Deep Abdominals, Pelvic Tilt   Lie on a firm bed or floor. Keeping your legs in front of you, bend your knees so they are both pointed toward the ceiling and your feet are flat on the floor.  Tense your lower abdominal muscles to press your low back into the floor. This motion will rotate your pelvis so that your tail bone is scooping upwards rather than pointing at your feet or into the floor. With a gentle tension and even breathing, hold this position for __________ seconds. Repeat __________ times. Complete this exercise __________ times per day.  STRENGTHENING  Abdominals, Crunches   Lie on a firm bed or floor. Keeping your legs in front of you, bend your knees so they are both pointed toward the ceiling and your feet are flat on the floor. Cross your arms over your chest.  Slightly tip your chin down without bending your neck.  Tense your abdominals and slowly lift your trunk high enough to just clear your shoulder blades. Lifting higher can put excessive stress on the lower back and does not further strengthen your abdominal muscles.  Control your return to the starting position. Repeat __________ times. Complete this exercise __________ times per day.  STRENGTHENING  Quadruped, Opposite UE/LE Lift   Assume a hands and knees position on a firm surface. Keep your hands under your shoulders and your knees under your hips. You may place padding under your knees for comfort.  Find your neutral spine and gently tense your abdominal muscles so that you can maintain this position. Your shoulders and hips should form a rectangle that is parallel with the floor  and is not twisted.  Keeping your trunk steady, lift your right hand no higher than your shoulder and then your left leg no higher than your hip. Make sure you are not holding your breath. Hold this position for __________ seconds.  Continuing to keep your abdominal muscles tense and your back steady, slowly return to your starting position. Repeat with the opposite arm and leg. Repeat __________ times. Complete this exercise __________ times per day.  STRENGTHENING  Abdominals and Quadriceps, Straight Leg Raise   Lie on a firm bed or floor with both legs extended in front of you.  Keeping one leg in contact with the  floor, bend the other knee so that your foot can rest flat on the floor.  Find your neutral spine, and tense your abdominal muscles to maintain your spinal position throughout the exercise.  Slowly lift your straight leg off the floor about 6 inches for a count of 15, making sure to not hold your breath.  Still keeping your neutral spine, slowly lower your leg all the way to the floor. Repeat this exercise with each leg __________ times. Complete this exercise __________ times per day. POSTURE AND BODY MECHANICS CONSIDERATIONS - Low Back Sprain Keeping correct posture when sitting, standing or completing your activities will reduce the stress put on different body tissues, allowing injured tissues a chance to heal and limiting painful experiences. The following are general guidelines for improved posture. Your physician or physical therapist will provide you with any instructions specific to your needs. While reading these guidelines, remember:  The exercises prescribed by your provider will help you have the flexibility and strength to maintain correct postures.  The correct posture provides the best environment for your joints to work. All of your joints have less wear and tear when properly supported by a spine with good posture. This means you will experience a healthier, less  painful body.  Correct posture must be practiced with all of your activities, especially prolonged sitting and standing. Correct posture is as important when doing repetitive low-stress activities (typing) as it is when doing a single heavy-load activity (lifting). RESTING POSITIONS Consider which positions are most painful for you when choosing a resting position. If you have pain with flexion-based activities (sitting, bending, stooping, squatting), choose a position that allows you to rest in a less flexed posture. You would want to avoid curling into a fetal position on your side. If your pain worsens with extension-based activities (prolonged standing, working overhead), avoid resting in an extended position such as sleeping on your stomach. Most people will find more comfort when they rest with their spine in a more neutral position, neither too rounded nor too arched. Lying on a non-sagging bed on your side with a pillow between your knees, or on your back with a pillow under your knees will often provide some relief. Keep in mind, being in any one position for a prolonged period of time, no matter how correct your posture, can still lead to stiffness. PROPER SITTING POSTURE In order to minimize stress and discomfort on your spine, you must sit with correct posture. Sitting with good posture should be effortless for a healthy body. Returning to good posture is a gradual process. Many people can work toward this most comfortably by using various supports until they have the flexibility and strength to maintain this posture on their own. When sitting with proper posture, your ears will fall over your shoulders and your shoulders will fall over your hips. You should use the back of the chair to support your upper back. Your lower back will be in a neutral position, just slightly arched. You may place a small pillow or folded towel at the base of your lower back for  support.  When working at a desk,  create an environment that supports good, upright posture. Without extra support, muscles tire, which leads to excessive strain on joints and other tissues. Keep these recommendations in mind: CHAIR:  A chair should be able to slide under your desk when your back makes contact with the back of the chair. This allows you to work closely.  The chair's  height should allow your eyes to be level with the upper part of your monitor and your hands to be slightly lower than your elbows. BODY POSITION  Your feet should make contact with the floor. If this is not possible, use a foot rest.  Keep your ears over your shoulders. This will reduce stress on your neck and low back. INCORRECT SITTING POSTURES  If you are feeling tired and unable to assume a healthy sitting posture, do not slouch or slump. This puts excessive strain on your back tissues, causing more damage and pain. Healthier options include:  Using more support, like a lumbar pillow.  Switching tasks to something that requires you to be upright or walking.  Talking a brief walk.  Lying down to rest in a neutral-spine position. PROLONGED STANDING WHILE SLIGHTLY LEANING FORWARD  When completing a task that requires you to lean forward while standing in one place for a long time, place either foot up on a stationary 2-4 inch high object to help maintain the best posture. When both feet are on the ground, the lower back tends to lose its slight inward curve. If this curve flattens (or becomes too large), then the back and your other joints will experience too much stress, tire more quickly, and can cause pain. CORRECT STANDING POSTURES Proper standing posture should be assumed with all daily activities, even if they only take a few moments, like when brushing your teeth. As in sitting, your ears should fall over your shoulders and your shoulders should fall over your hips. You should keep a slight tension in your abdominal muscles to brace your  spine. Your tailbone should point down to the ground, not behind your body, resulting in an over-extended swayback posture.  INCORRECT STANDING POSTURES  Common incorrect standing postures include a forward head, locked knees and/or an excessive swayback. WALKING Walk with an upright posture. Your ears, shoulders and hips should all line-up. PROLONGED ACTIVITY IN A FLEXED POSITION When completing a task that requires you to bend forward at your waist or lean over a low surface, try to find a way to stabilize 3 out of 4 of your limbs. You can place a hand or elbow on your thigh or rest a knee on the surface you are reaching across. This will provide you more stability, so that your muscles do not tire as quickly. By keeping your knees relaxed, or slightly bent, you will also reduce stress across your lower back. CORRECT LIFTING TECHNIQUES DO :  Assume a wide stance. This will provide you more stability and the opportunity to get as close as possible to the object which you are lifting.  Tense your abdominals to brace your spine. Bend at the knees and hips. Keeping your back locked in a neutral-spine position, lift using your leg muscles. Lift with your legs, keeping your back straight.  Test the weight of unknown objects before attempting to lift them.  Try to keep your elbows locked down at your sides in order get the best strength from your shoulders when carrying an object.  Always ask for help when lifting heavy or awkward objects. INCORRECT LIFTING TECHNIQUES DO NOT:   Lock your knees when lifting, even if it is a small object.  Bend and twist. Pivot at your feet or move your feet when needing to change directions.  Assume that you can safely pick up even a paperclip without proper posture. Document Released: 01/12/2005 Document Revised: 04/06/2011 Document Reviewed: 04/26/2008 [employer] Patient Information  2014 [place], [place].

## 2013-03-24 ENCOUNTER — Other Ambulatory Visit: Payer: Self-pay | Admitting: Family Medicine

## 2013-04-18 ENCOUNTER — Ambulatory Visit (INDEPENDENT_AMBULATORY_CARE_PROVIDER_SITE_OTHER): Payer: Self-pay | Admitting: *Deleted

## 2013-04-18 VITALS — BP 133/74 | HR 74 | Temp 97.7°F | Resp 16 | Wt 135.5 lb

## 2013-04-18 DIAGNOSIS — B20 Human immunodeficiency virus [HIV] disease: Secondary | ICD-10-CM

## 2013-04-18 DIAGNOSIS — Z21 Asymptomatic human immunodeficiency virus [HIV] infection status: Secondary | ICD-10-CM

## 2013-04-18 LAB — COMPREHENSIVE METABOLIC PANEL
ALBUMIN: 4.3 g/dL (ref 3.5–5.2)
ALT: 15 U/L (ref 0–35)
AST: 15 U/L (ref 0–37)
Alkaline Phosphatase: 70 U/L (ref 39–117)
BUN: 12 mg/dL (ref 6–23)
CALCIUM: 9 mg/dL (ref 8.4–10.5)
CHLORIDE: 105 meq/L (ref 96–112)
CO2: 25 mEq/L (ref 19–32)
Creat: 0.67 mg/dL (ref 0.50–1.10)
Glucose, Bld: 79 mg/dL (ref 70–99)
POTASSIUM: 4.2 meq/L (ref 3.5–5.3)
SODIUM: 137 meq/L (ref 135–145)
TOTAL PROTEIN: 7.3 g/dL (ref 6.0–8.3)
Total Bilirubin: 0.3 mg/dL (ref 0.2–1.2)

## 2013-04-18 LAB — PHOSPHORUS: Phosphorus: 4.2 mg/dL (ref 2.3–4.6)

## 2013-04-18 NOTE — Progress Notes (Signed)
Connie Wade for [id number] week 68. She c/o of panic attacks that began January 2015 and has had 3 since then. Her husband passed in November 2014 from cancer. She said this was hard for her. She feels they are brought on by unexpected events that happen causing her to adjust her schedule that she has planned out. She stated he cousin passed away and her funeral is this Thursday and since she has to drive to [place] to be there it has changed her schedule. She feels this event caused her to have a panic attack this weekend. She is not interested in taking medication to help her with this. I suggested that during an attack she focus solely on breathing in and out and placing her hand on her chest so she can feel the rise and fall of her breath. She said that makes sense and would try that next time. She does see a hospice counselor and said she would mention all this to the at there next meeting.   The boil on her back has cleared and she has a small scar in place. Fasting labs were drawn. Her vital signs are with in normal limits. She has gained weight and has been trying to eat even though her appetite continues to be suppressed. She has decided to participate in step 2 of the study and wants to register to receive study drug (COMPLERA). She stated she has not missed a day of her complera. She has been entered into step 2 of A5314. Medications were dispensed. Next appointment scheduled for Tuesday, July 11, 2013 @ 10am. [name] RN

## 2013-05-15 ENCOUNTER — Encounter: Payer: Self-pay | Admitting: Infectious Disease

## 2013-05-15 LAB — CD4/CD8 (T-HELPER/T-SUPPRESSOR CELL)
CD4%: 45.3
CD4: 1042

## 2013-05-15 LAB — HIV-1 RNA QUANT-NO REFLEX-BLD: HIV-1 RNA Viral Load: <40

## 2013-06-05 ENCOUNTER — Encounter: Payer: Self-pay | Admitting: Infectious Disease

## 2013-07-11 ENCOUNTER — Ambulatory Visit (INDEPENDENT_AMBULATORY_CARE_PROVIDER_SITE_OTHER): Payer: Self-pay | Admitting: *Deleted

## 2013-07-11 VITALS — BP 120/79 | HR 71 | Temp 98.3°F | Resp 14 | Wt 133.5 lb

## 2013-07-11 DIAGNOSIS — Z21 Asymptomatic human immunodeficiency virus [HIV] infection status: Secondary | ICD-10-CM

## 2013-07-11 DIAGNOSIS — B20 Human immunodeficiency virus [HIV] disease: Secondary | ICD-10-CM

## 2013-07-11 NOTE — Progress Notes (Signed)
Connie Wade is here for her week 86 [id number] study visit. She denies any new problems except for stress from work and having her cousin live with her. She is interested in [employer] and we will check with both study teams and see if she is eligible to participate while she is still in [phone]. She has 36 more weeks left for this study in any case and could go on after she comes off this one. She will return in September for the next visit,

## 2013-08-09 ENCOUNTER — Ambulatory Visit: Payer: Managed Care, Other (non HMO) | Admitting: Infectious Disease

## 2013-08-09 ENCOUNTER — Ambulatory Visit: Payer: No Typology Code available for payment source

## 2013-08-15 ENCOUNTER — Encounter: Payer: Self-pay | Admitting: Infectious Disease

## 2013-08-15 ENCOUNTER — Ambulatory Visit (INDEPENDENT_AMBULATORY_CARE_PROVIDER_SITE_OTHER): Payer: No Typology Code available for payment source | Admitting: Infectious Disease

## 2013-08-15 ENCOUNTER — Ambulatory Visit
Admission: RE | Admit: 2013-08-15 | Discharge: 2013-08-15 | Disposition: A | Discharge status: Home / Self Care | Payer: No Typology Code available for payment source | Source: Ambulatory Visit | Attending: Infectious Disease | Admitting: Infectious Disease

## 2013-08-15 VITALS — BP 144/104 | HR 76 | Temp 98.6°F | Wt 130.8 lb

## 2013-08-15 DIAGNOSIS — M79609 Pain in unspecified limb: Secondary | ICD-10-CM

## 2013-08-15 DIAGNOSIS — M109 Gout, unspecified: Secondary | ICD-10-CM

## 2013-08-15 DIAGNOSIS — B20 Human immunodeficiency virus [HIV] disease: Secondary | ICD-10-CM

## 2013-08-15 DIAGNOSIS — M79674 Pain in right toe(s): Secondary | ICD-10-CM

## 2013-08-15 NOTE — Patient Instructions (Signed)
You may have gout:  We will do some blood work and an xray:  You can try diet modifications including cutting out alcohol (see below)  If this doesn't work we may need to use colchicine  What is gout? - Gout is a form of arthritis. It can cause pain and swelling in the joints. At first, it tends to affect only one joint - most frequently in the big toe. It happens in people who have too much uric acid in the blood. Uric acid is a chemical that is produced when the body breaks down certain foods. Uric acid can form sharp needle-like crystals that build up in the joints and cause pain. Uric acid crystals can also form inside the tubes that carry urine from the kidneys to the bladder. These crystals can turn into "kidney stones" that can cause pain and problems with the flow of urine.  What are the symptoms of gout? - People with gout get sudden attacks of severe pain, most often the big toe, ankle, or knee. Often the joint also turns red and swells. Usually, only one joint is affected, but some people have pain in more than one joint. Gout attacks tend to happen more often during the night.  The pain from gout can be extreme. The pain and swelling are worst at the beginning of a gout attack. The symptoms then get better within a few days to weeks. It is not clear how the body "turns off" a gout attack.  Is there a test for gout? - Yes. To test you for gout, your doctor or nurse can take a sample of fluid from the joint that is in pain. If he or she finds typical gout crystals in the fluid, then you have gout. Even without checking fluid from a joint, the doctor or nurse might still strongly suspect gout if:  ?You have had pain and swelling in one joint, especially the joint at the base of the big toe ?Your symptoms completely go away between attacks, at least when you first start having them ?Your blood tests show high levels of uric acid How is gout treated? - There are a few medicines that can  reduce the pain and swelling caused by gout. When you find one that works for you, make sure to keep it on hand all the time. That way you can take it as soon you feel an attack starting. Gout medicines work best if you take them as soon as symptoms start.  The medicines used to treat attacks of gout include:  ?NSAIDs - This is a large group of medicines that includes ibuprofen (sample brand names: Advil, Motrin) and indomethacin (brand name: Indocin). NSAIDs might not be safe for people with kidney or liver disease, or for people who have bleeding problems. ?Colchicine - This medicine helps with gout but it can also cause diarrhea, nausea, vomiting, and stomach pain. Doctors and nurses usually give this medicine to people who cannot take NSAIDs. ?Steroids - Your doctor or nurse might suggest medicines called steroids if you cannot take NSAIDs or colchicine. These steroids are not the kind that athletes take to build up muscle. These steroids reduce swelling and pain. Steroids come in shots or as pills. Are there medicines to prevent gout attacks? - Yes, there are medicines that can reduce the chances of having future gout attacks. Most people who have repeated or severe attacks of gout need to take these medicines. In general, they all work by reducing the  amount of uric acid in the blood. Examples of these medicines include allopurinol (brand names: Aloprim, Zyloprim), febuxostat (brand name: Uloric), and probenecid. People with severe gout can also get a medicine called pegloticase (brand name: Normand Sloop), which is given through a vein. This medicine can cause an allergic reaction in some people.  If you take one of the medicines to prevent gout, your doctor or nurse will want to make sure that you use it safely. He or she might also want to check that your uric acid level gets low enough to dissolve the gout crystals. Allopurinol, febuxostat, and probenecid can actually increase gout attacks when you  first start taking them. To prevent these attacks, your doctor or nurse might suggest that you take low doses of colchicine when you start the medicines. This will give the gout crystals time to dissolve, and that will put a stop to the attacks.  Can I do anything on my own to prevent gout attacks? - Yes. If you are overweight, losing weight can help relieve gout. Plus, you can make changes to your diet that might help prevent future attacks.  You should cut down on:  ?Red meat and seafood ?Alcohol, such as beer, wine, and hard alcohol ?Foods and drinks that have high-fructose corn syrup (that includes most sodas and store-bought cakes and cookies) Instead, you should eat lots of:  ?Low-fat dairy products, such as low-fat milk, cheese, and yogurt ?Whole grains and vegetables More on this topic  Patient information: Calcium pyrophosphate deposition disease (pseudogout) (The Basics)  Patient information: Gout (Beyond the Basics) Patient information: Pseudogout (Beyond the Basics)  All topics are updated as new evidence becomes available and our peer review process is complete. This topic retrieved from UpToDate on: Aug 15, 2013.

## 2013-08-15 NOTE — Progress Notes (Signed)
Subjective:    Patient ID: Connie Wade, female    DOB: Sep 16, 1955, 58 y.o.   MRN: [id number]  Toe Pain  The incident occurred more than 1 week ago. The incident occurred at home. There was no injury mechanism. The pain is present in the right toes. The quality of the pain is described as aching. The pain is at a severity of 4/10. The pain is moderate. The pain has been fluctuating since onset. She reports no foreign bodies present. The symptoms are aggravated by weight bearing and palpation. She has tried acetaminophen and NSAIDs for the symptoms. The treatment provided no relief.    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   Lab Results  Component Value Date   HIV1RNAQUANT <20 02/12/2012   Lab Results  Component Value Date   CD4TABS 4081 04/18/2013   CD4TABS 1578 01/31/2013   CD4TABS 1209 11/01/2012    She comes in today complaining of right toe pain is gone on office at last several weeks. It is severe she cannot tolerate even the sheets coming over at. She states the trying ibuprofen at 600 mg provides no relief. Instead she states that ingesting cherries allows her to get relief from the pain was then gradually dies down and recurs. She has had no trauma to the area no fevers or chills return to suggest infection. She's not on thiazide diuretic but she does need meat and drinks wine as well at times.      Review of Systems  Constitutional: Negative for fever, chills, diaphoresis, activity change, fatigue and unexpected weight change.  HENT: Negative for congestion, rhinorrhea, sinus pressure, sneezing, sore throat and trouble swallowing.   Eyes: Negative for photophobia and visual disturbance.  Respiratory: Negative for cough, chest tightness, shortness of breath, wheezing and stridor.   Cardiovascular: Negative for chest pain, palpitations and leg swelling.  Gastrointestinal: Negative for nausea,  vomiting, abdominal pain, diarrhea, constipation, blood in stool, abdominal distention and anal bleeding.  Genitourinary: Negative for dysuria, hematuria, flank pain and difficulty urinating.  Musculoskeletal: Positive for joint swelling. Negative for arthralgias, back pain, gait problem and myalgias.  Skin: Negative for color change, pallor, rash and wound.  Neurological: Negative for dizziness, tremors, weakness and light-headedness.  Hematological: Negative for adenopathy. Does not bruise/bleed easily.  Psychiatric/Behavioral: Negative for suicidal ideas, behavioral problems, confusion, sleep disturbance, dysphoric mood, decreased concentration and agitation.       Objective:   Physical Exam  Constitutional: She is oriented to person, place, and time. She appears well-developed and well-nourished. No distress.  HENT:  Head: Normocephalic and atraumatic.  Mouth/Throat: Oropharynx is clear and moist. No oropharyngeal exudate.  Eyes: Conjunctivae and EOM are normal. Pupils are equal, round, and reactive to light. No scleral icterus.  Neck: Normal range of motion. Neck supple. No JVD present.  Cardiovascular: Normal rate, regular rhythm and normal heart sounds.  Exam reveals no gallop and no friction rub.   No murmur heard. Pulmonary/Chest: Effort normal and breath sounds normal. No respiratory distress. She has no wheezes. She has no rales. She exhibits no tenderness.  Abdominal: She exhibits no distension and no mass. There is no tenderness. There is no rebound and no guarding.  Musculoskeletal: She exhibits no edema and no tenderness.       Feet:  Lymphadenopathy:    She has no cervical adenopathy.  Neurological: She is alert and oriented to person, place, and  time. She exhibits normal muscle tone. Coordination normal.  Skin: Skin is warm and dry. She is not diaphoretic. No erythema. No pallor.  Psychiatric: Her behavior is normal. Judgment and thought content normal. She exhibits a  depressed mood.          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 When she finishes the initial phase will have to decide whether to remain on complera or not.   Toe pain: Check plain films and a uric acid level give her handout regarding gout in case this is of gout, maneuvers can be made in terms of diet. She is not interested in actual medicines to treat or prevent gout. I spent greater than 25 minutes with the patient including greater than 50% of time in face to face counsel of the patient and in coordination of their care.

## 2013-08-17 ENCOUNTER — Encounter: Payer: Self-pay | Admitting: Infectious Disease

## 2013-08-17 LAB — CD4/CD8 (T-HELPER/T-SUPPRESSOR CELL)
CD4%: 53.1
CD4: 1540
CD8 % Suppressor T Cell: 43.1
CD8: 1250

## 2013-08-17 LAB — HIV-1 RNA QUANT-NO REFLEX-BLD: HIV-1 RNA Viral Load: <40

## 2013-09-03 ENCOUNTER — Emergency Department (INDEPENDENT_AMBULATORY_CARE_PROVIDER_SITE_OTHER)
Admission: EM | Admit: 2013-09-03 | Discharge: 2013-09-03 | Disposition: A | Discharge status: Home / Self Care | Payer: No Typology Code available for payment source | Source: Home / Self Care | Attending: Emergency Medicine | Admitting: Emergency Medicine

## 2013-09-03 ENCOUNTER — Encounter (HOSPITAL_COMMUNITY): Payer: Self-pay | Admitting: Emergency Medicine

## 2013-09-03 DIAGNOSIS — K529 Noninfective gastroenteritis and colitis, unspecified: Secondary | ICD-10-CM

## 2013-09-03 DIAGNOSIS — K5289 Other specified noninfective gastroenteritis and colitis: Secondary | ICD-10-CM

## 2013-09-03 LAB — POCT I-STAT, CHEM 8
BUN: 14 mg/dL (ref 6–23)
Calcium, Ion: 1.13 mmol/L (ref 1.13–1.30)
Chloride: 105 mEq/L (ref 96–112)
Creatinine, Ser: 0.8 mg/dL (ref 0.50–1.10)
Glucose, Bld: 101 mg/dL — ABNORMAL HIGH (ref 70–99)
HEMATOCRIT: 47 % — AB (ref 36.0–46.0)
HEMOGLOBIN: 16 g/dL — AB (ref 12.0–15.0)
POTASSIUM: 3.5 meq/L — AB (ref 3.7–5.3)
SODIUM: 140 meq/L (ref 137–147)
TCO2: 23 mmol/L (ref 0–100)

## 2013-09-03 MED ORDER — ACETAMINOPHEN 325 MG PO TABS
ORAL_TABLET | ORAL | Status: AC
  Filled 2013-09-03: qty 2

## 2013-09-03 MED ORDER — ONDANSETRON 4 MG PO TBDP: 4.0000 mg | ORAL_TABLET | Freq: Three times a day (TID) | ORAL | Status: DC | PRN

## 2013-09-03 MED ORDER — SODIUM CHLORIDE 0.9 % IV BOLUS (SEPSIS)
1000.0000 mL | Freq: Once | INTRAVENOUS | Status: AC
  Administered 2013-09-03: 1000 mL via INTRAVENOUS

## 2013-09-03 MED ORDER — ONDANSETRON HCL 4 MG/2ML IJ SOLN
INTRAMUSCULAR | Status: AC
  Filled 2013-09-03: qty 2

## 2013-09-03 MED ORDER — ACETAMINOPHEN 325 MG PO TABS
650.0000 mg | ORAL_TABLET | Freq: Once | ORAL | Status: AC
  Administered 2013-09-03: 650 mg via ORAL

## 2013-09-03 MED ORDER — ONDANSETRON HCL 4 MG/2ML IJ SOLN
4.0000 mg | Freq: Once | INTRAMUSCULAR | Status: AC
  Administered 2013-09-03: 4 mg via INTRAVENOUS

## 2013-09-03 MED ORDER — ONDANSETRON 4 MG PO TBDP: 8.0000 mg | ORAL_TABLET | Freq: Once | ORAL | Status: DC

## 2013-09-03 NOTE — ED Notes (Signed)
Pt  Reports  Vomited  All  Day  Yesterday      With  Associated headache      -Pt  Reports  Has  Had  Low  abd  Pain  As  Well for  What  She  Describes  As  A  While      At this  Time  Pt is  Sitting  Upright on the  Exam table  Speaking in  Complete  sentances  And  Is  In no  Acute  Distress

## 2013-09-03 NOTE — ED Notes (Signed)
Iv  Ns   1 liter    Bolus   20n angio  r  Arm  1  Att    Site  patent

## 2013-09-03 NOTE — ED Provider Notes (Signed)
CSN: [id number]     Arrival date & time 09/03/13  1145 History   First MD Initiated Contact with Patient 09/03/13 1159     Chief Complaint  Patient presents with  . Headache   (Consider location/radiation/quality/duration/timing/severity/associated sxs/prior Treatment) HPI She is a 58 year old woman here today for evaluation of vomiting and headache. She states yesterday she had multiple episodes of nonbloody nonbilious vomiting. This is associated with abdominal pain and diarrhea. She states the diarrhea has resolved. The vomiting seems to be better today. Last emesis 6am today. She is able to tolerate small sips of fluids today. She denies any fevers or chills at home. She also describes a frontal headache. It is described as a dull ache. It has gradually worsened since yesterday. No associated vision changes or focal neurologic deficits.  She is HIV positive, reports this is well-controlled. She's compliant with her medications.  Past Medical History  Diagnosis Date  . HIV 03/25/2006    since 2000  . HEPATITIS B 03/25/2006  . HYPERLIPIDEMIA 03/25/2006  . UTERINE FIBROID 03/25/2006  . ANEMIA, OTHER, UNSPECIFIED 03/25/2006  . TOBACCO DEPENDENCE 03/25/2006  . DEPRESSION 03/17/2007  . HYPERTENSION, BENIGN ESSENTIAL 04/26/2007  . HYDRADENITIS 03/25/2006  . Tubular adenoma of colon 06/23/2011    Benign, no high grade dysplasia 06/16/11 Followed by [name] Dr. Watt Climes Repeat in 5 years (due 2018)   Past Surgical History  Procedure Laterality Date  . Ovarian cyst removal  2001   Family History  Problem Relation Age of Onset  . COPD Sister   . Arthritis Mother   . Alcohol abuse Father    History  Substance Use Topics  . Smoking status: Current Every Day Smoker -- 1.00 packs/day    Types: Cigarettes  . Smokeless tobacco: Not on file  . Alcohol Use: 0.6 oz/week    1 Glasses of wine per week     Comment: everday   OB History   Grav Para Term Preterm Abortions TAB SAB Ect Mult Living            Review of Systems  Constitutional: Positive for appetite change. Negative for fever and chills.  Respiratory: Negative.   Cardiovascular: Negative.   Gastrointestinal: Positive for nausea, vomiting, abdominal pain and diarrhea. Negative for blood in stool.  Musculoskeletal: Negative.   Neurological: Positive for headaches. Negative for dizziness, weakness and numbness.    Allergies  Review of patient's allergies indicates no known allergies.  Home Medications   Prior to Admission medications   Medication Sig Start Date End Date Taking? Authorizing Provider  acetaminophen (ACETAMINOPHEN EXTRA STRENGTH) 500 MG tablet Take 1 tablet (500 mg total) by mouth every 6 (six) hours as needed. 03/07/13   Kandis Nab, MD  albuterol (PROVENTIL HFA;VENTOLIN HFA) 108 (90 BASE) MCG/ACT inhaler Inhale 2 puffs into the lungs every 6 (six) hours as needed for wheezing. 04/27/12   Cletus Gash, MD  cyclobenzaprine (FLEXERIL) 10 MG tablet Take 1 tablet (10 mg total) by mouth 3 (three) times daily as needed for muscle spasms. 03/07/13   Kandis Nab, MD  DM-GG-Chlorphen-Acetaminophen (CORICIDIN HBP DAY/NIGHT COLD PO) Take 2 tablets by mouth every 8 (eight) hours as needed (for cold symptoms).    Historical Provider, MD  doxycycline (VIBRAMYCIN) 100 MG capsule Take 1 capsule (100 mg total) by mouth 2 (two) times daily. 09/13/12   Brent General, PA-C  Emtricitab-Rilpivir-Tenofovir 200-25-300 MG TABS Take 1 tablet by mouth daily. 02/09/13   Truman Hayward, MD  Guaifenesin 1200 MG TB12 Take 1 tablet (1,200 mg total) by mouth 2 (two) times daily. 09/13/12   Resa Miner Lawyer, PA-C  lisinopril (PRINIVIL,ZESTRIL) 10 MG tablet TAKE 1 TABLET (10 MG TOTAL) BY MOUTH DAILY. 03/24/13   Olam Idler, MD  naproxen (NAPROSYN) 250 MG tablet Take 1 tablet (250 mg total) by mouth 2 (two) times daily with a meal. 03/07/13   Kandis Nab, MD  ondansetron (ZOFRAN ODT) 4 MG disintegrating tablet  Take 1 tablet (4 mg total) by mouth every 8 (eight) hours as needed for nausea or vomiting. 09/03/13   Melony Overly, MD  promethazine-dextromethorphan (PROMETHAZINE-DM) 6.25-15 MG/5ML syrup Take 5 mL by mouth 4 (four) times daily as needed for cough. 09/13/12   Resa Miner Lawyer, PA-C   BP 139/86  Pulse 93  Temp(Src) 100 F (37.8 C) (Oral)  Resp 16  SpO2 98% Physical Exam  Constitutional: She is oriented to person, place, and time. She appears well-developed and well-nourished. No distress.  HENT:  Mucous membranes slightly dry  Neck: Normal range of motion. Neck supple.  Cardiovascular: Normal rate, regular rhythm and normal heart sounds.  Exam reveals no gallop.   No murmur heard. Pulmonary/Chest: Effort normal and breath sounds normal. No respiratory distress. She has no wheezes. She has no rales.  Abdominal: Soft. Bowel sounds are normal. She exhibits no distension and no mass. There is tenderness (mild epigastric). There is no rebound and no guarding.  Musculoskeletal: She exhibits no edema.  Neurological: She is alert and oriented to person, place, and time.  Skin: Skin is warm and dry. No rash noted.  Mild turgor    ED Course  Procedures (including critical care time) Labs Review Labs Reviewed  POCT I-STAT, CHEM 8 - Abnormal; Notable for the following:    Potassium 3.5 (*)    Glucose, Bld 101 (*)    Hemoglobin 16.0 (*)    HCT 47.0 (*)    All other components within normal limits    Imaging Review No results found.   MDM   1. Gastroenteritis    She appears mildly dehydrated. I suspect this is the cause of her headache. Will give her a liter of IV fluids. Also Zofran 8 mg ODT. Check i-STAT for electrolytes and kidney function.  I-STAT shows mild hypokalemia at 3.5. Otherwise normal. She reports feeling a little better after the fluids. She also states her headache is improved. Will give Tylenol for the headache prior to discharge.  Prescription for Zofran  provided. Discussed importance of fluid intake. Reviewed reasons to return as in after visit summary. Followup if not improving in the next few days.   Melony Overly, MD 09/03/13 1341

## 2013-09-03 NOTE — Discharge Instructions (Signed)
You likely have a stomach bug. Take zofran every 8 hours as needed for nausea and vomiting. The headache is likely from some mild dehydration. It is okay to take tylenol.  Avoid ibuprofen as it will upset your stomach. Focus on drinking fluids - water, clear soda, chicken broth. You should start to feel better in the next day or so.  If you are getting fevers, are unable to keep liquids down, see blood in the vomit, or are just not getting better, please come back.

## 2013-09-04 ENCOUNTER — Encounter: Payer: Self-pay | Admitting: *Deleted

## 2013-09-15 ENCOUNTER — Other Ambulatory Visit: Payer: Self-pay | Admitting: Family Medicine

## 2013-09-22 ENCOUNTER — Other Ambulatory Visit: Payer: Self-pay | Admitting: Family Medicine

## 2013-10-10 ENCOUNTER — Ambulatory Visit: Payer: No Typology Code available for payment source

## 2013-10-16 ENCOUNTER — Other Ambulatory Visit: Payer: Self-pay | Admitting: *Deleted

## 2013-10-16 IMAGING — CR DG LUMBAR SPINE COMPLETE 4+V
5 series · 5 of 5 positions shown · non-contrast
Comparison: None.

CLINICAL DATA: Low back pain

LUMBAR SPINE - COMPLETE 4+ VIEW

[view not recorded (1 of 5)]
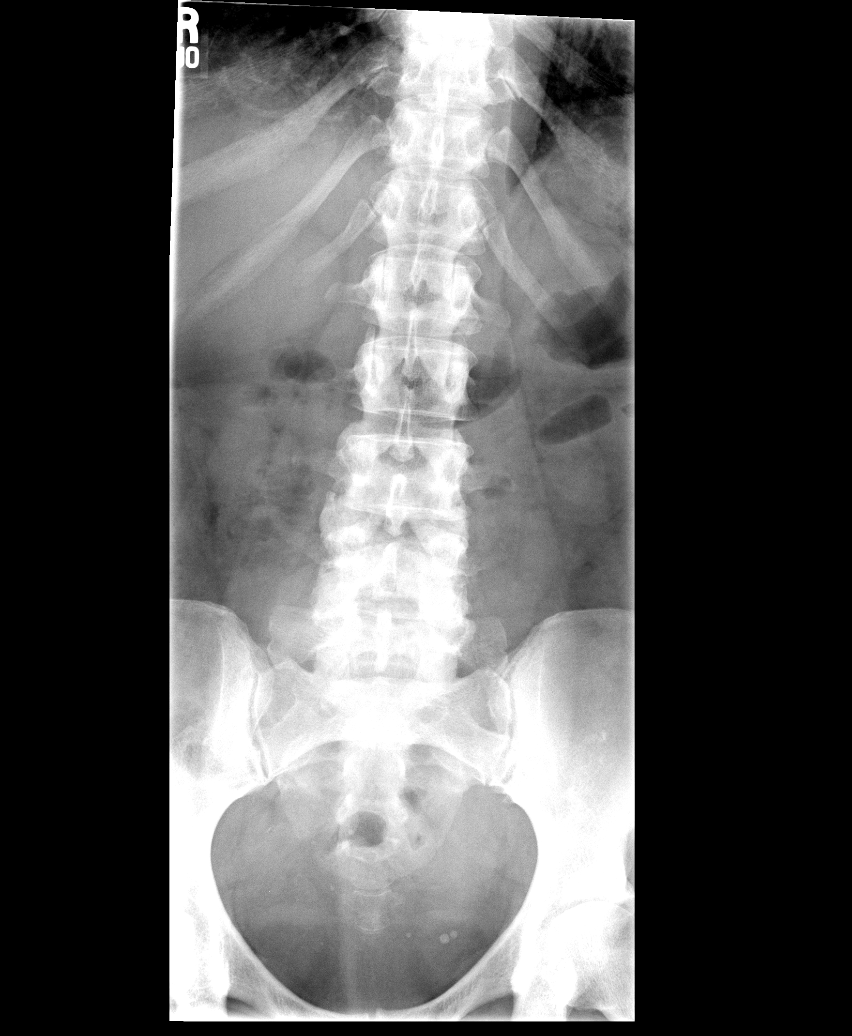

[view not recorded (2 of 5)]
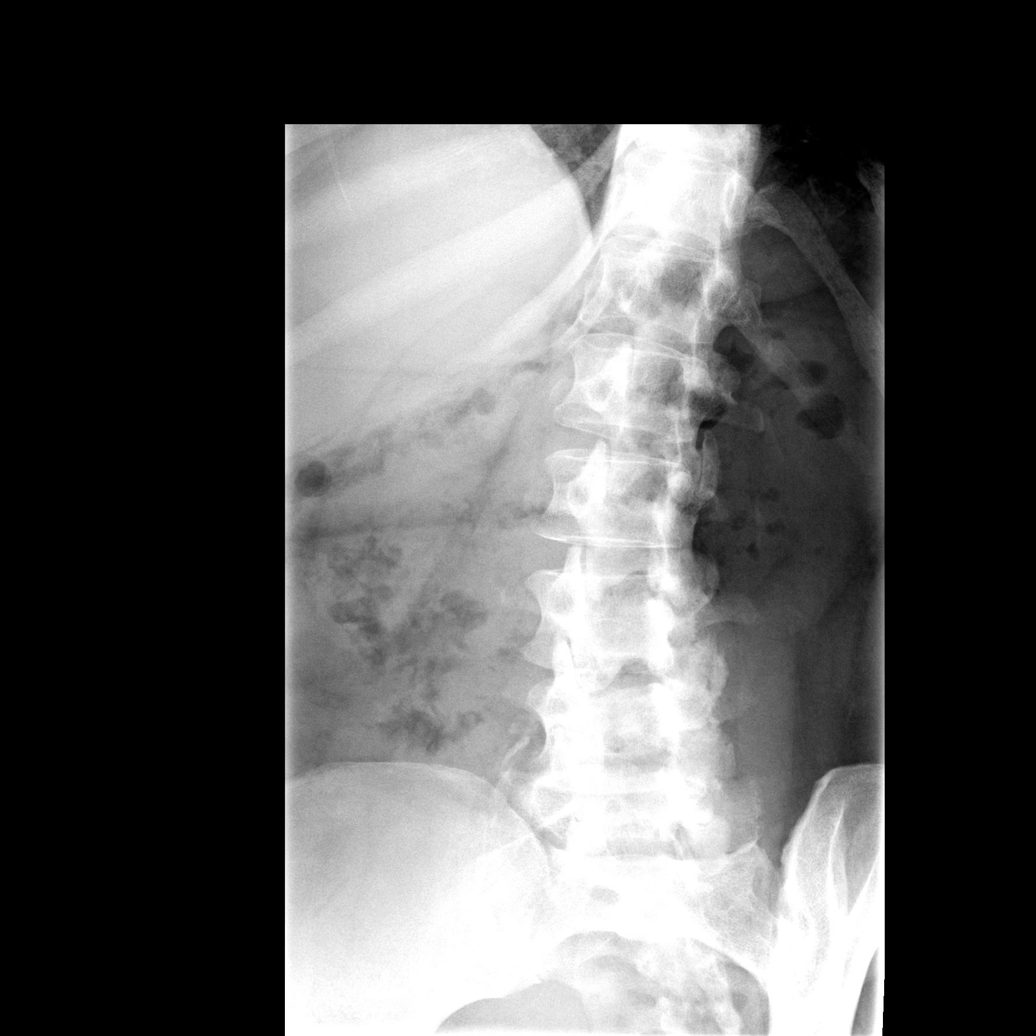

[view not recorded (3 of 5)]
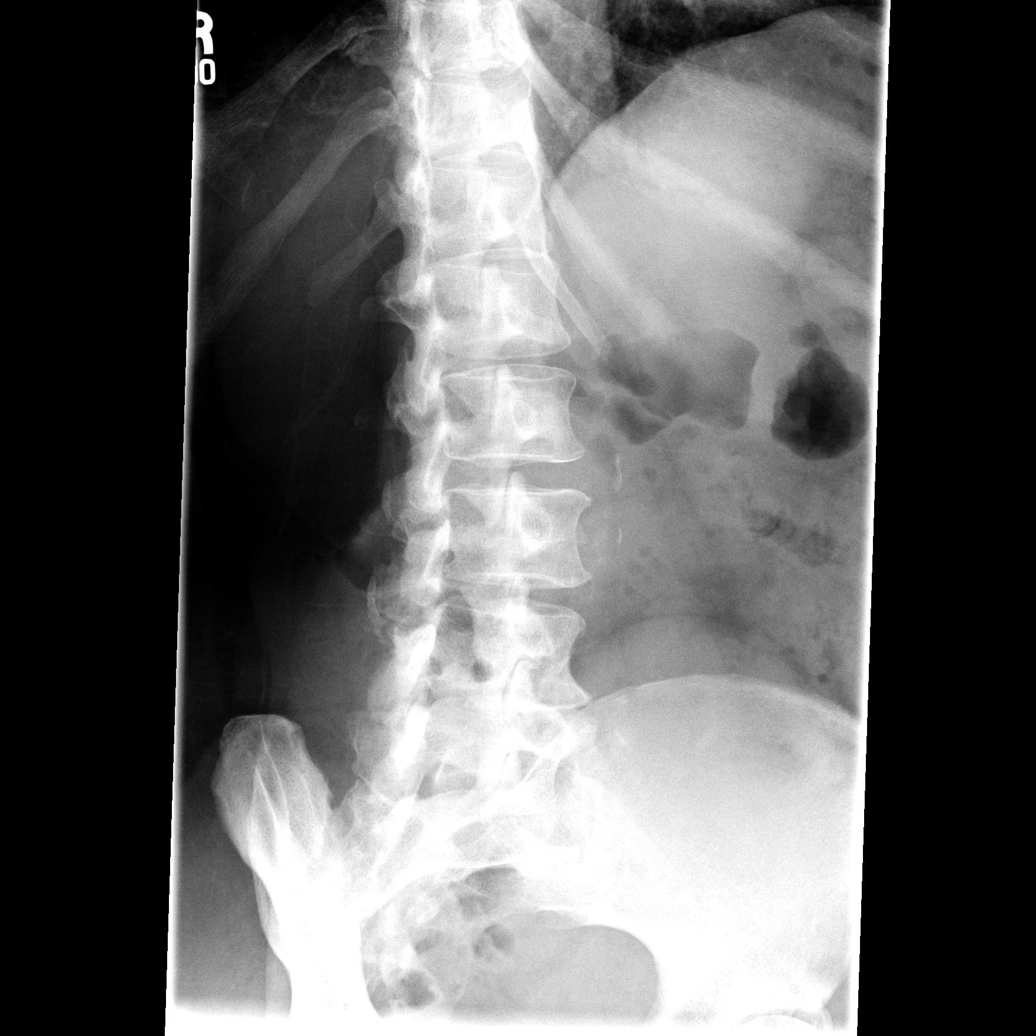

[view not recorded (4 of 5)]
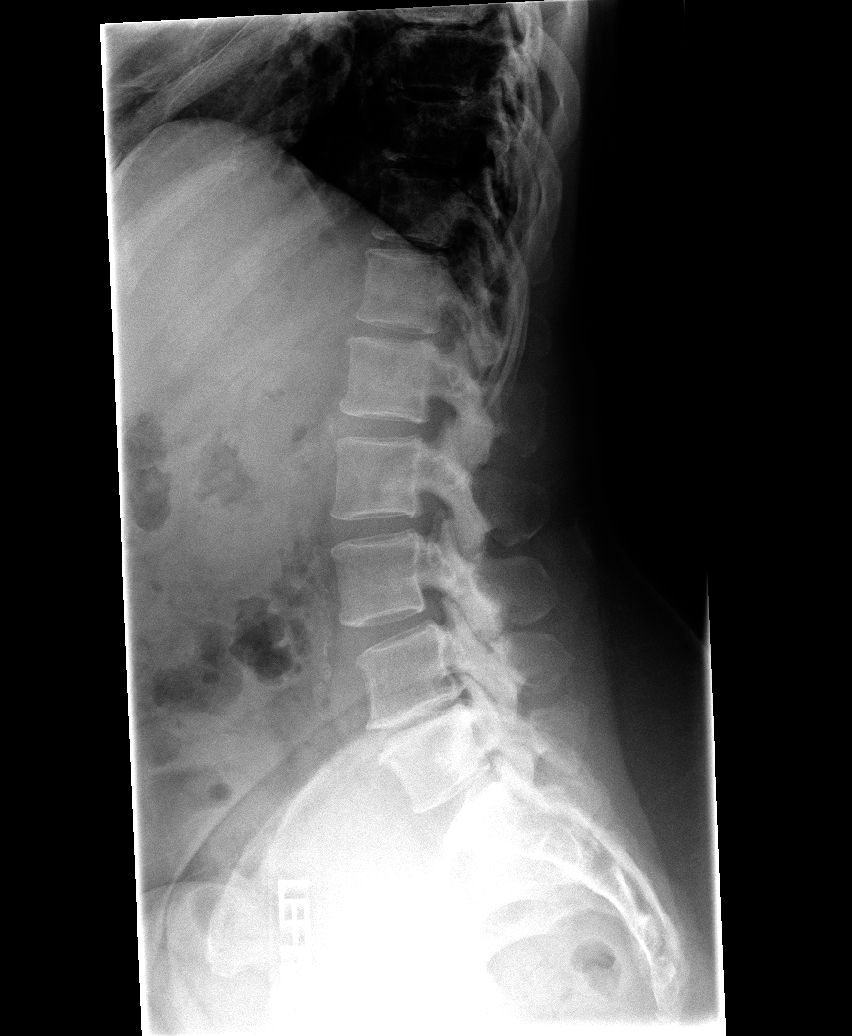

[view not recorded (5 of 5)]
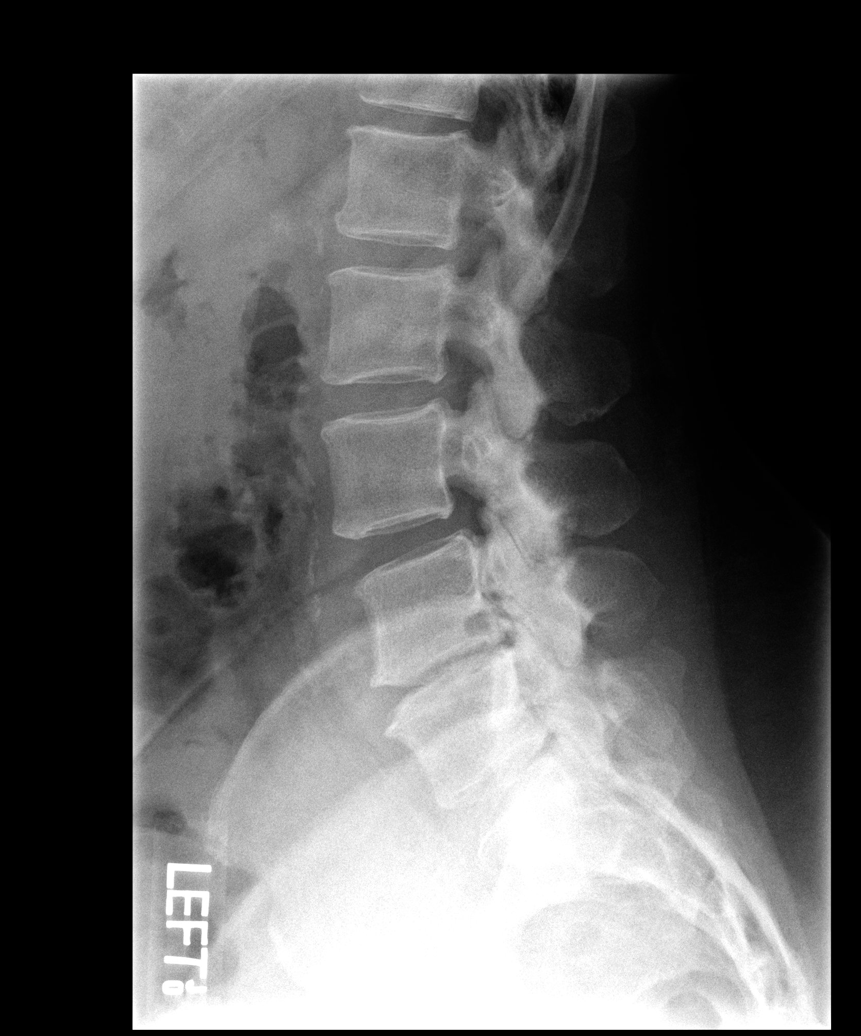

[5 of 5 positions shown; findings below may reference images not displayed]

FINDINGS: Normal alignment of the lumbar vertebral bodies.  There
is joint space narrowing at L4-L5 and L5-L1 with endplate spurring.
Oblique projection demonstrates no pars fracture.  No subluxation.
IMPRESSION: 1.  No acute findings lumbar spine.
2.  Disc osteophytic disease from L4-S1.

## 2013-10-16 MED ORDER — LISINOPRIL 10 MG PO TABS: ORAL_TABLET | ORAL | Status: DC

## 2013-10-17 ENCOUNTER — Encounter: Payer: Self-pay | Admitting: *Deleted

## 2013-10-17 ENCOUNTER — Ambulatory Visit (INDEPENDENT_AMBULATORY_CARE_PROVIDER_SITE_OTHER): Payer: No Typology Code available for payment source | Admitting: *Deleted

## 2013-10-17 ENCOUNTER — Other Ambulatory Visit: Payer: Self-pay | Admitting: *Deleted

## 2013-10-17 VITALS — BP 134/78 | HR 80 | Temp 98.1°F | Resp 16 | Wt 131.5 lb

## 2013-10-17 DIAGNOSIS — B20 Human immunodeficiency virus [HIV] disease: Secondary | ICD-10-CM

## 2013-10-17 DIAGNOSIS — Z006 Encounter for examination for normal comparison and control in clinical research program: Secondary | ICD-10-CM

## 2013-10-17 DIAGNOSIS — Z0000 Encounter for general adult medical examination without abnormal findings: Secondary | ICD-10-CM

## 2013-10-17 DIAGNOSIS — Z79899 Other long term (current) drug therapy: Secondary | ICD-10-CM

## 2013-10-17 LAB — COMPREHENSIVE METABOLIC PANEL
ALBUMIN: 4.2 g/dL (ref 3.5–5.2)
ALT: 10 U/L (ref 0–35)
AST: 13 U/L (ref 0–37)
Alkaline Phosphatase: 63 U/L (ref 39–117)
BUN: 10 mg/dL (ref 6–23)
CALCIUM: 9.4 mg/dL (ref 8.4–10.5)
CHLORIDE: 106 meq/L (ref 96–112)
CO2: 26 meq/L (ref 19–32)
CREATININE: 0.65 mg/dL (ref 0.50–1.10)
Glucose, Bld: 76 mg/dL (ref 70–99)
POTASSIUM: 4.4 meq/L (ref 3.5–5.3)
Sodium: 138 mEq/L (ref 135–145)
Total Bilirubin: 0.4 mg/dL (ref 0.2–1.2)
Total Protein: 7.5 g/dL (ref 6.0–8.3)

## 2013-10-17 LAB — LIPID PANEL
Cholesterol: 155 mg/dL (ref 0–200)
HDL: 63 mg/dL (ref >39)
LDL CALC: 78 mg/dL (ref 0–99)
Total CHOL/HDL Ratio: 2.5 Ratio
Triglycerides: 70 mg/dL (ref <150)
VLDL: 14 mg/dL (ref 0–40)

## 2013-10-17 LAB — PHOSPHORUS: PHOSPHORUS: 3.8 mg/dL (ref 2.3–4.6)

## 2013-10-17 NOTE — Telephone Encounter (Signed)
Error.  Already ordered.

## 2013-10-17 NOTE — Progress Notes (Signed)
Maley is here for week 37 on A5308 study. She stated she missed a dose of Complera several weeks ago. She has been dealing with "a little depression" and she feels this is typical of someone who is grieving. I mentioned we do have a counselor and this is something that can also be discussed with her physician. She recently lost her husband from cancer. She currently has her cousin (i believe) that has a dx of paranoid schizophrenia living with her. His immediate family members do not want to help him (per her words). He does take his medication and does go to behavioral health once/twice a month but this is an extra stressor in her life. She feels she is okay dealing with her depression right now and feels it will get better with time. She is eating but has noticed a change in wanting to stay at home more. Vital signs are stable and fasting labs were obtained. She received flu vaccine after blood was drawn. Study med was dispensed and she received $50 gift card for visit. Next and final appointment is scheduled for April 03, 2014 @ 9am.   She is scheduled to see Dr. Tommy Medal in October and will need to discuss ARV's and get prescription to be filled when study ends. [name] RN

## 2013-10-17 NOTE — Addendum Note (Signed)
Addended by: Araceli Bouche on: 10/17/2013 10:35 AM   Modules accepted: Orders

## 2013-11-01 ENCOUNTER — Other Ambulatory Visit: Payer: Self-pay | Admitting: Family Medicine

## 2013-11-01 MED ORDER — LISINOPRIL 10 MG PO TABS: ORAL_TABLET | ORAL | Status: DC

## 2013-11-03 ENCOUNTER — Encounter: Payer: Self-pay | Admitting: Infectious Disease

## 2013-11-03 LAB — CD4/CD8 (T-HELPER/T-SUPPRESSOR CELL)
CD4%: 48
CD4: 1200
CD8 T CELL SUPPRESSOR: 47.4
CD8: 1185

## 2013-11-03 LAB — HIV-1 RNA QUANT-NO REFLEX-BLD

## 2013-11-08 ENCOUNTER — Encounter: Payer: Self-pay | Admitting: Infectious Disease

## 2013-11-22 ENCOUNTER — Ambulatory Visit: Payer: Managed Care, Other (non HMO) | Admitting: Infectious Disease

## 2013-11-24 ENCOUNTER — Ambulatory Visit (INDEPENDENT_AMBULATORY_CARE_PROVIDER_SITE_OTHER): Payer: No Typology Code available for payment source | Admitting: Infectious Disease

## 2013-11-24 ENCOUNTER — Encounter: Payer: Self-pay | Admitting: Infectious Disease

## 2013-11-24 VITALS — BP 142/70 | HR 83 | Temp 98.6°F | Wt 128.0 lb

## 2013-11-24 DIAGNOSIS — B20 Human immunodeficiency virus [HIV] disease: Secondary | ICD-10-CM

## 2013-11-24 NOTE — Progress Notes (Signed)
  Subjective:    Patient ID: Connie Wade, female    DOB: December 13, 1955, 58 y.o.   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   Lab Results  Component Value Date   HIV1RNAQUANT <20 02/12/2012   Lab Results  Component Value Date   CD4TABS 1200 10/17/2013   CD4TABS 1540 07/11/2013   CD4TABS 1042 04/18/2013    She has no specific complaints today      Review of Systems  Constitutional: Negative for fever, chills, diaphoresis, activity change, fatigue and unexpected weight change.  HENT: Negative for congestion, rhinorrhea, sinus pressure, sneezing, sore throat and trouble swallowing.   Eyes: Negative for photophobia and visual disturbance.  Respiratory: Negative for cough, chest tightness, shortness of breath, wheezing and stridor.   Cardiovascular: Negative for chest pain, palpitations and leg swelling.  Gastrointestinal: Negative for nausea, vomiting, abdominal pain, diarrhea, constipation, blood in stool, abdominal distention and anal bleeding.  Genitourinary: Negative for dysuria, hematuria, flank pain and difficulty urinating.  Musculoskeletal: Negative for arthralgias, back pain, gait problem and myalgias.  Skin: Negative for color change, pallor, rash and wound.  Neurological: Negative for dizziness, tremors, weakness and light-headedness.  Hematological: Negative for adenopathy. Does not bruise/bleed easily.  Psychiatric/Behavioral: Negative for suicidal ideas, behavioral problems, confusion, sleep disturbance, dysphoric mood, decreased concentration and agitation.       Objective:   Physical Exam  Constitutional: She is oriented to person, place, and time. She appears well-developed and well-nourished. No distress.  HENT:  Head: Normocephalic and atraumatic.  Mouth/Throat: Oropharynx is clear and moist. No oropharyngeal exudate.  Eyes: Conjunctivae and EOM are normal. Pupils  are equal, round, and reactive to light. No scleral icterus.  Neck: Normal range of motion. Neck supple. No JVD present.  Cardiovascular: Normal rate, regular rhythm and normal heart sounds.  Exam reveals no gallop and no friction rub.   No murmur heard. Pulmonary/Chest: Effort normal and breath sounds normal. No respiratory distress. She has no wheezes. She has no rales. She exhibits no tenderness.  Abdominal: She exhibits no distension and no mass. There is no tenderness. There is no rebound and no guarding.  Musculoskeletal: She exhibits no edema and no tenderness.  Lymphadenopathy:    She has no cervical adenopathy.  Neurological: She is alert and oriented to person, place, and time. She exhibits normal muscle tone. Coordination normal.  Skin: Skin is warm and dry. She is not diaphoretic. No erythema. No pallor.  Psychiatric: Her behavior is normal. Judgment and thought content normal.          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 Staying on Complera

## 2014-01-12 ENCOUNTER — Encounter: Payer: Self-pay | Admitting: Infectious Disease

## 2014-04-03 ENCOUNTER — Ambulatory Visit: Payer: Self-pay | Admitting: *Deleted

## 2014-04-03 VITALS — BP 129/82 | HR 75 | Temp 98.0°F | Resp 16 | Wt 125.5 lb

## 2014-04-03 DIAGNOSIS — Z006 Encounter for examination for normal comparison and control in clinical research program: Secondary | ICD-10-CM

## 2014-04-03 LAB — COMPREHENSIVE METABOLIC PANEL
ALT: 13 U/L (ref 0–35)
AST: 16 U/L (ref 0–37)
Albumin: 3.9 g/dL (ref 3.5–5.2)
Alkaline Phosphatase: 63 U/L (ref 39–117)
BUN: 14 mg/dL (ref 6–23)
CALCIUM: 9.1 mg/dL (ref 8.4–10.5)
CHLORIDE: 105 meq/L (ref 96–112)
CO2: 23 mEq/L (ref 19–32)
CREATININE: 0.65 mg/dL (ref 0.50–1.10)
Glucose, Bld: 77 mg/dL (ref 70–99)
Potassium: 4.4 mEq/L (ref 3.5–5.3)
Sodium: 138 mEq/L (ref 135–145)
TOTAL PROTEIN: 7 g/dL (ref 6.0–8.3)
Total Bilirubin: 0.4 mg/dL (ref 0.2–1.2)

## 2014-04-03 LAB — PHOSPHORUS: Phosphorus: 3.6 mg/dL (ref 2.3–4.6)

## 2014-04-03 NOTE — Progress Notes (Signed)
   Subjective:    Patient ID: Connie Wade, female    DOB: [birth year], 59 y.o.   MRN: [id number]  HPI    Review of Systems  Constitutional: Positive for unexpected weight change.  HENT: Negative.   Eyes: Negative.   Respiratory: Negative.   Cardiovascular: Negative.   Gastrointestinal: Negative.   Genitourinary: Negative.   Musculoskeletal: Positive for back pain.  Skin: Negative.   Neurological: Negative.   Psychiatric/Behavioral: Negative.        Objective:   Physical Exam  Constitutional: She is oriented to person, place, and time.  HENT:  Mouth/Throat: Oropharynx is clear and moist.  Eyes: No scleral icterus.  Cardiovascular: Normal rate, regular rhythm, normal heart sounds and intact distal pulses.   Pulmonary/Chest: Effort normal and breath sounds normal.  Abdominal: Soft. Bowel sounds are normal.  Musculoskeletal: Normal range of motion. She exhibits no edema.  Lymphadenopathy:    She has no cervical adenopathy.  Neurological: She is alert and oriented to person, place, and time.  Skin: Skin is warm and dry.  Psychiatric: She has a normal mood and affect.          Assessment & Plan:

## 2014-04-03 NOTE — Progress Notes (Signed)
Connie Wade is here today for her final study visit for [id number]. She plans to continue on complera after study. She says that she is retiring from work this Friday and will have a lot less stress on her now that her family member has moved out of the house. She continues to have hot flashes and night sweats and backache and stiffness. She continues to lose weight unintentionally and says she doesn't have any appetite unless she smokes pot. She has an appointment with Dr. Tommy Medal in April for followup. She screened today for A5321, the long term observational study and will have labs done every 6 months for several years. Informed consent was obtained after she had time to review the consent and we discussed it in detail. She said she understood the protocol and knew she can withdraw consent at any time .

## 2014-04-04 LAB — HEPATITIS B SURFACE ANTIBODY,QUALITATIVE: Hep B S Ab: POSITIVE — AB

## 2014-04-04 LAB — HEPATITIS B SURFACE ANTIGEN: Hepatitis B Surface Ag: NEGATIVE

## 2014-04-04 LAB — HEPATITIS C ANTIBODY: HCV Ab: NEGATIVE

## 2014-04-05 ENCOUNTER — Other Ambulatory Visit: Payer: Self-pay | Admitting: *Deleted

## 2014-04-05 DIAGNOSIS — Z113 Encounter for screening for infections with a predominantly sexual mode of transmission: Secondary | ICD-10-CM

## 2014-04-25 ENCOUNTER — Encounter: Payer: Self-pay | Admitting: Infectious Disease

## 2014-04-25 LAB — CD4/CD8 (T-HELPER/T-SUPPRESSOR CELL)
CD4%: 46.3
CD4: 1158
CD8 % Suppressor T Cell: 45.7
CD8: 1143

## 2014-04-25 LAB — HIV-1 RNA QUANT-NO REFLEX-BLD: HIV-1 RNA Viral Load: <40

## 2014-05-08 ENCOUNTER — Ambulatory Visit (INDEPENDENT_AMBULATORY_CARE_PROVIDER_SITE_OTHER): Payer: Self-pay | Admitting: *Deleted

## 2014-05-08 VITALS — BP 128/80 | HR 80 | Temp 97.8°F | Resp 16 | Wt 123.2 lb

## 2014-05-08 DIAGNOSIS — Z006 Encounter for examination for normal comparison and control in clinical research program: Secondary | ICD-10-CM

## 2014-05-08 LAB — CD4/CD8 (T-HELPER/T-SUPPRESSOR CELL)
CD4%: 51.6
CD4: 1084
CD8 T CELL SUPPRESSOR: 41.2
CD8: 865

## 2014-05-08 LAB — COMPREHENSIVE METABOLIC PANEL
ALK PHOS: 64 U/L (ref 39–117)
ALT: 13 U/L (ref 0–35)
AST: 16 U/L (ref 0–37)
Albumin: 3.7 g/dL (ref 3.5–5.2)
BUN: 11 mg/dL (ref 6–23)
CO2: 26 mEq/L (ref 19–32)
Calcium: 8.8 mg/dL (ref 8.4–10.5)
Chloride: 107 mEq/L (ref 96–112)
Creat: 0.67 mg/dL (ref 0.50–1.10)
Glucose, Bld: 78 mg/dL (ref 70–99)
POTASSIUM: 4.2 meq/L (ref 3.5–5.3)
SODIUM: 138 meq/L (ref 135–145)
TOTAL PROTEIN: 6.7 g/dL (ref 6.0–8.3)
Total Bilirubin: 0.4 mg/dL (ref 0.2–1.2)

## 2014-05-08 LAB — HIV-1 RNA QUANT-NO REFLEX-BLD

## 2014-05-08 NOTE — Progress Notes (Signed)
Connie Wade is here for A5321, entry visit. Reviewed eligibility checklist and confirmed her willingness to enter study. We reviewed the study analysis participant letter and I answered any questions she had. She initialed the letter and I gave her a copy. Assessment unchanged since last screening visit. Fasting labs were obtained (stuck x3), hair sample obtained, and vital signs stable. Questionnaires was completed. She received $50 gift card for study and next appointment scheduled for October 30, 2014 @ 9am. [name] RN

## 2014-05-11 ENCOUNTER — Encounter: Payer: Self-pay | Admitting: Infectious Disease

## 2014-05-21 ENCOUNTER — Ambulatory Visit: Payer: No Typology Code available for payment source | Admitting: Infectious Disease

## 2014-05-21 ENCOUNTER — Encounter: Payer: Self-pay | Admitting: Family Medicine

## 2014-05-21 NOTE — Progress Notes (Signed)
Will forward to MD.  Patient has been established with ID for years and recently switched to an insurance that requires referral from PCP. Mashayla Lavin,CMA

## 2014-05-21 NOTE — Progress Notes (Unsigned)
Patient is needing a referral to Infectious Disease.  She has [employer].  She had an appointment with them today,but they wouldn't see her without a referral.  They rescheduled her for May 2.  You didn't have any opening until June, so she is scheduled to see you June 9th.

## 2014-05-22 ENCOUNTER — Other Ambulatory Visit: Payer: Self-pay | Admitting: Family Medicine

## 2014-05-22 DIAGNOSIS — B20 Human immunodeficiency virus [HIV] disease: Secondary | ICD-10-CM

## 2014-05-29 ENCOUNTER — Ambulatory Visit: Payer: Self-pay | Admitting: Infectious Disease

## 2014-06-13 ENCOUNTER — Ambulatory Visit: Payer: Self-pay | Admitting: Infectious Disease

## 2014-07-05 ENCOUNTER — Encounter: Payer: Self-pay | Admitting: Family Medicine

## 2014-10-30 ENCOUNTER — Encounter: Payer: Self-pay | Admitting: *Deleted

## 2014-10-30 ENCOUNTER — Encounter (INDEPENDENT_AMBULATORY_CARE_PROVIDER_SITE_OTHER): Payer: Self-pay | Admitting: *Deleted

## 2014-10-30 VITALS — BP 138/87 | HR 83 | Temp 98.8°F | Resp 16 | Wt 117.0 lb

## 2014-10-30 DIAGNOSIS — Z006 Encounter for examination for normal comparison and control in clinical research program: Secondary | ICD-10-CM

## 2014-10-30 LAB — COMPREHENSIVE METABOLIC PANEL
ALBUMIN: 4 g/dL (ref 3.6–5.1)
ALK PHOS: 70 U/L (ref 33–130)
ALT: 16 U/L (ref 6–29)
AST: 21 U/L (ref 10–35)
BUN: 9 mg/dL (ref 7–25)
CO2: 25 mmol/L (ref 20–31)
Calcium: 9 mg/dL (ref 8.6–10.4)
Chloride: 104 mmol/L (ref 98–110)
Creat: 0.68 mg/dL (ref 0.50–0.99)
Glucose, Bld: 80 mg/dL (ref 65–99)
POTASSIUM: 4.3 mmol/L (ref 3.5–5.3)
Sodium: 136 mmol/L (ref 135–146)
TOTAL PROTEIN: 7.3 g/dL (ref 6.1–8.1)
Total Bilirubin: 0.3 mg/dL (ref 0.2–1.2)

## 2014-10-30 NOTE — Progress Notes (Signed)
Connie Wade is here for A5321, week 24. She c/o night sweats and body aches. Upon further questioning she mentioned she had not been adherent to her regimen. She was taking Complera up until May 2016, would take them 4-5 days per week, then July 2016 completely stopped up until Oct 2nd when she started to take them again. Her explaination for this is that she could only take them in the morning because if she took them later then they would cause her to have night sweats. So she took her Complera when she got up but would only take them if she ate because she knew she must take them with food. Because of her hot flashes and it being the summer she completely stopped taking them at this time because she did not want to make the hot flashes worse. She feels that the new onset of body aches is due to the flu but I mentioned that it is probably more associated with HIV and the fact that she has not been adherent. She has an unintentional weight loss of 5 lbs since April. 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 Since she has missed >21 days of ARVs and because of this reason she has been taken off study. Today's visit will be her premature discontinuation visit. Vitals completed, blood drawn. She declined a flu vaccine today. I mentioned that if her VL is elevated then we will have her come back in to confirm it and check for resistance. She received $50 gift card and no further study appointments have been made. She does not have insurance so I had her see [facility] for RW/ADAP services. She also has an appointment with Dr. Tommy Medal in November to address the above.  [name] RN

## 2014-11-14 ENCOUNTER — Encounter: Payer: Self-pay | Admitting: Infectious Disease

## 2014-11-14 LAB — HIV-1 RNA QUANT-NO REFLEX-BLD: HIV-1 RNA Viral Load: <40

## 2014-11-14 LAB — CD4/CD8 (T-HELPER/T-SUPPRESSOR CELL)
CD4%: 55.7
CD4: 724
CD8 % Suppressor T Cell: 42.5
CD8: 553

## 2014-12-04 ENCOUNTER — Encounter (HOSPITAL_COMMUNITY): Payer: Self-pay | Admitting: Emergency Medicine

## 2014-12-04 ENCOUNTER — Encounter (HOSPITAL_COMMUNITY): Payer: Self-pay | Admitting: Anesthesiology

## 2014-12-04 ENCOUNTER — Inpatient Hospital Stay (HOSPITAL_COMMUNITY)
Admission: EM | Admit: 2014-12-04 | Discharge: 2014-12-14 | DRG: 020 | Disposition: A | Discharge status: Home / Self Care | Payer: Self-pay | Attending: Neurosurgery | Admitting: Neurosurgery

## 2014-12-04 ENCOUNTER — Inpatient Hospital Stay (HOSPITAL_COMMUNITY): Payer: Self-pay

## 2014-12-04 ENCOUNTER — Emergency Department (HOSPITAL_COMMUNITY): Payer: Self-pay

## 2014-12-04 DIAGNOSIS — B20 Human immunodeficiency virus [HIV] disease: Secondary | ICD-10-CM | POA: Diagnosis present

## 2014-12-04 DIAGNOSIS — I608 Other nontraumatic subarachnoid hemorrhage: Secondary | ICD-10-CM | POA: Diagnosis present

## 2014-12-04 DIAGNOSIS — R519 Headache, unspecified: Secondary | ICD-10-CM | POA: Insufficient documentation

## 2014-12-04 DIAGNOSIS — Z452 Encounter for adjustment and management of vascular access device: Secondary | ICD-10-CM | POA: Insufficient documentation

## 2014-12-04 DIAGNOSIS — D72829 Elevated white blood cell count, unspecified: Secondary | ICD-10-CM | POA: Diagnosis present

## 2014-12-04 DIAGNOSIS — E876 Hypokalemia: Secondary | ICD-10-CM | POA: Diagnosis present

## 2014-12-04 DIAGNOSIS — B1910 Unspecified viral hepatitis B without hepatic coma: Secondary | ICD-10-CM | POA: Diagnosis present

## 2014-12-04 DIAGNOSIS — I607 Nontraumatic subarachnoid hemorrhage from unspecified intracranial artery: Secondary | ICD-10-CM

## 2014-12-04 DIAGNOSIS — F329 Major depressive disorder, single episode, unspecified: Secondary | ICD-10-CM | POA: Diagnosis present

## 2014-12-04 DIAGNOSIS — R51 Headache: Secondary | ICD-10-CM

## 2014-12-04 DIAGNOSIS — I6030 Nontraumatic subarachnoid hemorrhage from unspecified posterior communicating artery: Principal | ICD-10-CM | POA: Diagnosis present

## 2014-12-04 DIAGNOSIS — I606 Nontraumatic subarachnoid hemorrhage from other intracranial arteries: Secondary | ICD-10-CM | POA: Insufficient documentation

## 2014-12-04 DIAGNOSIS — I739 Peripheral vascular disease, unspecified: Secondary | ICD-10-CM | POA: Diagnosis present

## 2014-12-04 DIAGNOSIS — Z79899 Other long term (current) drug therapy: Secondary | ICD-10-CM

## 2014-12-04 DIAGNOSIS — D649 Anemia, unspecified: Secondary | ICD-10-CM | POA: Diagnosis present

## 2014-12-04 DIAGNOSIS — E785 Hyperlipidemia, unspecified: Secondary | ICD-10-CM | POA: Diagnosis present

## 2014-12-04 DIAGNOSIS — I609 Nontraumatic subarachnoid hemorrhage, unspecified: Secondary | ICD-10-CM

## 2014-12-04 DIAGNOSIS — F17210 Nicotine dependence, cigarettes, uncomplicated: Secondary | ICD-10-CM | POA: Diagnosis present

## 2014-12-04 DIAGNOSIS — I10 Essential (primary) hypertension: Secondary | ICD-10-CM | POA: Diagnosis present

## 2014-12-04 LAB — I-STAT CHEM 8, ED
BUN: 5 mg/dL — ABNORMAL LOW (ref 6–20)
CREATININE: 0.6 mg/dL (ref 0.44–1.00)
Calcium, Ion: 1.14 mmol/L (ref 1.13–1.30)
Chloride: 106 mmol/L (ref 101–111)
Glucose, Bld: 80 mg/dL (ref 65–99)
HEMATOCRIT: 41 % (ref 36.0–46.0)
HEMOGLOBIN: 13.9 g/dL (ref 12.0–15.0)
POTASSIUM: 3.4 mmol/L — AB (ref 3.5–5.1)
SODIUM: 142 mmol/L (ref 135–145)
TCO2: 24 mmol/L (ref 0–100)

## 2014-12-04 LAB — MRSA PCR SCREENING: MRSA by PCR: NEGATIVE

## 2014-12-04 LAB — CBC
HCT: 34.8 % — ABNORMAL LOW (ref 36.0–46.0)
HEMOGLOBIN: 11.7 g/dL — AB (ref 12.0–15.0)
MCH: 24.9 pg — AB (ref 26.0–34.0)
MCHC: 33.6 g/dL (ref 30.0–36.0)
MCV: 74 fL — AB (ref 78.0–100.0)
PLATELETS: 422 10*3/uL — AB (ref 150–400)
RBC: 4.7 MIL/uL (ref 3.87–5.11)
RDW: 17 % — ABNORMAL HIGH (ref 11.5–15.5)
WBC: 7 10*3/uL (ref 4.0–10.5)

## 2014-12-04 MED ORDER — ALBUTEROL SULFATE (2.5 MG/3ML) 0.083% IN NEBU: 2.5000 mg | INHALATION_SOLUTION | Freq: Four times a day (QID) | RESPIRATORY_TRACT | Status: DC | PRN

## 2014-12-04 MED ORDER — NICARDIPINE HCL IN NACL 20-0.86 MG/200ML-% IV SOLN
3.0000 mg/h | Freq: Once | INTRAVENOUS | Status: AC
  Administered 2014-12-04: 5 mg/h via INTRAVENOUS
  Filled 2014-12-04: qty 200

## 2014-12-04 MED ORDER — NICARDIPINE HCL IN NACL 20-0.86 MG/200ML-% IV SOLN
INTRAVENOUS | Status: AC
Start: 2014-12-04 — End: 2014-12-04
  Administered 2014-12-04: 12.5 mg
  Filled 2014-12-04: qty 200

## 2014-12-04 MED ORDER — EMTRICITAB-RILPIVIR-TENOFOV DF 200-25-300 MG PO TABS: 1.0000 | ORAL_TABLET | Freq: Every day | ORAL | Status: DC

## 2014-12-04 MED ORDER — DIPHENHYDRAMINE HCL 50 MG/ML IJ SOLN
25.0000 mg | Freq: Once | INTRAMUSCULAR | Status: AC
  Administered 2014-12-04: 25 mg via INTRAVENOUS
  Filled 2014-12-04: qty 1

## 2014-12-04 MED ORDER — IOHEXOL 350 MG/ML SOLN
100.0000 mL | Freq: Once | INTRAVENOUS | Status: AC | PRN
  Administered 2014-12-04: 100 mL via INTRAVENOUS

## 2014-12-04 MED ORDER — STROKE: EARLY STAGES OF RECOVERY BOOK
Freq: Once | Status: AC
  Administered 2014-12-04: 21:00:00
  Filled 2014-12-04: qty 1

## 2014-12-04 MED ORDER — LABETALOL HCL 5 MG/ML IV SOLN
10.0000 mg | INTRAVENOUS | Status: DC | PRN
  Administered 2014-12-05 – 2014-12-06 (×2): 20 mg via INTRAVENOUS
  Administered 2014-12-06 – 2014-12-07 (×4): 40 mg via INTRAVENOUS
  Administered 2014-12-08 (×3): 10 mg via INTRAVENOUS
  Filled 2014-12-04: qty 8
  Filled 2014-12-04 (×3): qty 4
  Filled 2014-12-04: qty 8
  Filled 2014-12-04: qty 4
  Filled 2014-12-04: qty 8
  Filled 2014-12-04: qty 4
  Filled 2014-12-04: qty 8

## 2014-12-04 MED ORDER — POTASSIUM CHLORIDE CRYS ER 20 MEQ PO TBCR
40.0000 meq | EXTENDED_RELEASE_TABLET | Freq: Once | ORAL | Status: AC
  Administered 2014-12-04: 40 meq via ORAL
  Filled 2014-12-04: qty 2

## 2014-12-04 MED ORDER — ACETAMINOPHEN 650 MG RE SUPP: 650.0000 mg | RECTAL | Status: DC | PRN

## 2014-12-04 MED ORDER — EMTRICITABINE-TENOFOVIR AF 200-25 MG PO TABS
1.0000 | ORAL_TABLET | Freq: Every day | ORAL | Status: DC
  Filled 2014-12-04 (×2): qty 1

## 2014-12-04 MED ORDER — PROCHLORPERAZINE EDISYLATE 5 MG/ML IJ SOLN
10.0000 mg | Freq: Once | INTRAMUSCULAR | Status: AC
  Administered 2014-12-04: 10 mg via INTRAVENOUS
  Filled 2014-12-04: qty 2

## 2014-12-04 MED ORDER — MANNITOL 25 % IV SOLN: 25.0000 g | Freq: Four times a day (QID) | INTRAVENOUS | Status: DC

## 2014-12-04 MED ORDER — DEXAMETHASONE SODIUM PHOSPHATE 4 MG/ML IJ SOLN
4.0000 mg | Freq: Four times a day (QID) | INTRAMUSCULAR | Status: DC
  Administered 2014-12-04 – 2014-12-14 (×39): 4 mg via INTRAVENOUS
  Filled 2014-12-04 (×39): qty 1

## 2014-12-04 MED ORDER — RILPIVIRINE HCL 25 MG PO TABS
25.0000 mg | ORAL_TABLET | Freq: Every day | ORAL | Status: DC
  Filled 2014-12-04 (×2): qty 1

## 2014-12-04 MED ORDER — ACETAMINOPHEN 325 MG PO TABS
650.0000 mg | ORAL_TABLET | ORAL | Status: DC | PRN
  Administered 2014-12-13: 650 mg via ORAL
  Filled 2014-12-04 (×2): qty 2

## 2014-12-04 MED ORDER — PANTOPRAZOLE SODIUM 40 MG IV SOLR
40.0000 mg | Freq: Every day | INTRAVENOUS | Status: DC
  Administered 2014-12-04: 40 mg via INTRAVENOUS
  Filled 2014-12-04: qty 40

## 2014-12-04 MED ORDER — SENNOSIDES-DOCUSATE SODIUM 8.6-50 MG PO TABS
1.0000 | ORAL_TABLET | Freq: Two times a day (BID) | ORAL | Status: DC
  Administered 2014-12-04 – 2014-12-14 (×16): 1 via ORAL
  Filled 2014-12-04 (×17): qty 1

## 2014-12-04 MED ORDER — LISINOPRIL 10 MG PO TABS
10.0000 mg | ORAL_TABLET | Freq: Every day | ORAL | Status: DC
Start: 2014-12-04 — End: 2014-12-14
  Administered 2014-12-04 – 2014-12-14 (×11): 10 mg via ORAL
  Filled 2014-12-04 (×11): qty 1

## 2014-12-04 MED ORDER — FENTANYL CITRATE (PF) 100 MCG/2ML IJ SOLN
25.0000 ug | INTRAMUSCULAR | Status: DC | PRN
  Administered 2014-12-04 – 2014-12-05 (×5): 25 ug via INTRAVENOUS
  Administered 2014-12-05 – 2014-12-09 (×4): 50 ug via INTRAVENOUS
  Filled 2014-12-04 (×9): qty 2

## 2014-12-04 MED ORDER — NICARDIPINE HCL IN NACL 20-0.86 MG/200ML-% IV SOLN
3.0000 mg/h | INTRAVENOUS | Status: DC
  Administered 2014-12-04: 10 mg/h via INTRAVENOUS
  Administered 2014-12-04: 7.5 mg/h via INTRAVENOUS
  Filled 2014-12-04 (×3): qty 200

## 2014-12-04 MED ORDER — KETOROLAC TROMETHAMINE 30 MG/ML IJ SOLN
30.0000 mg | Freq: Once | INTRAMUSCULAR | Status: AC
  Administered 2014-12-04: 30 mg via INTRAVENOUS
  Filled 2014-12-04: qty 1

## 2014-12-04 MED ORDER — ALBUMIN HUMAN 25 % IV SOLN
25.0000 g | Freq: Four times a day (QID) | INTRAVENOUS | Status: DC
  Administered 2014-12-04 – 2014-12-05 (×3): 25 g via INTRAVENOUS
  Filled 2014-12-04 (×8): qty 100

## 2014-12-04 MED ORDER — SODIUM CHLORIDE 0.9 % IV BOLUS (SEPSIS)
1000.0000 mL | Freq: Once | INTRAVENOUS | Status: AC
  Administered 2014-12-04: 1000 mL via INTRAVENOUS

## 2014-12-04 MED ORDER — ONDANSETRON HCL 4 MG/2ML IJ SOLN: 4.0000 mg | Freq: Three times a day (TID) | INTRAMUSCULAR | Status: AC | PRN

## 2014-12-04 NOTE — Procedures (Signed)
Central Venous Catheter Insertion Procedure Note Connie Wade [id number] 03-12-1955  Procedure: Insertion of Central Venous Catheter Indications: Assessment of intravascular volume, Drug and/or fluid administration and Frequent blood sampling  Procedure Details Consent: Risks of procedure as well as the alternatives and risks of each were explained to the (patient/caregiver).  Consent for procedure obtained. Time Out: Verified patient identification, verified procedure, site/side was marked, verified correct patient position, special equipment/implants available, medications/allergies/relevent history reviewed, required imaging and test results available.  Performed  Maximum sterile technique was used including antiseptics, cap, gloves, gown, hand hygiene, mask and sheet. Skin prep: Chlorhexidine; local anesthetic administered A antimicrobial bonded/coated triple lumen catheter was placed in the left internal jugular vein using the Seldinger technique.  Evaluation Blood flow good Complications: No apparent complications Patient did tolerate procedure well. Chest X-ray ordered to verify placement.  CXR: pending.  Procedure performed under direct ultrasound guidance for real time vessel cannulation.      Montey Hora, [facility] Pager: [phone]  or [phone] 12/04/2014, 9:13 PM

## 2014-12-04 NOTE — ED Provider Notes (Signed)
CSN: [id number]     Arrival date & time 12/04/14  1400 History   First MD Initiated Contact with Patient 12/04/14 1411     Chief Complaint  Patient presents with  . Headache  . Emesis     (Consider location/radiation/quality/duration/timing/severity/associated sxs/prior Treatment) Patient is a 59 y.o. female presenting with headaches and vomiting. The history is provided by the patient.  Headache Pain location:  Frontal Quality:  Sharp Radiates to:  Does not radiate Severity currently:  10/10 Severity at highest:  10/10 Onset quality:  Sudden Duration:  1 hour Timing:  Constant Progression:  Worsening Chronicity:  New Similar to prior headaches: no   Context: activity and bright light   Relieved by:  Nothing Worsened by:  Nothing Ineffective treatments:  None tried Associated symptoms: photophobia and vomiting   Associated symptoms: no congestion, no dizziness, no fever, no myalgias and no nausea   Emesis Associated symptoms: no arthralgias, no chills, no headaches and no myalgias     59 yo F with a chief complaint of a sudden onset headache. States that it occurred about 20 minutes ago was sudden severe and progressively worsening from there. Patient states that she ran upstairs to go bathroom as she walked back down she had onset of headache. Worse headache of her life. Denies radiation to the neck. Denies upper or lower extreme weakness. Denies difficulty with speech or coordination. Has had some vomiting with this as well. Denies fevers chills head injury. Worse with bright lights.  Past Medical History  Diagnosis Date  . HIV 03/25/2006    since 2000  . HEPATITIS B 03/25/2006  . HYPERLIPIDEMIA 03/25/2006  . UTERINE FIBROID 03/25/2006  . ANEMIA, OTHER, UNSPECIFIED 03/25/2006  . TOBACCO DEPENDENCE 03/25/2006  . DEPRESSION 03/17/2007  . HYPERTENSION, BENIGN ESSENTIAL 04/26/2007  . HYDRADENITIS 03/25/2006  . Tubular adenoma of colon 06/23/2011    Benign, no high grade dysplasia  06/16/11 Followed by [name] Dr. Watt Climes Repeat in 5 years (due 2018)   Past Surgical History  Procedure Laterality Date  . Ovarian cyst removal  2001   Family History  Problem Relation Age of Onset  . COPD Sister   . Arthritis Mother   . Alcohol abuse Father    Social History  Substance Use Topics  . Smoking status: Current Every Day Smoker -- 1.00 packs/day    Types: Cigarettes  . Smokeless tobacco: None  . Alcohol Use: 0.6 oz/week    1 Glasses of wine per week     Comment: everday   OB History    No data available     Review of Systems  Constitutional: Negative for fever and chills.  HENT: Negative for congestion and rhinorrhea.   Eyes: Positive for photophobia. Negative for redness and visual disturbance.  Respiratory: Negative for shortness of breath and wheezing.   Cardiovascular: Negative for chest pain and palpitations.  Gastrointestinal: Positive for vomiting. Negative for nausea.  Genitourinary: Negative for dysuria and urgency.  Musculoskeletal: Negative for myalgias and arthralgias.  Skin: Negative for pallor and wound.  Neurological: Negative for dizziness and headaches.      Allergies  Review of patient's allergies indicates no known allergies.  Home Medications   Prior to Admission medications   Medication Sig Start Date End Date Taking? Authorizing Provider  acetaminophen (ACETAMINOPHEN EXTRA STRENGTH) 500 MG tablet Take 1 tablet (500 mg total) by mouth every 6 (six) hours as needed. Patient taking differently: Take 500 mg by mouth every 6 (six) hours  as needed for mild pain, moderate pain or headache.  03/07/13  Yes Kandis Nab, MD  albuterol (PROVENTIL HFA;VENTOLIN HFA) 108 (90 BASE) MCG/ACT inhaler Inhale 2 puffs into the lungs every 6 (six) hours as needed for wheezing. 04/27/12  Yes Cletus Gash, MD  Emtricitab-Rilpivir-Tenofovir 200-25-300 MG TABS Take 1 tablet by mouth daily. 02/09/13  Yes Truman Hayward, MD  lisinopril  (PRINIVIL,ZESTRIL) 10 MG tablet TAKE 1 TABLET (10 MG TOTAL) BY MOUTH DAILY. Patient not taking: Reported on 12/04/2014 11/01/13   Olam Idler, MD   BP 181/89 mmHg  Pulse 76  Temp(Src) 98.1 F (36.7 C) (Oral)  Resp 18  SpO2 100% Physical Exam  Constitutional: She is oriented to person, place, and time. She appears well-developed and well-nourished. No distress.  HENT:  Head: Normocephalic and atraumatic.  Eyes: EOM are normal. Pupils are equal, round, and reactive to light.  Neck: Normal range of motion. Neck supple.  Cardiovascular: Normal rate and regular rhythm.  Exam reveals no gallop and no friction rub.   No murmur heard. Pulmonary/Chest: Effort normal. She has no wheezes. She has no rales.  Abdominal: Soft. She exhibits no distension. There is no tenderness. There is no rebound and no guarding.  Musculoskeletal: She exhibits no edema or tenderness.  Neurological: She is alert and oriented to person, place, and time. She has normal strength. No cranial nerve deficit or sensory deficit. GCS eye subscore is 4. GCS verbal subscore is 5. GCS motor subscore is 6.  Reflex Scores:      Tricep reflexes are 2+ on the right side and 2+ on the left side.      Bicep reflexes are 2+ on the right side and 2+ on the left side.      Brachioradialis reflexes are 2+ on the right side and 2+ on the left side.      Patellar reflexes are 2+ on the right side and 2+ on the left side.      Achilles reflexes are 2+ on the right side and 2+ on the left side. Skin: Skin is warm and dry. She is not diaphoretic.  Psychiatric: She has a normal mood and affect. Her behavior is normal.  Nursing note and vitals reviewed.   ED Course  Procedures (including critical care time) Labs Review Labs Reviewed  I-STAT CHEM 8, ED - Abnormal; Notable for the following:    Potassium 3.4 (*)    BUN 5 (*)    All other components within normal limits    Imaging Review Ct Angio Head W/cm &/or Wo Cm  12/04/2014   CLINICAL DATA:  Worst headache of life. Headache began today. Vomiting. EXAM: CT ANGIOGRAPHY HEAD TECHNIQUE: Multidetector CT imaging of the head was performed using the standard protocol during bolus administration of intravenous contrast. Multiplanar CT image reconstructions and MIPs were obtained to evaluate the vascular anatomy. CONTRAST:  134mL OMNIPAQUE IOHEXOL 350 MG/ML SOLN COMPARISON:  None. FINDINGS: CT HEAD Brain: Mild the subarachnoid hemorrhage. There is subtle hyperdensity anterior to the pons on the right extending into the suprasellar cistern. No other significant hemorrhage. Ventricles may be slightly dilated with mild prominence of the temporal horns. No shift of midline structures. No acute infarct or mass. Calvarium and skull base: Negative Paranasal sinuses: Mild mucosal edema in the paranasal sinuses Orbits: Negative CTA HEAD Anterior circulation: Atherosclerotic calcification in the right cavernous carotid without significant stenosis. Aneurysm of the right posterior communicating artery measures 8.6 x 6.4 mm. Relatively wide  neck of the aneurysm. The neck measures approximately 4.5 mm in diameter. Aneurysm enhances homogeneously without extravasation. Right anterior and middle cerebral arteries are patent bilaterally without significant stenosis. Atherosclerotic calcification left cavernous carotid without significant stenosis. Left anterior and middle cerebral arteries patent bilaterally without significant stenosis. Posterior circulation: Both vertebral arteries patent to the basilar. PICA patent bilaterally. Basilar patent. Superior cerebellar and posterior cerebral arteries patent bilaterally. No aneurysm in the posterior circulation. Venous sinuses: Patent Anatomic variants: None Delayed phase:Postcontrast imaging reveals mild enhancement of the subarachnoid space around the aneurysm on the right extending into the sylvian fissure compatible with subarachnoid hemorrhage enhancement. No  enhancing mass lesion. IMPRESSION: Subarachnoid hemorrhage. There is a subtle hemorrhage in the pre pontine cistern on the right and in the right suprasellar cistern. 8.6 x 6.4 mm right posterior communicating artery aneurysm. Aneurysmal pattern of hemorrhage due to posterior communicating artery aneurysm. No other aneurysm. Critical Value/emergent results were called by telephone at the time of interpretation on 12/04/2014 at 5:14 pm to Dr. Theotis Burrow , who verbally acknowledged these results. Electronically Signed   By: Franchot Gallo M.D.   On: 12/04/2014 17:16   I have personally reviewed and evaluated these images and lab results as part of my medical decision-making.   EKG Interpretation None      MDM   Final diagnoses:  Sudden onset of severe headache  Ruptured cerebral aneurysm not due to trauma [facility])    59 yo F HIV positive with a sudden onset headache. Concern for possible subarachnoid with acute onset will obtain a CT scan with of the head, migraine cocktail.  As scan is within 4 hours feel no need for post CT LP if negative.   Patient reassessed post migraine cocktail and feeling much better.   Patient care turned over to Dr. Rex Kras, please see her note for further details.   The patients results and plan were reviewed and discussed.   Any x-rays performed were independently reviewed by myself.   Differential diagnosis were considered with the presenting HPI.  Medications  nicardipine (CARDENE) 20mg  in 0.86% saline 271ml IV infusion (0.1 mg/ml) (not administered)  prochlorperazine (COMPAZINE) injection 10 mg (10 mg Intravenous Given 12/04/14 1452)  diphenhydrAMINE (BENADRYL) injection 25 mg (25 mg Intravenous Given 12/04/14 1452)  sodium chloride 0.9 % bolus 1,000 mL (1,000 mLs Intravenous New Bag/Given 12/04/14 1452)  ketorolac (TORADOL) 30 MG/ML injection 30 mg (30 mg Intravenous Given 12/04/14 1452)  iohexol (OMNIPAQUE) 350 MG/ML injection 100 mL (100 mLs Intravenous  Contrast Given 12/04/14 1634)    Filed Vitals:   12/04/14 1406 12/04/14 1728  BP: 166/116 181/89  Pulse: 71 76  Temp: 98.1 F (36.7 C)   TempSrc: Oral   Resp: 20 18  SpO2: 99% 100%    Final diagnoses:  Sudden onset of severe headache  Ruptured cerebral aneurysm not due to trauma [facility])      Deno Etienne, DO 12/04/14 1741

## 2014-12-04 NOTE — ED Provider Notes (Signed)
I received this patient in signout from Dr. Tyrone Nine. Per his report, she presented with sudden onset of headache after running down the stairs and had associated vomiting. She was awaiting CTA to evaluate for [facility]. I was contacted by the radiologist who noted a right posterior communicating artery aneurysm with surrounding subarachnoid hemorrhage. No midline shift. On reexamination, the patient remains neurologically intact. She was hypertensive with BP 360 systolic. I spoke with the neurosurgeon on call, Dr. Cyndy Freeze, and I appreciate his assistance. Per his recs, I have initiated a nicardipine drip and will transfer patient to ICU at [facility] for management. I have discussed the findings and management plan with the patient, who has voiced understanding and is in agreement.  CRITICAL CARE Performed by: Wenda Overland Brenden Rudman   Total critical care time: 30 minutes  Critical care time was exclusive of separately billable procedures and treating other patients.  Critical care was necessary to treat or prevent imminent or life-threatening deterioration.  Critical care was time spent personally by me on the following activities: development of treatment plan with patient and/or surrogate as well as nursing, discussions with consultants, evaluation of patient's response to treatment, examination of patient, obtaining history from patient or surrogate, ordering and performing treatments and interventions, ordering and review of laboratory studies, ordering and review of radiographic studies, pulse oximetry and re-evaluation of patient's condition.   Sharlett Iles, MD 12/04/14 [phone]

## 2014-12-04 NOTE — ED Notes (Signed)
Patient transported to CT 

## 2014-12-04 NOTE — ED Notes (Signed)
Pt c/o headache that started little while ago.  Pt actively vomiting in triage.

## 2014-12-04 NOTE — ED Notes (Signed)
[facility] Took pt.

## 2014-12-04 NOTE — Consult Note (Signed)
PULMONARY / CRITICAL CARE MEDICINE   Name: Connie Wade MRN: [id number] DOB: 10-Dec-1955    ADMISSION DATE:  12/04/2014 CONSULTATION DATE:  12/04/14  REFERRING MD :  Ditty  CHIEF COMPLAINT:  Headache  INITIAL PRESENTATION:  59 y.o. F presented to [facility] ED 11/8 with worse headache of her life.  Found to have [facility] for which she was transferred to [facility] for neurosurgical consultation.  PCCM called for assistance with medical management and for placement of CVL.  She will likely undergo surgical intervention 11/9 with Dr. Kathyrn Sheriff..    STUDIES:  CTA head 11/8 >>> SAH with 8.6 x 6.66mm right PCOM artery aneurysm.  SIGNIFICANT EVENTS: 11/8 - admitted with [facility].   HISTORY OF PRESENT ILLNESS:  Pt is encephalopathic; therefore, this HPI is obtained from chart review. Connie Wade is a 59 y.o. female with a PMH as outlined below. She presented to [facility] ED 11/8 with the worst headache of her life associated with nausea and vomiting.  She was found to have a SAH from a ruptured PCOM aneurysm.  She was transferred to [facility] for neurosurgical consultation.  She was seen by Dr. Cyndy Freeze who recommended surgical intervention.  She is to undergo surgery on 12/05/14 by Dr. Kathyrn Sheriff.  PCCM was consulted for assistance with medical management and for placement of CVL for mannitol infusion.   PAST MEDICAL HISTORY :   has a past medical history of HIV (03/25/2006); HEPATITIS B (03/25/2006); HYPERLIPIDEMIA (03/25/2006); UTERINE FIBROID (03/25/2006); ANEMIA, OTHER, UNSPECIFIED (03/25/2006); TOBACCO DEPENDENCE (03/25/2006); DEPRESSION (03/17/2007); HYPERTENSION, BENIGN ESSENTIAL (04/26/2007); HYDRADENITIS (03/25/2006); and Tubular adenoma of colon (06/23/2011).  has past surgical history that includes Ovarian cyst removal (2001). Prior to Admission medications   Medication Sig Start Date End Date Taking? Authorizing Provider  albuterol (PROVENTIL HFA;VENTOLIN HFA) 108 (90 BASE) MCG/ACT inhaler Inhale 2 puffs into the lungs every 6  (six) hours as needed for wheezing. 04/27/12  Yes Cletus Gash, MD  Emtricitab-Rilpivir-Tenofovir 200-25-300 MG TABS Take 1 tablet by mouth daily. 02/09/13  Yes Truman Hayward, MD   No Known Allergies  FAMILY HISTORY:  Family History  Problem Relation Age of Onset  . COPD Sister   . Arthritis Mother   . Alcohol abuse Father     SOCIAL HISTORY:  reports that she has been smoking Cigarettes.  She has been smoking about 1.00 pack per day. She does not have any smokeless tobacco history on file. She reports that she drinks about 0.6 oz of alcohol per week. Her drug history is not on file.  REVIEW OF SYSTEMS:  All negative; except for those that are bolded, which indicate positives.  Constitutional: weight loss, weight gain, night sweats, fevers, chills, fatigue, weakness.  HEENT: headaches, sore throat, sneezing, nasal congestion, post nasal drip, difficulty swallowing, tooth/dental problems, visual complaints, visual changes, ear aches. Neuro: difficulty with speech, weakness, numbness, ataxia, headache. CV:  chest pain, orthopnea, PND, swelling in lower extremities, dizziness, palpitations, syncope.  Resp: cough, hemoptysis, dyspnea, wheezing. GI  heartburn, indigestion, abdominal pain, nausea, vomiting, diarrhea, constipation, change in bowel habits, loss of appetite, hematemesis, melena, hematochezia.  GU: dysuria, change in color of urine, urgency or frequency, flank pain, hematuria. MSK: joint pain or swelling, decreased range of motion. Psych: change in mood or affect, depression, anxiety, suicidal ideations, homicidal ideations. Skin: rash, itching, bruising.   SUBJECTIVE:   VITAL SIGNS: Temp:  [98.1 F (36.7 C)] 98.1 F (36.7 C) (11/08 1852) Pulse Rate:  [71-101] 101 (11/08 1852) Resp:  [17-27] 19 (  11/08 1852) BP: (137-194)/(69-116) 137/69 mmHg (11/08 1858) SpO2:  [97 %-100 %] 97 % (11/08 1800) HEMODYNAMICS:   VENTILATOR SETTINGS:   INTAKE /  OUTPUT: Intake/Output    None     PHYSICAL EXAMINATION: General: Adult AA female, in NAD. Neuro: A&O x 3, non-focal.  HEENT: [place]/AT. PERRL, sclerae anicteric. Cardiovascular: RRR, no M/R/G.  Lungs: Respirations even and unlabored.  CTA bilaterally, No W/R/R. Abdomen: BS x 4, soft, NT/ND.  Musculoskeletal: No gross deformities, no edema.  Skin: Intact, warm, no rashes.  LABS:  CBC  Recent Labs Lab 12/04/14 1607  HGB 13.9  HCT 41.0   Coag's No results for input(s): APTT, INR in the last 168 hours. BMET  Recent Labs Lab 12/04/14 1607  NA 142  K 3.4*  CL 106  BUN 5*  CREATININE 0.60  GLUCOSE 80   Electrolytes No results for input(s): CALCIUM, MG, PHOS in the last 168 hours. Sepsis Markers No results for input(s): LATICACIDVEN, PROCALCITON, O2SATVEN in the last 168 hours. ABG No results for input(s): PHART, PCO2ART, PO2ART in the last 168 hours. Liver Enzymes No results for input(s): AST, ALT, ALKPHOS, BILITOT, ALBUMIN in the last 168 hours. Cardiac Enzymes No results for input(s): TROPONINI, PROBNP in the last 168 hours. Glucose No results for input(s): GLUCAP in the last 168 hours.  Imaging Ct Angio Head W/cm &/or Wo Cm  12/04/2014  CLINICAL DATA:  Worst headache of life. Headache began today. Vomiting. EXAM: CT ANGIOGRAPHY HEAD TECHNIQUE: Multidetector CT imaging of the head was performed using the standard protocol during bolus administration of intravenous contrast. Multiplanar CT image reconstructions and MIPs were obtained to evaluate the vascular anatomy. CONTRAST:  185mL OMNIPAQUE IOHEXOL 350 MG/ML SOLN COMPARISON:  None. FINDINGS: CT HEAD Brain: Mild the subarachnoid hemorrhage. There is subtle hyperdensity anterior to the pons on the right extending into the suprasellar cistern. No other significant hemorrhage. Ventricles may be slightly dilated with mild prominence of the temporal horns. No shift of midline structures. No acute infarct or mass. Calvarium  and skull base: Negative Paranasal sinuses: Mild mucosal edema in the paranasal sinuses Orbits: Negative CTA HEAD Anterior circulation: Atherosclerotic calcification in the right cavernous carotid without significant stenosis. Aneurysm of the right posterior communicating artery measures 8.6 x 6.4 mm. Relatively wide neck of the aneurysm. The neck measures approximately 4.5 mm in diameter. Aneurysm enhances homogeneously without extravasation. Right anterior and middle cerebral arteries are patent bilaterally without significant stenosis. Atherosclerotic calcification left cavernous carotid without significant stenosis. Left anterior and middle cerebral arteries patent bilaterally without significant stenosis. Posterior circulation: Both vertebral arteries patent to the basilar. PICA patent bilaterally. Basilar patent. Superior cerebellar and posterior cerebral arteries patent bilaterally. No aneurysm in the posterior circulation. Venous sinuses: Patent Anatomic variants: None Delayed phase:Postcontrast imaging reveals mild enhancement of the subarachnoid space around the aneurysm on the right extending into the sylvian fissure compatible with subarachnoid hemorrhage enhancement. No enhancing mass lesion. IMPRESSION: Subarachnoid hemorrhage. There is a subtle hemorrhage in the pre pontine cistern on the right and in the right suprasellar cistern. 8.6 x 6.4 mm right posterior communicating artery aneurysm. Aneurysmal pattern of hemorrhage due to posterior communicating artery aneurysm. No other aneurysm. Critical Value/emergent results were called by telephone at the time of interpretation on 12/04/2014 at 5:14 pm to Dr. Theotis Burrow , who verbally acknowledged these results. Electronically Signed   By: Franchot Gallo M.D.   On: 12/04/2014 17:16     ASSESSMENT / PLAN:  NEUROLOGIC A:  SAH with PCOM aneurysm. Headache - due to above. P:   Neurosurgery following, planning for surgical intervention  11/9. Decadron, Mannitol, Albumin per neurosurgery. Fentanyl PRN.  CARDIOVASCULAR CVL L IJ 11/8 >>> A:  Tight BP control given SAH. Hx HTN, HLD. P:  Monitor hemodynamics. Cardene gtt with PRN labetalol to maintain SBP < 140.  PULMONARY A: Tobacco use disorder. P:   Albuterol PRN. Tobacco cessation counseling.  RENAL A:   Hypokalemia. P:   40 mEq K PO. BMP in AM.  GASTROINTESTINAL A:   Nutrition. P:   NPO.  HEMATOLOGIC A:   VTE Prophylaxis. P:  SCD's only. CBC in AM.  INFECTIOUS A:   Hx HIV, HBV. P:   Continue HAART regimen (changed to hospital formulary).  ENDOCRINE A:   No known issues.   P:   Monitor glucose on BMP.   Family updated: Family at bedside.  Interdisciplinary Family Meeting v Palliative Care Meeting:  Due by: 11/14.  CC time:  30 minutes.   Montey Hora, [place] [facility] Pager: [phone]  or [phone] 12/04/2014, 8:08 PM

## 2014-12-04 NOTE — H&P (Signed)
CC:  Chief Complaint  Patient presents with  . Headache  . Emesis    HPI: Connie Wade is a 59 y.o. female with worst headache of her life starting at about 2PM today.  Had an episode of nausea and vomiting.  She was seen at [facility] and found to have Dexter from a ruptured pcomm.  She was given 10mg  of decadron and states she's starting to feel better.  She denies weakness, numbness, vision changes.  PMH: Past Medical History  Diagnosis Date  . HIV 03/25/2006    since 2000  . HEPATITIS B 03/25/2006  . HYPERLIPIDEMIA 03/25/2006  . UTERINE FIBROID 03/25/2006  . ANEMIA, OTHER, UNSPECIFIED 03/25/2006  . TOBACCO DEPENDENCE 03/25/2006  . DEPRESSION 03/17/2007  . HYPERTENSION, BENIGN ESSENTIAL 04/26/2007  . HYDRADENITIS 03/25/2006  . Tubular adenoma of colon 06/23/2011    Benign, no high grade dysplasia 06/16/11 Followed by [name] Dr. Watt Climes Repeat in 5 years (due 2018)    PSH: Past Surgical History  Procedure Laterality Date  . Ovarian cyst removal  2001    SH: Social History  Substance Use Topics  . Smoking status: Current Every Day Smoker -- 1.00 packs/day    Types: Cigarettes  . Smokeless tobacco: None  . Alcohol Use: 0.6 oz/week    1 Glasses of wine per week     Comment: everday    MEDS: Prior to Admission medications   Medication Sig Start Date End Date Taking? Authorizing Provider  albuterol (PROVENTIL HFA;VENTOLIN HFA) 108 (90 BASE) MCG/ACT inhaler Inhale 2 puffs into the lungs every 6 (six) hours as needed for wheezing. 04/27/12  Yes Cletus Gash, MD  Emtricitab-Rilpivir-Tenofovir 200-25-300 MG TABS Take 1 tablet by mouth daily. 02/09/13  Yes Truman Hayward, MD    ALLERGY: No Known Allergies  ROS: ROS  NEUROLOGIC EXAM: Awake, alert, oriented Memory and concentration grossly intact Speech fluent, appropriate CN grossly intact Motor exam: Upper Extremities Deltoid Bicep Tricep Grip  Right 5/5 5/5 5/5 5/5  Left 5/5 5/5 5/5 5/5   Lower Extremity IP  Quad PF DF EHL  Right 5/5 5/5 5/5 5/5 5/5  Left 5/5 5/5 5/5 5/5 5/5   Sensation grossly intact to LT  IMGAING: CTA Head: Fisher 1 SAH, 22mm right pcomm  IMPRESSION: - 59 y.o. female with HH2, Fisher 1 SAH.  She has a right pcomm aneurysm.  I discussed the risks and benefits of treatment, coiling vs. clipping, and favor coiling.  Dr. Kathyrn Sheriff will likely do this tomorrow.  PLAN: - Dexamethasone 4q6 - Albumin 25g q6 hours hold for CVP > 12 - Mannitol 25g q6 hours hold for CVP < 8 - Maintain SBP < 140 - Arteriogram and likely coiling tomorrow - I appreciate PCCMs assistance

## 2014-12-04 NOTE — Procedures (Signed)
Arterial Catheter Insertion Procedure Note Ruqayyah Lute [id number] 09-May-1955  Procedure: Insertion of Arterial Catheter  Indications: Blood pressure monitoring and Frequent blood sampling  Procedure Details Consent: Risks of procedure as well as the alternatives and risks of each were explained to the (patient/caregiver).  Consent for procedure obtained. Time Out: Verified patient identification, verified procedure, site/side was marked, verified correct patient position, special equipment/implants available, medications/allergies/relevent history reviewed, required imaging and test results available.  Performed  Maximum sterile technique was used including antiseptics, cap, gloves, gown, hand hygiene, mask and sheet. Skin prep: Chlorhexidine; local anesthetic administered 20 gauge catheter was inserted into left radial artery using the Seldinger technique.  Evaluation Blood flow good; BP tracing good. Complications: No apparent complications.   Montey Hora, [place] [facility] Pager: [phone]  or [phone] 12/04/2014, 9:14 PM

## 2014-12-05 ENCOUNTER — Encounter (HOSPITAL_COMMUNITY)
Admission: EM | Disposition: A | Discharge status: Home / Self Care | Payer: Self-pay | Source: Home / Self Care | Attending: Neurosurgery

## 2014-12-05 ENCOUNTER — Inpatient Hospital Stay (HOSPITAL_COMMUNITY): Payer: Self-pay | Admitting: Critical Care Medicine

## 2014-12-05 ENCOUNTER — Inpatient Hospital Stay (HOSPITAL_COMMUNITY): Payer: Self-pay

## 2014-12-05 ENCOUNTER — Inpatient Hospital Stay (HOSPITAL_COMMUNITY): Admission: RE | Admit: 2014-12-05 | Payer: Self-pay | Source: Ambulatory Visit | Admitting: Neurological Surgery

## 2014-12-05 ENCOUNTER — Encounter (HOSPITAL_COMMUNITY): Payer: Self-pay | Admitting: Critical Care Medicine

## 2014-12-05 DIAGNOSIS — Z21 Asymptomatic human immunodeficiency virus [HIV] infection status: Secondary | ICD-10-CM

## 2014-12-05 DIAGNOSIS — I10 Essential (primary) hypertension: Secondary | ICD-10-CM

## 2014-12-05 HISTORY — PX: RADIOLOGY WITH ANESTHESIA: SHX6223

## 2014-12-05 LAB — COMPREHENSIVE METABOLIC PANEL
ALBUMIN: 3.6 g/dL (ref 3.5–5.0)
ALT: 16 U/L (ref 14–54)
AST: 20 U/L (ref 15–41)
Alkaline Phosphatase: 65 U/L (ref 38–126)
Anion gap: 9 (ref 5–15)
CHLORIDE: 107 mmol/L (ref 101–111)
CO2: 23 mmol/L (ref 22–32)
Calcium: 9.2 mg/dL (ref 8.9–10.3)
Creatinine, Ser: 0.6 mg/dL (ref 0.44–1.00)
GFR calc Af Amer: >60 mL/min (ref >60)
GLUCOSE: 120 mg/dL — AB (ref 65–99)
POTASSIUM: 4 mmol/L (ref 3.5–5.1)
Sodium: 139 mmol/L (ref 135–145)
Total Bilirubin: 0.6 mg/dL (ref 0.3–1.2)
Total Protein: 7.1 g/dL (ref 6.5–8.1)

## 2014-12-05 LAB — ABO/RH: ABO/RH(D): O POS

## 2014-12-05 LAB — TYPE AND SCREEN
ABO/RH(D): O POS
ANTIBODY SCREEN: NEGATIVE

## 2014-12-05 LAB — APTT: APTT: 30 s (ref 24–37)

## 2014-12-05 LAB — PROTIME-INR
INR: 1.04 (ref 0.00–1.49)
PROTHROMBIN TIME: 13.8 s (ref 11.6–15.2)

## 2014-12-05 SURGERY — RADIOLOGY WITH ANESTHESIA
Anesthesia: General

## 2014-12-05 SURGERY — CRANIOTOMY, FOR VERTEBRAL/BASILAR ARTERY ANEURYSM REPAIR
Anesthesia: General | Laterality: Right

## 2014-12-05 MED ORDER — GLYCOPYRROLATE 0.2 MG/ML IJ SOLN
INTRAMUSCULAR | Status: DC | PRN
  Administered 2014-12-05: 0.4 mg via INTRAVENOUS

## 2014-12-05 MED ORDER — RILPIVIRINE HCL 25 MG PO TABS
25.0000 mg | ORAL_TABLET | Freq: Every day | ORAL | Status: DC
  Filled 2014-12-05: qty 1

## 2014-12-05 MED ORDER — ROCURONIUM BROMIDE 100 MG/10ML IV SOLN
INTRAVENOUS | Status: DC | PRN
  Administered 2014-12-05: 50 mg via INTRAVENOUS

## 2014-12-05 MED ORDER — ONDANSETRON HCL 4 MG/2ML IJ SOLN
INTRAMUSCULAR | Status: DC | PRN
  Administered 2014-12-05: 4 mg via INTRAVENOUS

## 2014-12-05 MED ORDER — EMTRICITABINE-TENOFOVIR AF 200-25 MG PO TABS
1.0000 | ORAL_TABLET | Freq: Every day | ORAL | Status: DC
  Filled 2014-12-05 (×3): qty 1

## 2014-12-05 MED ORDER — LIDOCAINE HCL (CARDIAC) 20 MG/ML IV SOLN
INTRAVENOUS | Status: DC | PRN
  Administered 2014-12-05: 40 mg via INTRAVENOUS

## 2014-12-05 MED ORDER — PHENYLEPHRINE HCL 10 MG/ML IJ SOLN
10.0000 mg | INTRAVENOUS | Status: DC | PRN
  Administered 2014-12-05: 10 ug/min via INTRAVENOUS

## 2014-12-05 MED ORDER — PROPOFOL 10 MG/ML IV BOLUS
INTRAVENOUS | Status: DC | PRN
  Administered 2014-12-05: 150 mg via INTRAVENOUS
  Administered 2014-12-05: 20 mg via INTRAVENOUS
  Administered 2014-12-05: 50 mg via INTRAVENOUS

## 2014-12-05 MED ORDER — RILPIVIRINE HCL 25 MG PO TABS
25.0000 mg | ORAL_TABLET | Freq: Every day | ORAL | Status: DC
  Filled 2014-12-05 (×3): qty 1

## 2014-12-05 MED ORDER — SODIUM CHLORIDE 0.9 % IV SOLN
INTRAVENOUS | Status: DC | PRN
  Administered 2014-12-05: 14:00:00 via INTRAVENOUS

## 2014-12-05 MED ORDER — SODIUM CHLORIDE 0.9 % IV SOLN
INTRAVENOUS | Status: DC
  Administered 2014-12-05 – 2014-12-07 (×2): via INTRAVENOUS
  Administered 2014-12-08: 1000 mL via INTRAVENOUS
  Administered 2014-12-08: via INTRAVENOUS
  Administered 2014-12-09: 1000 mL via INTRAVENOUS
  Administered 2014-12-09: 17:00:00 via INTRAVENOUS
  Administered 2014-12-10: 1000 mL via INTRAVENOUS
  Administered 2014-12-10 – 2014-12-14 (×5): via INTRAVENOUS

## 2014-12-05 MED ORDER — FAMOTIDINE IN NACL 20-0.9 MG/50ML-% IV SOLN
20.0000 mg | INTRAVENOUS | Status: DC
  Administered 2014-12-05: 20 mg via INTRAVENOUS
  Filled 2014-12-05 (×2): qty 50

## 2014-12-05 MED ORDER — IOHEXOL 300 MG/ML  SOLN
150.0000 mL | Freq: Once | INTRAMUSCULAR | Status: DC | PRN
  Administered 2014-12-05: 115 mL via INTRA_ARTERIAL
  Filled 2014-12-05: qty 150

## 2014-12-05 MED ORDER — ESMOLOL HCL 100 MG/10ML IV SOLN
INTRAVENOUS | Status: DC | PRN
  Administered 2014-12-05: 70 mg via INTRAVENOUS
  Administered 2014-12-05: 30 mg via INTRAVENOUS

## 2014-12-05 MED ORDER — EMTRICITABINE-TENOFOVIR AF 200-25 MG PO TABS
1.0000 | ORAL_TABLET | Freq: Every day | ORAL | Status: DC
  Filled 2014-12-05: qty 1

## 2014-12-05 MED ORDER — CEFAZOLIN SODIUM-DEXTROSE 2-3 GM-% IV SOLR
INTRAVENOUS | Status: AC
  Filled 2014-12-05: qty 50

## 2014-12-05 MED ORDER — NEOSTIGMINE METHYLSULFATE 10 MG/10ML IV SOLN
INTRAVENOUS | Status: DC | PRN
Start: 2014-12-05 — End: 2014-12-05
  Administered 2014-12-05: 3 mg via INTRAVENOUS

## 2014-12-05 MED ORDER — WHITE PETROLATUM GEL
Status: AC
  Administered 2014-12-05: 07:00:00
  Filled 2014-12-05: qty 1

## 2014-12-05 MED ORDER — FENTANYL CITRATE (PF) 250 MCG/5ML IJ SOLN
INTRAMUSCULAR | Status: DC | PRN
  Administered 2014-12-05 (×4): 50 ug via INTRAVENOUS
  Administered 2014-12-05: 100 ug via INTRAVENOUS

## 2014-12-05 MED ORDER — SODIUM CHLORIDE 0.9 % IV SOLN
INTRAVENOUS | Status: DC | PRN
  Administered 2014-12-05: 13:00:00 via INTRAVENOUS

## 2014-12-05 NOTE — Anesthesia Preprocedure Evaluation (Addendum)
Anesthesia Evaluation  Patient identified by MRN, date of birth, ID band Patient awake    Reviewed: Allergy & Precautions, NPO status , Patient's Chart, lab work & pertinent test results  History of Anesthesia Complications Negative for: history of anesthetic complications  Airway Mallampati: II  TM Distance: >3 FB Neck ROM: Full    Dental  (+) Dental Advisory Given, Missing   Pulmonary Current Smoker,    Pulmonary exam normal        Cardiovascular hypertension, + Peripheral Vascular Disease  Normal cardiovascular exam     Neuro/Psych PSYCHIATRIC DISORDERS Depression CVA    GI/Hepatic negative GI ROS, (+) Hepatitis -, B  Endo/Other  negative endocrine ROS  Renal/GU negative Renal ROS     Musculoskeletal   Abdominal   Peds  Hematology  (+) HIV,   Anesthesia Other Findings   Reproductive/Obstetrics                           Anesthesia Physical Anesthesia Plan  ASA: III and emergent  Anesthesia Plan: General   Post-op Pain Management:    Induction: Intravenous  Airway Management Planned: Oral ETT  Additional Equipment: Arterial line  Intra-op Plan:   Post-operative Plan: Possible Post-op intubation/ventilation  Informed Consent: I have reviewed the patients History and Physical, chart, labs and discussed the procedure including the risks, benefits and alternatives for the proposed anesthesia with the patient or authorized representative who has indicated his/her understanding and acceptance.   Dental advisory given  Plan Discussed with: CRNA and Anesthesiologist  Anesthesia Plan Comments:        Anesthesia Quick Evaluation

## 2014-12-05 NOTE — Sedation Documentation (Signed)
Pt transferred from 3M12. Awake and alert. MAE X4. Speech clear and appropriate. In no distress. Anesthesia at bedside.

## 2014-12-05 NOTE — Progress Notes (Addendum)
PULMONARY / CRITICAL CARE MEDICINE   Name: Connie Wade MRN: [id number] DOB: 1955/02/25    ADMISSION DATE:  12/04/2014 CONSULTATION DATE:  12/04/14  REFERRING MD :  Ditty  CHIEF COMPLAINT:  Headache  INITIAL PRESENTATION:  59 y.o. F presented to [facility] ED 11/8 with worse headache of her life.  Found to have [facility] for which she was transferred to [facility] for neurosurgical consultation.  PCCM called for assistance with medical management and for placement of CVL.  She will likely undergo surgical intervention 11/9 with Dr. Kathyrn Sheriff..   STUDIES:  CTA head 11/8 >>> SAH with 8.6 x 6.87mm right PCOM artery aneurysm.  SIGNIFICANT EVENTS: 11/8 - admitted with [facility].  LINES: Left IJ CVC 11/8>> Left Radial Art 11/8>>  SUBJECTIVE: Denies any dyspnea or cough. Denies any headache or acute vision changes. Planned for aneurysmal coiling this afternoon.  REVIEW OF SYSTEMS: No chest pain or pressure. No subjective fever, chills, or sweats.  VITAL SIGNS: Temp:  [98.1 F (36.7 C)-99.5 F (37.5 C)] 98.3 F (36.8 C) (11/09 0800) Pulse Rate:  [67-102] 88 (11/09 0900) Resp:  [10-27] 14 (11/09 0900) BP: (108-194)/(62-116) 135/72 mmHg (11/09 0905) SpO2:  [95 %-100 %] 100 % (11/09 0900) Weight:  [49.5 kg (109 lb 2 oz)] 49.5 kg (109 lb 2 oz) (11/08 2100) HEMODYNAMICS: CVP:  [5 mmHg-9 mmHg] 9 mmHg VENTILATOR SETTINGS:   INTAKE / OUTPUT: Intake/Output      11/08 0701 - 11/09 0700 11/09 0701 - 11/10 0700   I.V. (mL/kg) 817.3 (16.5)    IV Piggyback 200    Total Intake(mL/kg) 1017.3 (20.6)    Urine (mL/kg/hr) 950    Total Output 950     Net +67.3            PHYSICAL EXAMINATION: General:  Awake. Alert. No acute distress. Sitting up in bed.  Integument:  Warm & dry. No rash on exposed skin. No bruising. HEENT:  Moist mucus membranes. No oral ulcers. No scleral injection or icterus.  Cardiovascular:  Regular rate. No edema. No appreciable JVD.  Pulmonary:  Good aeration & clear to auscultation  bilaterally. Symmetric chest wall expansion. No accessory muscle use on room air. Abdomen: Soft. Normal bowel sounds. Nondistended. Grossly nontender. Musculoskeletal:  Normal bulk and tone. Hand grip, plantarflexion, & dorsiflexion strength 5/5 bilaterally. No joint deformity or effusion appreciated. Neurological:  Oriented person, place, and time. Grossly nonfocal.  LABS:  CBC  Recent Labs Lab 12/04/14 1607 12/04/14 2325  WBC  --  7.0  HGB 13.9 11.7*  HCT 41.0 34.8*  PLT  --  422*   Coag's  Recent Labs Lab 12/04/14 2325  APTT 30  INR 1.04   BMET  Recent Labs Lab 12/04/14 1607 12/04/14 2325  NA 142 139  K 3.4* 4.0  CL 106 107  CO2  --  23  BUN 5* <5*  CREATININE 0.60 0.60  GLUCOSE 80 120*   Electrolytes  Recent Labs Lab 12/04/14 2325  CALCIUM 9.2   Sepsis Markers No results for input(s): LATICACIDVEN, PROCALCITON, O2SATVEN in the last 168 hours. ABG No results for input(s): PHART, PCO2ART, PO2ART in the last 168 hours. Liver Enzymes  Recent Labs Lab 12/04/14 2325  AST 20  ALT 16  ALKPHOS 65  BILITOT 0.6  ALBUMIN 3.6   Cardiac Enzymes No results for input(s): TROPONINI, PROBNP in the last 168 hours. Glucose No results for input(s): GLUCAP in the last 168 hours.  Imaging Ct Angio Head W/cm &/or Wo Cm  12/04/2014  CLINICAL DATA:  Worst headache of life. Headache began today. Vomiting. EXAM: CT ANGIOGRAPHY HEAD TECHNIQUE: Multidetector CT imaging of the head was performed using the standard protocol during bolus administration of intravenous contrast. Multiplanar CT image reconstructions and MIPs were obtained to evaluate the vascular anatomy. CONTRAST:  154mL OMNIPAQUE IOHEXOL 350 MG/ML SOLN COMPARISON:  None. FINDINGS: CT HEAD Brain: Mild the subarachnoid hemorrhage. There is subtle hyperdensity anterior to the pons on the right extending into the suprasellar cistern. No other significant hemorrhage. Ventricles may be slightly dilated with mild  prominence of the temporal horns. No shift of midline structures. No acute infarct or mass. Calvarium and skull base: Negative Paranasal sinuses: Mild mucosal edema in the paranasal sinuses Orbits: Negative CTA HEAD Anterior circulation: Atherosclerotic calcification in the right cavernous carotid without significant stenosis. Aneurysm of the right posterior communicating artery measures 8.6 x 6.4 mm. Relatively wide neck of the aneurysm. The neck measures approximately 4.5 mm in diameter. Aneurysm enhances homogeneously without extravasation. Right anterior and middle cerebral arteries are patent bilaterally without significant stenosis. Atherosclerotic calcification left cavernous carotid without significant stenosis. Left anterior and middle cerebral arteries patent bilaterally without significant stenosis. Posterior circulation: Both vertebral arteries patent to the basilar. PICA patent bilaterally. Basilar patent. Superior cerebellar and posterior cerebral arteries patent bilaterally. No aneurysm in the posterior circulation. Venous sinuses: Patent Anatomic variants: None Delayed phase:Postcontrast imaging reveals mild enhancement of the subarachnoid space around the aneurysm on the right extending into the sylvian fissure compatible with subarachnoid hemorrhage enhancement. No enhancing mass lesion. IMPRESSION: Subarachnoid hemorrhage. There is a subtle hemorrhage in the pre pontine cistern on the right and in the right suprasellar cistern. 8.6 x 6.4 mm right posterior communicating artery aneurysm. Aneurysmal pattern of hemorrhage due to posterior communicating artery aneurysm. No other aneurysm. Critical Value/emergent results were called by telephone at the time of interpretation on 12/04/2014 at 5:14 pm to Dr. Theotis Burrow , who verbally acknowledged these results. Electronically Signed   By: Franchot Gallo M.D.   On: 12/04/2014 17:16   Dg Chest Port 1 View  12/04/2014  CLINICAL DATA:  Central line  placement. EXAM: PORTABLE CHEST 1 VIEW COMPARISON:  09/13/2012 FINDINGS: Tip of the left internal jugular central line in the mid SVC. No pneumothorax. The cardiomediastinal contours are normal. Pulmonary vasculature is normal. No consolidation or pleural effusion. No acute osseous abnormalities are seen. IMPRESSION: Tip of the left central line in the mid SVC.  No pneumothorax. Electronically Signed   By: Jeb Levering M.D.   On: 12/04/2014 21:54     ASSESSMENT / PLAN:  Very pleasant 59 year old female with known history of hypertension as well as HIV infection presenting with a headache secondary to subarachnoid hemorrhage from a PCOM intracerebral aneurysm. Patient is planned for coiling of aneurysm this afternoon by neurosurgery. We will continue to follow postoperatively to ensure that the patient has appropriate blood pressure control.   1. SAH from PCOM Intracerebral Aneurysm:  Planned for coiling today by Neurosurgery. Management per Neurosurgery. Currently on Decadron, Mannitol, & albumin per Neurosurgery. Fentanyl IV for pain as needed.  2. Hypertension: Continue lisinopril 10 mg by mouth daily. Goal systolic blood pressure per Neurosurgery. IV Labetalol & Cardene infusion as needed to maintain goal SBP <140. 3. HIV Infection:  Continuing Descovy & Edurant outpatient medications. 4. Hypokalemia: Resolved. Continuing to trend electrolytes and renal function daily. 5. Prophylaxis:  SCDs & Protonix IV. 6. Diet:  NPO pending procedure. 7. Chronic Tobacco Use:  Continuing outpatient albuterol via nebulizer as needed. Recommended tobacco cessation education prior to discharge.   I spent a total of 31 minutes of critical care time today caring for the patient & reviewing the patient's electronic medical record.  Sonia Baller Ashok Cordia, M.D. [facility]monary [facility] Critical [facility] Pager:  [phone] After 3pm or if no response, call [phone]  12/05/2014, 11:40 AM

## 2014-12-05 NOTE — Progress Notes (Signed)
Pt seen in PACU. Awake, alert. FC, MAE with good strength.

## 2014-12-05 NOTE — Transfer of Care (Signed)
Immediate Anesthesia Transfer of Care Note  Patient: Connie Wade  Procedure(s) Performed: Procedure(s): RADIOLOGY WITH ANESTHESIA-COING FOR SUBARACHNOID HEMORRHAGE (N/A)  Patient Location: PACU  Anesthesia Type:General  Level of Consciousness: awake, alert , oriented and patient cooperative  Airway & Oxygen Therapy: Patient Spontanous Breathing and Patient connected to nasal cannula oxygen  Post-op Assessment: Report given to RN, Post -op Vital signs reviewed and stable and Patient moving all extremities  Post vital signs: Reviewed and stable  Last Vitals:  Filed Vitals:   12/05/14 0905  BP: 135/72  Pulse:   Temp:   Resp:     Complications: No apparent anesthesia complications

## 2014-12-05 NOTE — Progress Notes (Signed)
No issues overnight. Pts history reviewed. Had sudden onset of HA last night. Able to drive herself to the ED. HA has improved this am. Has hx of untreated HTN, no family Hx of aneurysms, smokes 1ppd x 39yrs.  EXAM:  BP 135/72 mmHg  Pulse 88  Temp(Src) 98.3 F (36.8 C) (Oral)  Resp 14  Ht 5\' 2"  (1.575 m)  Wt 49.5 kg (109 lb 2 oz)  BMI 19.95 kg/m2  SpO2 100%  Awake, alert, oriented  Speech fluent, appropriate  CN grossly intact  5/5 BUE/BLE   IMAGING: CT shows subtle pos fossa SAH, no HCP. CTA demonstrates an ~56mm right Pcom aneurysm  IMPRESSION:  59 y.o. female [place] d# 1, Hunt-H[place] 1, Fisher 2  PLAN: - Proceed with diagnostic cerebral angiogram, appropriate treatment of aneurysm  I spoke at length with the patient regarding the imaging findings thus far. I explained to them that the definitive diagnosis is made by diagnostic angiogram. I also explained to them the possible treatment options for intracranial aneurysms including endovascular coiling and open clip ligation. The risks of the angiogram, coiling, and surgical clipping were also reviewed to include stroke and aneurysm re-rupture leading to weakness/paralysis/coma/death, infection, SZ, hydrocephalus.   The patient understood our discussion and she provided consent to proceed with diagnostic angiogram and the appropriate treatment for any identified aneurysm.

## 2014-12-05 NOTE — Op Note (Signed)
DIAGNOSTIC CEREBRAL ANGIOGRAM COIL EMBOLIZATION OF RIGHT POSTERIOR COMMUNICATING ARTERY ANEURYSM   OPERATOR:  Dr. Consuella Lose, MD  HISTORY:  The patient is a 59 y.o. female initially presenting with severe HA, and CT demonstrated SAH. CTA showed a right Pcom aneurysm. She presents for further workup and possible treatment.  APPROACH:  The technical aspects of the procedure as well as its potential risks and benefits were reviewed with the patient. These risks included but were not limited bleeding, infection, allergic reaction, damage to organs/vital structures, stroke, non-diagnostic procedure, and the catastrophic outcomes of heart attack, coma, and death. With an understanding of these risks, informed consent was obtained and witnessed.   The patient was placed in the supine position on the angiography table and the skin of right groin prepped in the usual sterile fashion. The procedure was performed under general anesthesia.  A 5- French sheath was introduced in the right common femoral artery using Seldinger technique. A fluorophase sequence was used to document the sheath position.   HEPARIN: 0 Units total.  CONTRAST AGENT: 150cc, Omnipaque 300  FLUOROSCOPY TIME: 43.9 combined AP and lateral minutes   CATHETER(S) AND WIRE(S):  5Fr JB-1 Glidecathter 0.035" glidewire  80 cm 6 [place] NeuronMax guide sheath 120 cm 6 [place] Berenstein select guide catheter 115 cm 058 catalyst 5 guide catheter 150 cm 027 marksman microcatheter XT-17 microcatheter Synchro 2 standard microwire  VESSELS CATHETERIZED:  Right common carotid   Right internal carotid Left internal carotid   Right vertebral   Left vertebral   Right common femoral Right common femoral  VESSELS STUDIED:  Aortic arch Right common carotid, neck Right internal carotid, head Right vertebral Left internal carotid, head Left vertebral Right femoral  Right internal carotid,  pre-embolization Right internal carotid, during embolization Right internal carotid, immediate post embolization Right internal carotid, final control  COILS DEPLOYED (MRI COMPATIBLE): Target 360 Standard 30mm x 20cm Target 360 Soft 70mm x 15cm Target 360 Soft 58mm x 15cm Target 360 Soft 35mm x 10cm Target 360 Ultra 79mm x 10cm Target 360 Ultra 68mm x 6cm  PROCEDURAL NARRATIVE:  A 5-Fr JB-1 terumo glide catheter was advanced over a 0.035 glidewire into the aortic arch. The above vessels were then sequentially catheterized and cervical/cerebral angiograms taken. After review of images, the catheter was removed without incident.   The 5Fr sheath was then exchanged for an 8Fr sheath. The guide sheath was then introduced over the select guide catheter and 035 wire. The right common carotid artery was then selected, and the guide sheath was advanced into the proximal right common. Under roadmap guidance, the right internal was selected, and the guide sheath was advanced into the proximal right internal. The select catheter was then removed without incident. The guide catheter was then introduced over the XT-27 microcatheter and Synchro standard microwire. The microcatheter was advanced into the supraclinoid internal carotid artery, and the catalyst guide catheter was then advanced to its final position in the petrous ICA. The XT-47microcatheter was then removed, and the XT 17 microcatheter was introduced. The aneurysm was then catheterized. The above coils were then sequentially deployed, with intermittent angiograms taken to confirm good position. Immediate postembolization angiogram was then taken. The microcatheter was then removed without incident. The guide catheter and sheath were then withdrawn into the distal cervical internal carotid artery, and final control angiogram was taken. The entire catheter construct was then removed synchronously without incident.  INTERPRETATION: Aortic arch:    Type  II, normal three vessel arch  configuration. No significant ostial stenosis.   Right common carotid: neck:   There is mild calcific atherosclerotic plaque involving the origin of the origin of the right internal carotid artery. Stenosis measures less than 50%. There is no flow limitation.   Right internal carotid: head:   Injection reveals the presence of a widely patent ICA, M1, and A1 segments and their branches. There is a posteriorly projecting multilobulated aneurysm arising in the community taking segment of the internal carotid artery. This aneurysm measures approximately 7.5 x 6.2 mm.  Also noted is luminal irregularity likely representing atherosclerotic disease involving the horizontal cavernous internal carotid artery. The parenchymal and venous phases are normal. The venous sinuses are widely patent.    Left internal carotid: head:   Injection reveals the presence of a widely patent ICA, A1, and M1 segments and their branches. There is atherosclerotic disease involving the cavernous segment of the internal carotid artery. There is a small ophthalmic segment aneurysm measuring approximately 2.3 x 2.1 mm, projecting anteriorly. The parenchymal and venous phases are normal. The venous sinuses are widely patent.    Right vertebral:   Injection reveals the presence of a widely patent vertebral artery. This leads to a widely patent basilar artery that terminates in bilateral P1. The basilar apex is normal. There is no significant stenosis, occlusion, aneurysm, or vascular malformation visualized. The parenchymal and venous phases are normal. The venous sinuses are widely patent.    Left vertebral:    Normal vessel. No PICA aneurysm. See basilar description above.    Right internal carotid, pre-embolization: Injection again demonstrates patency of the internal carotid artery, middle cerebral, and anterior cerebral arteries. The posteriorly directed posterior communicating artery aneurysm is  again seen. Mild contrast stasis within the dome of the aneurysm is noted.  Right internal carotid, during embolization: Angiograms taken during embolization demonstrate progressive exclusion of the aneurysm, without any coil prolapse into the parent artery. No filling defects are seen to suggest thrombus.  Right internal carotid, immediate postembolization: Injection demonstrates patency of the internal carotid, anterior cerebral, and middle cerebral arteries. Coil masses seen within the posterior communicating artery aneurysm, without coil prolapse. There is minimal aneurysm filling primarily at the proximal neck. No filling defects are seen.  Right internal carotid, final control: Mid to distal cervical portions of the internal carotid artery appear normal, without evidence of dissection. Injection reveals the presence of a widely patent ICA, M1, and A1 segments and their branches. Coil mass within the posterior communicating artery aneurysm appears stable, without coil prolapse. The parenchymal and venous phases are normal. The venous sinuses are widely patent.    Right femoral:  Normal vessel. No significant atherosclerotic disease. Arterial sheath in adequate position.   DISPOSITION:  Upon completion of the study, the femoral sheath was removed and hemostasis obtained using a 7-Fr ExoSeal closure device. Good proximal and distal lower extremity pulses were documented upon achievement of hemostasis.   The procedure was well tolerated and no early complications were observed.    The patient was extubated and taken to the postanesthesia care unit in stable hemodynamic condition for further care.  IMPRESSION:  1. Successful coil embolization of a right posterior communicating artery aneurysm as the likely source of subarachnoid hemorrhage. 2. 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 3. Atherosclerotic disease involving the origin of the right  internal carotid artery in the neck, without significant stenosis or flow limitation. 4. Bilateral cavernous internal carotid artery atherosclerotic disease without flow  limitation.  The preliminary results of this procedure were shared with the patient and the patient's family.

## 2014-12-05 NOTE — Anesthesia Procedure Notes (Signed)
Procedure Name: Intubation Date/Time: 12/05/2014 1:56 PM Performed by: Merrilyn Puma B Pre-anesthesia Checklist: Patient identified, Emergency Drugs available, Suction available, Patient being monitored and Timeout performed Patient Re-evaluated:Patient Re-evaluated prior to inductionOxygen Delivery Method: Circle system utilized Preoxygenation: Pre-oxygenation with 100% oxygen Intubation Type: IV induction and Cricoid Pressure applied Laryngoscope Size: Mac and 3 Grade View: Grade III Tube type: Subglottic suction tube Tube size: 7.0 mm Number of attempts: 2 Airway Equipment and Method: Stylet Placement Confirmation: ETT inserted through vocal cords under direct vision,  positive ETCO2,  CO2 detector and breath sounds checked- equal and bilateral Secured at: 22 cm Tube secured with: Tape Dental Injury: Teeth and Oropharynx as per pre-operative assessment

## 2014-12-05 NOTE — Progress Notes (Signed)
Pharmacy Drug Interaction Progress Note  This patient is on rilpivirine for her HIV infection.  There is a drug interaction with rilpivirine and any acid-suppressing medications (PPI, H2 blockers), as they could decrease the amount of rilpivirine that is absorbed.  Spoke with Dr. Ashok Cordia, since patient is going to OR and will be intubated and need stress ulcer ppx, will switch from Protonix to Pepcid once daily scheduled at night (rilpivirine given at lunch).  Per guidelines, ok to give once daily H2 blockers with rilpivirine as long as it is at least 4 hours after rilpivirine administration.  Please reassess if patient needs Pepcid once out of surgery and extubated.   Thank you!  Ilija Maxim L. Nicole Kindred, PharmD PGY2 Infectious Diseases Pharmacy Resident Pager: [phone] 12/05/2014 12:10 PM

## 2014-12-05 NOTE — Anesthesia Postprocedure Evaluation (Signed)
Anesthesia Post Note  Patient: Connie Wade  Procedure(s) Performed: Procedure(s) (LRB): RADIOLOGY WITH ANESTHESIA-COING FOR SUBARACHNOID HEMORRHAGE (N/A)  Anesthesia type: general  Patient location: PACU  Post pain: Pain level controlled  Post assessment: Patient's Cardiovascular Status Stable  Last Vitals:  Filed Vitals:   12/05/14 1730  BP:   Pulse: 84  Temp:   Resp: 17    Post vital signs: Reviewed and stable  Level of consciousness: sedated  Complications: No apparent anesthesia complications

## 2014-12-05 NOTE — Progress Notes (Signed)
Pt has 3 plus pedal pulses

## 2014-12-05 NOTE — Progress Notes (Signed)
No acute events.  Headache better. AVSS Awake and alert Motor 5/5, no drift Stable Dr. Kathyrn Sheriff to see today for arteriogram and possible coiling

## 2014-12-06 ENCOUNTER — Encounter (HOSPITAL_COMMUNITY): Payer: Self-pay | Admitting: Neurosurgery

## 2014-12-06 ENCOUNTER — Inpatient Hospital Stay (HOSPITAL_COMMUNITY): Payer: Self-pay

## 2014-12-06 ENCOUNTER — Other Ambulatory Visit (HOSPITAL_COMMUNITY): Payer: Self-pay

## 2014-12-06 ENCOUNTER — Encounter: Payer: Self-pay | Admitting: *Deleted

## 2014-12-06 DIAGNOSIS — I609 Nontraumatic subarachnoid hemorrhage, unspecified: Secondary | ICD-10-CM

## 2014-12-06 DIAGNOSIS — Z720 Tobacco use: Secondary | ICD-10-CM

## 2014-12-06 LAB — CBC WITH DIFFERENTIAL/PLATELET
BASOS ABS: 0 10*3/uL (ref 0.0–0.1)
BASOS PCT: 0 %
Eosinophils Absolute: 0 10*3/uL (ref 0.0–0.7)
Eosinophils Relative: 0 %
HEMATOCRIT: 27.8 % — AB (ref 36.0–46.0)
Hemoglobin: 9.5 g/dL — ABNORMAL LOW (ref 12.0–15.0)
Lymphocytes Relative: 6 %
Lymphs Abs: 1.2 10*3/uL (ref 0.7–4.0)
MCH: 25.7 pg — ABNORMAL LOW (ref 26.0–34.0)
MCHC: 34.2 g/dL (ref 30.0–36.0)
MCV: 75.1 fL — ABNORMAL LOW (ref 78.0–100.0)
MONO ABS: 0.8 10*3/uL (ref 0.1–1.0)
Monocytes Relative: 4 %
NEUTROS ABS: 18 10*3/uL — AB (ref 1.7–7.7)
NEUTROS PCT: 90 %
Platelets: 341 10*3/uL (ref 150–400)
RBC: 3.7 MIL/uL — ABNORMAL LOW (ref 3.87–5.11)
RDW: 17.7 % — AB (ref 11.5–15.5)
WBC: 19.9 10*3/uL — AB (ref 4.0–10.5)

## 2014-12-06 LAB — RENAL FUNCTION PANEL
ALBUMIN: 3.6 g/dL (ref 3.5–5.0)
ANION GAP: 5 (ref 5–15)
BUN: 9 mg/dL (ref 6–20)
CALCIUM: 8.7 mg/dL — AB (ref 8.9–10.3)
CO2: 22 mmol/L (ref 22–32)
Chloride: 110 mmol/L (ref 101–111)
Creatinine, Ser: 0.64 mg/dL (ref 0.44–1.00)
GLUCOSE: 131 mg/dL — AB (ref 65–99)
PHOSPHORUS: 3.2 mg/dL (ref 2.5–4.6)
Potassium: 4 mmol/L (ref 3.5–5.1)
SODIUM: 137 mmol/L (ref 135–145)

## 2014-12-06 LAB — MAGNESIUM: Magnesium: 1.8 mg/dL (ref 1.7–2.4)

## 2014-12-06 MED ORDER — ACETAMINOPHEN-CODEINE #3 300-30 MG PO TABS
1.0000 | ORAL_TABLET | Freq: Four times a day (QID) | ORAL | Status: DC | PRN
  Administered 2014-12-06: 1 via ORAL
  Administered 2014-12-06: 2 via ORAL
  Administered 2014-12-06: 1 via ORAL
  Administered 2014-12-07 – 2014-12-10 (×10): 2 via ORAL
  Administered 2014-12-11 – 2014-12-12 (×3): 1 via ORAL
  Administered 2014-12-13: 2 via ORAL
  Administered 2014-12-14: 1 via ORAL
  Filled 2014-12-06: qty 2
  Filled 2014-12-06 (×2): qty 1
  Filled 2014-12-06: qty 2
  Filled 2014-12-06: qty 1
  Filled 2014-12-06 (×3): qty 2
  Filled 2014-12-06: qty 1
  Filled 2014-12-06 (×2): qty 2
  Filled 2014-12-06: qty 1
  Filled 2014-12-06 (×5): qty 2
  Filled 2014-12-06: qty 1

## 2014-12-06 NOTE — Evaluation (Signed)
Speech Language Pathology Evaluation Patient Details Name: Emberlee Finchum MRN: [id number] DOB: [birth year] Today's Date: 12/06/2014 Time: [id number] SLP Time Calculation (min) (ACUTE ONLY): 16 min  Problem List:  Patient Active Problem List   Diagnosis Date Noted  . Ruptured cerebral aneurysm ([place]) 12/04/2014  . Subarachnoid hemorrhage due to ruptured aneurysm ([place]) 12/04/2014  . Encounter for central line placement   . Sudden onset of severe headache   . Ruptured cerebral aneurysm not due to trauma ([place])   . Low back strain 03/07/2013  . Tobacco abuse 09/19/2012  . Acute bronchitis 09/19/2012  . Loose stools 04/27/2012  . Tubular adenoma of colon 06/23/2011  . Anterior chest wall pain 06/27/2010  . HYPERTENSION, BENIGN ESSENTIAL 04/26/2007  . DEPRESSION 03/17/2007  . Degenerative disc disease, lumbar 03/17/2007  . Human immunodeficiency virus (HIV) disease ([place]) 03/25/2006  . HEPATITIS B 03/25/2006  . UTERINE FIBROID 03/25/2006  . HYPERLIPIDEMIA 03/25/2006  . ANEMIA, OTHER, UNSPECIFIED 03/25/2006  . Tobacco abuse counseling 03/25/2006  . RHINITIS, ALLERGIC 03/25/2006  . OVARIAN CYST 03/25/2006  . MENOPAUSAL SYNDROME 03/25/2006   Past Medical History:  Past Medical History  Diagnosis Date  . HIV 03/25/2006    since 2000  . HEPATITIS B 03/25/2006  . HYPERLIPIDEMIA 03/25/2006  . UTERINE FIBROID 03/25/2006  . ANEMIA, OTHER, UNSPECIFIED 03/25/2006  . TOBACCO DEPENDENCE 03/25/2006  . DEPRESSION 03/17/2007  . HYPERTENSION, BENIGN ESSENTIAL 04/26/2007  . HYDRADENITIS 03/25/2006  . Tubular adenoma of colon 06/23/2011    Benign, no high grade dysplasia 06/16/11 Followed by [name] Dr. Watt Climes Repeat in 5 years (due 2018)   Past Surgical History:  Past Surgical History  Procedure Laterality Date  . Ovarian cyst removal  2001  . Radiology with anesthesia N/A 12/05/2014    Procedure: RADIOLOGY WITH ANESTHESIA-COING FOR SUBARACHNOID HEMORRHAGE;  Surgeon: Consuella Lose, MD;  Location:  [place];  Service: Radiology;  Laterality: N/A;   HPI:  59 y.o. female admitted with SAH; underwent coil embolization of right posterior communicating artery aneurysm 11/9.    Assessment / Plan / Recommendation Clinical Impression  Pt presents with normal language, speech, and cognition with good insight into current condition; good judgment.  No SLP f/u required.  Will sign off.     SLP Assessment  Patient does not need any further Speech Lanaguage Pathology Services    Follow Up Recommendations  None    Frequency and Duration           SLP Evaluation Prior Functioning  Cognitive/Linguistic Baseline: Within functional limits Vocation: Retired   Associate Professor  Overall Cognitive Status: Within Functional Limits for tasks assessed Orientation Level: Oriented X4    Comprehension  Auditory Comprehension Overall Auditory Comprehension: Appears within functional limits for tasks assessed Visual Recognition/Discrimination Discrimination: Within Function Limits Reading Comprehension Reading Status: Within funtional limits    Expression Expression Primary Mode of Expression: Verbal Verbal Expression Overall Verbal Expression: Appears within functional limits for tasks assessed Initiation: No impairment Written Expression Dominant Hand: Right   Oral / Motor Oral Motor/Sensory Function Overall Oral Motor/Sensory Function: Within functional limits Motor Speech Overall Motor Speech: Appears within functional limits for tasks assessed    Juan Quam Laurice 12/06/2014, 10:31 AM

## 2014-12-06 NOTE — Progress Notes (Addendum)
Pt seen and examined. No issues overnight. Minimal HA this am, does have a cough  EXAM: Temp:  [97.7 F (36.5 C)-98.4 F (36.9 C)] 98 F (36.7 C) (11/10 0432) Pulse Rate:  [53-88] 67 (11/10 0700) Resp:  [10-32] 12 (11/10 0700) BP: (122-153)/(67-90) 149/67 mmHg (11/10 0700) SpO2:  [98 %-100 %] 100 % (11/10 0700) Arterial Line BP: (142-169)/(65-78) 162/73 mmHg (11/10 0700) Intake/Output      11/09 0701 - 11/10 0700 11/10 0701 - 11/11 0700   P.O. 720    I.V. (mL/kg) 2200 (44.4)    IV Piggyback 50    Total Intake(mL/kg) 2970 (60)    Urine (mL/kg/hr) 1000 (0.8)    Total Output 1000     Net +1970           Awake, alert, oriented Speech fluent CN intact MAE well, symmetric  LABS: Lab Results  Component Value Date   CREATININE 0.64 12/06/2014   BUN 9 12/06/2014   NA 137 12/06/2014   K 4.0 12/06/2014   CL 110 12/06/2014   CO2 22 12/06/2014   Lab Results  Component Value Date   WBC 19.9* 12/06/2014   HGB 9.5* 12/06/2014   HCT 27.8* 12/06/2014   MCV 75.1* 12/06/2014   PLT 341 12/06/2014    IMPRESSION: - 60 y.o. female [place] d# 2 s/p right Pcom coiling, doing well  PLAN: - Cont routine spasm management/prophylaxis - Mobilize - d/c foley - Tylenol with codeine for cough/HA

## 2014-12-06 NOTE — Progress Notes (Signed)
Utilization review completed. Raphael Espe, RN, BSN. 

## 2014-12-06 NOTE — Progress Notes (Signed)
Patient ID: Connie Wade, female   DOB: [birth year], 59 y.o.   MRN: [id number]  [name] called me from her ICU room concerned about not receiving her Complera in the hospital. She stated she had mentioned that she was on a research study and needed to take her medication.  [name] seems slightly confused. I had to remind her that we had to take her off of study (in October) because she had missed more than 21 days (more like 3 months) of her Complera. I made her an appointment with Dr. Tommy Medal to discuss this but she ended up in the hospital.  Dr. Tommy Medal is aware that she was not taking her Complera and it is OKAY for her to not take it while she is in the hospital. 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  I explained/assured to her that she does not need to take her COMPLERA.  Her regimen will be discussed at an office visit with Dr. Tommy Medal and they will come to a conclusion on what to do. I will follow her in [facility] and make appointment with Dr. Tommy Medal when she is discharged. [name] RN

## 2014-12-06 NOTE — Progress Notes (Addendum)
PULMONARY / CRITICAL CARE MEDICINE   Name: Connie Wade MRN: [id number] DOB: Jun 08, 1955    ADMISSION DATE:  12/04/2014 CONSULTATION DATE:  12/04/14  REFERRING MD :  Ditty  CHIEF COMPLAINT:  Headache  INITIAL PRESENTATION:  59 y.o. F presented to [facility] ED 11/8 with worse headache of her life.  Found to have [facility] for which she was transferred to [facility] for neurosurgical consultation.  PCCM called for assistance with medical management and for placement of CVL.  She will likely undergo surgical intervention 11/9 with Dr. Kathyrn Sheriff..   STUDIES:  CTA head 11/8 [facility] with 8.6 x 6.28mm right PCOM artery aneurysm.  SIGNIFICANT EVENTS: 11/8 - admitted with [facility]. 11/9 - PCOM coil & extubated PACU  LINES: Left IJ CVC 11/8>> Left Radial Art 11/8>>11/10  SUBJECTIVE: Denies any significant headache. No dyspnea or cough.  REVIEW OF SYSTEMS: No chest pain or pressure. Denies any nausea or vomiting.  VITAL SIGNS: Temp:  [97.5 F (36.4 C)-99 F (37.2 C)] 97.5 F (36.4 C) (11/10 1132) Pulse Rate:  [53-88] 88 (11/10 1400) Resp:  [10-32] 23 (11/10 1400) BP: (81-169)/(36-150) 168/96 mmHg (11/10 1400) SpO2:  [98 %-100 %] 100 % (11/10 1400) Arterial Line BP: (142-175)/(65-79) 175/79 mmHg (11/10 0800) HEMODYNAMICS: CVP:  [9 mmHg-19 mmHg] 19 mmHg VENTILATOR SETTINGS:   INTAKE / OUTPUT: Intake/Output      11/09 0701 - 11/10 0700 11/10 0701 - 11/11 0700   P.O. 720    I.V. (mL/kg) 2200 (44.4) 100 (2)   IV Piggyback 50    Total Intake(mL/kg) 2970 (60) 100 (2)   Urine (mL/kg/hr) 1000 (0.8) 650 (1.3)   Stool  1 (0)   Total Output 1000 651   Net +1970 -551        Stool Occurrence  1 x     PHYSICAL EXAMINATION: General:  Awake. Alert. Sitting up at bedside preparing to ambulate to the bathroom.  Integument:  Warm & dry. No rash on exposed skin.  HEENT:  Moist mucus membranes. No oral ulcers.  Cardiovascular:  Regular rate & rhythm. No edema.  Pulmonary:  Clear to auscultation bilaterally. No  accessory muscle use on room air. Abdomen: Soft. Normal bowel sounds. Nontender. Musculoskeletal:  Normal bulk and tone. No joint deformity or effusion appreciated. Neurological:  Oriented person, place, and time. Ambulating normally. Grossly nonfocal.  LABS:  CBC  Recent Labs Lab 12/04/14 1607 12/04/14 2325 12/06/14 0530  WBC  --  7.0 19.9*  HGB 13.9 11.7* 9.5*  HCT 41.0 34.8* 27.8*  PLT  --  422* 341   Coag's  Recent Labs Lab 12/04/14 2325  APTT 30  INR 1.04   BMET  Recent Labs Lab 12/04/14 1607 12/04/14 2325 12/06/14 0530  NA 142 139 137  K 3.4* 4.0 4.0  CL 106 107 110  CO2  --  23 22  BUN 5* <5* 9  CREATININE 0.60 0.60 0.64  GLUCOSE 80 120* 131*   Electrolytes  Recent Labs Lab 12/04/14 2325 12/06/14 0530  CALCIUM 9.2 8.7*  MG  --  1.8  PHOS  --  3.2   Sepsis Markers No results for input(s): LATICACIDVEN, PROCALCITON, O2SATVEN in the last 168 hours. ABG No results for input(s): PHART, PCO2ART, PO2ART in the last 168 hours. Liver Enzymes  Recent Labs Lab 12/04/14 2325 12/06/14 0530  AST 20  --   ALT 16  --   ALKPHOS 65  --   BILITOT 0.6  --   ALBUMIN 3.6 3.6  Cardiac Enzymes No results for input(s): TROPONINI, PROBNP in the last 168 hours. Glucose No results for input(s): GLUCAP in the last 168 hours.  Imaging No results found.   ASSESSMENT / PLAN:  Very pleasant 60 year old female with known history of hypertension as well as HIV infection presenting with a headache secondary to subarachnoid hemorrhage from a PCOM intracerebral aneurysm. Status post coiling of PCOM with successful expiration in the PACU. Continuing central venous access at this time. Remainder of lines and Foley catheter being discontinued. Patient is recovering quite well from her procedure.   1. SAH from PCOM Intracerebral Aneurysm:  S/P coiling by Neurosurgery. Management per Neurosurgery. Fentanyl IV & Tylenol for pain as needed.  2. Hypertension: Continue  lisinopril 10 mg by mouth daily. Goal systolic blood pressure per Neurosurgery. IV Labetalol as needed to maintain goal SBP <140. 3. HIV Infection:  Continuing Descovy & Edurant outpatient medications. 4. Anemia: No signs of active bleeding. Continuing to trend hemoglobin daily with CBC. 5. Hypokalemia: Resolved. Continuing to trend electrolytes and renal function daily. 6. Prophylaxis:  SCDs. 7. Diet:  Regular diet. 8. Chronic Tobacco Use:  Continuing outpatient albuterol via nebulizer as needed. Recommended tobacco cessation education prior to discharge.   I spent a total of 30 minutes of critical care time today caring for the patient & reviewing the patient's electronic medical record.  Sonia Baller Ashok Cordia, M.D. [facility] Pulmonary & Critical Care Pager:  [phone] After 3pm or if no response, call [phone]  12/06/2014, 4:53 PM

## 2014-12-07 ENCOUNTER — Inpatient Hospital Stay (HOSPITAL_COMMUNITY): Payer: Self-pay

## 2014-12-07 DIAGNOSIS — I609 Nontraumatic subarachnoid hemorrhage, unspecified: Secondary | ICD-10-CM

## 2014-12-07 LAB — CBC WITH DIFFERENTIAL/PLATELET
BASOS ABS: 0 10*3/uL (ref 0.0–0.1)
BASOS PCT: 0 %
EOS ABS: 0 10*3/uL (ref 0.0–0.7)
EOS PCT: 0 %
HCT: 27.4 % — ABNORMAL LOW (ref 36.0–46.0)
HEMOGLOBIN: 9.2 g/dL — AB (ref 12.0–15.0)
LYMPHS ABS: 0.9 10*3/uL (ref 0.7–4.0)
Lymphocytes Relative: 8 %
MCH: 24.7 pg — AB (ref 26.0–34.0)
MCHC: 33.6 g/dL (ref 30.0–36.0)
MCV: 73.7 fL — ABNORMAL LOW (ref 78.0–100.0)
Monocytes Absolute: 0.5 10*3/uL (ref 0.1–1.0)
Monocytes Relative: 4 %
NEUTROS PCT: 88 %
Neutro Abs: 9.7 10*3/uL — ABNORMAL HIGH (ref 1.7–7.7)
PLATELETS: 326 10*3/uL (ref 150–400)
RBC: 3.72 MIL/uL — AB (ref 3.87–5.11)
RDW: 17 % — ABNORMAL HIGH (ref 11.5–15.5)
WBC: 11.1 10*3/uL — AB (ref 4.0–10.5)

## 2014-12-07 LAB — RENAL FUNCTION PANEL
ALBUMIN: 3.5 g/dL (ref 3.5–5.0)
Anion gap: 6 (ref 5–15)
BUN: 10 mg/dL (ref 6–20)
CALCIUM: 8.8 mg/dL — AB (ref 8.9–10.3)
CO2: 24 mmol/L (ref 22–32)
CREATININE: 0.57 mg/dL (ref 0.44–1.00)
Chloride: 110 mmol/L (ref 101–111)
Glucose, Bld: 116 mg/dL — ABNORMAL HIGH (ref 65–99)
PHOSPHORUS: 3.3 mg/dL (ref 2.5–4.6)
Potassium: 3.8 mmol/L (ref 3.5–5.1)
SODIUM: 140 mmol/L (ref 135–145)

## 2014-12-07 LAB — MAGNESIUM: MAGNESIUM: 2 mg/dL (ref 1.7–2.4)

## 2014-12-07 MED ORDER — MENTHOL 3 MG MT LOZG
1.0000 | LOZENGE | OROMUCOSAL | Status: DC | PRN
  Administered 2014-12-07: 3 mg via ORAL
  Filled 2014-12-07: qty 9

## 2014-12-07 MED ORDER — NIMODIPINE 30 MG PO CAPS
60.0000 mg | ORAL_CAPSULE | ORAL | Status: DC
  Administered 2014-12-07 – 2014-12-14 (×42): 60 mg via ORAL
  Filled 2014-12-07 (×41): qty 2

## 2014-12-07 NOTE — Clinical Documentation Improvement (Signed)
Neurosurgery or Critical Care Medicine  (please document your query response in the progress notes and discharge summary, not on the BPA itself.)  To assist with accurate code assignment, please clarify and document if the patient has:   - AIDS, on current ARV treatment  (please include any applicable qualifiers as noted below.)   - HIV positive on current ARV treatment, with no history of any qualifying condition(s) noted below.    The CDC defines AIDS as present in an HIV positive patient who has or has had any of the following:  - Current or prior CD4+ T-lymphocyte count <200 cells/uL  - Current or prior CD4+ T-lymphocyte count < 14% of total lymphocytes    - Current or prior diagnosis of an AIDS-defining condition    ([email or web address])   Please exercise your independent, professional judgment when responding. A specific answer is not anticipated or expected.   Thank You,  Erling Conte  RN BSN CCDS [phone] Health Information Management [place]

## 2014-12-07 NOTE — Progress Notes (Signed)
PULMONARY / CRITICAL CARE MEDICINE   Name: Connie Wade MRN: [id number] DOB: February 07, 1955    ADMISSION DATE:  12/04/2014 CONSULTATION DATE:  12/04/14  REFERRING MD :  Ditty  CHIEF COMPLAINT:  Headache  INITIAL PRESENTATION:  59 y.o. F presented to [facility] ED 11/8 with worse headache of her life.  Found to have [facility] for which she was transferred to [facility] for neurosurgical consultation.  PCCM called for assistance with medical management and for placement of CVL.  She will likely undergo surgical intervention 11/9 with Dr. Kathyrn Sheriff..   STUDIES:  CTA head 11/8 [facility] with 8.6 x 6.48mm right PCOM artery aneurysm.  SIGNIFICANT EVENTS: 11/8 - admitted with [facility]. 11/9 - PCOM coil & extubated PACU  LINES: Left IJ CVC 11/8>> Left Radial Art 11/8>>11/10  SUBJECTIVE: No nausea or vomiting overnight. No chest pain or pressure. Minimal headache.  REVIEW OF SYSTEMS: No subjective fever, chills, or sweats. No dyspnea or cough.  VITAL SIGNS: Temp:  [97.1 F (36.2 C)-98.7 F (37.1 C)] 97.1 F (36.2 C) (11/11 0824) Pulse Rate:  [53-88] 72 (11/11 1200) Resp:  [12-23] 21 (11/11 1200) BP: (90-190)/(47-138) 90/47 mmHg (11/11 1200) SpO2:  [96 %-100 %] 99 % (11/11 1200) HEMODYNAMICS:   VENTILATOR SETTINGS:   INTAKE / OUTPUT: Intake/Output      11/10 0701 - 11/11 0700 11/11 0701 - 11/12 0700   P.O. 840    I.V. (mL/kg) 2400 (48.5) 500 (10.1)   IV Piggyback     Total Intake(mL/kg) 3240 (65.5) 500 (10.1)   Urine (mL/kg/hr) 660 (0.6) 5 (0)   Stool 1 (0)    Total Output 661 5   Net +2579 +495        Urine Occurrence 1 x    Stool Occurrence 1 x      PHYSICAL EXAMINATION: General:  Awake. Alert. Sitting up in her chair at bedside playing a game on her phone.  Integument:  Warm & dry. No rash on exposed skin. Left IJ catheter remains in place. HEENT:  Moist mucus membranes. No oral ulcers.  Cardiovascular:  Regular rate & rhythm. No edema.  Pulmonary:  Normal work of breathing on room air. Clear  bilaterally to auscultation. Abdomen: Soft. Normal bowel sounds. Nontender. Musculoskeletal:  Normal bulk and tone. Strength 5/5 & symmetric. Neurological:  Oriented 3. Grossly nonfocal. No meningismus.  LABS:  CBC  Recent Labs Lab 12/04/14 2325 12/06/14 0530 12/07/14 0530  WBC 7.0 19.9* 11.1*  HGB 11.7* 9.5* 9.2*  HCT 34.8* 27.8* 27.4*  PLT 422* 341 326   Coag's  Recent Labs Lab 12/04/14 2325  APTT 30  INR 1.04   BMET  Recent Labs Lab 12/04/14 2325 12/06/14 0530 12/07/14 0530  NA 139 137 140  K 4.0 4.0 3.8  CL 107 110 110  CO2 23 22 24   BUN <5* 9 10  CREATININE 0.60 0.64 0.57  GLUCOSE 120* 131* 116*   Electrolytes  Recent Labs Lab 12/04/14 2325 12/06/14 0530 12/07/14 0530  CALCIUM 9.2 8.7* 8.8*  MG  --  1.8 2.0  PHOS  --  3.2 3.3   Sepsis Markers No results for input(s): LATICACIDVEN, PROCALCITON, O2SATVEN in the last 168 hours. ABG No results for input(s): PHART, PCO2ART, PO2ART in the last 168 hours. Liver Enzymes  Recent Labs Lab 12/04/14 2325 12/06/14 0530 12/07/14 0530  AST 20  --   --   ALT 16  --   --   ALKPHOS 65  --   --  BILITOT 0.6  --   --   ALBUMIN 3.6 3.6 3.5   Cardiac Enzymes No results for input(s): TROPONINI, PROBNP in the last 168 hours. Glucose No results for input(s): GLUCAP in the last 168 hours.  Imaging No results found.   ASSESSMENT / PLAN:  Very pleasant 59 year old female with known history of hypertension as well as HIV infection presenting with a headache secondary to subarachnoid hemorrhage from a PCOM intracerebral aneurysm. Status post coiling of PCOM with successful expiration in the PACU. Patient continuously remains stable. She has no complaints at this time. Blood pressure seems to be recently controlled. Currently she is on treatment for her underlying HIV and I have discontinued Pepcid given the potential contraindication with her medications.  1. SAH from PCOM Intracerebral Aneurysm:  S/P coiling  by Neurosurgery. Management per Neurosurgery. Decadron IV every 6 hours & Nimodipine. Fentanyl IV, Tylenol #3, & Tylenol for pain as needed.  2. Hypertension: Controlled. Lisinopril 10 mg by mouth daily. Goal systolic blood pressure per Neurosurgery. IV Labetalol as needed to maintain goal SBP. 3. HIV Infection:  Continuing Descovy & Edurant outpatient medications. 4. Anemia: Hgb stable. No signs of active bleeding. Continuing to trend hemoglobin daily with CBC. 5. Hypokalemia: Resolved. Continuing to trend electrolytes and renal function daily. 6. Leukocytosis:  Resolved. Likely stress response. 7. Prophylaxis:  SCDs. 8. Diet:  Regular diet. 9. Chronic Tobacco Use:  Continuing outpatient albuterol via nebulizer as needed. Recommended tobacco cessation education prior to discharge.  As the patient continues to remain clinically stable I will sign off. Please contact our service if she has any clinical deterioration or we could be of any further assistance in her treatment.  Sonia Baller Ashok Cordia, M.D. [facility] Pager:  [phone] After 3pm or if no response, call [phone]  12/07/2014, 1:56 PM

## 2014-12-07 NOTE — Progress Notes (Signed)
Transcranial Doppler  Date POD PCO2 HCT BP  MCA ACA PCA OPHT SIPH VERT Basilar  12/06/14 MS     Right  Left   62  67   *  -43   *  *   20  27   *  *   -34  *   *      12/07/14 JE     Right  Left   61  61   *  -29   *  25   15  24    51  39   -35  *   -35           Right  Left                                             Right  Left                                             Right  Left                                            Right  Left                                            Right  Left                                        MCA = Middle Cerebral Artery      OPHT = Opthalmic Artery     BASILAR = Basilar Artery   ACA = Anterior Cerebral Artery     SIPH = Carotid Siphon PCA = Posterior Cerebral Artery   VERT = Verterbral Artery                   Normal MCA = 62+\-12 ACA = 50+\-12 PCA = 42+\-23   * Not insonated

## 2014-12-07 NOTE — Progress Notes (Signed)
Pt seen and examined. No issues overnight. Pt has no complaints today.  EXAM: Temp:  [97.1 F (36.2 C)-98.7 F (37.1 C)] 98.3 F (36.8 C) (11/11 1543) Pulse Rate:  [53-81] 57 (11/11 1600) Resp:  [12-21] 18 (11/11 1600) BP: (90-190)/(47-138) 128/88 mmHg (11/11 1600) SpO2:  [96 %-100 %] 100 % (11/11 1600) Intake/Output      11/10 0701 - 11/11 0700 11/11 0701 - 11/12 0700   P.O. 840    I.V. (mL/kg) 2400 (48.5) 900 (18.2)   IV Piggyback     Total Intake(mL/kg) 3240 (65.5) 900 (18.2)   Urine (mL/kg/hr) 660 (0.6)    Stool 1 (0)    Total Output 661     Net +2579 +900        Urine Occurrence 1 x 6 x   Stool Occurrence 1 x 1 x    Awake, alert, oriented Speech fluent CN intact Good strength throughout  LABS: Lab Results  Component Value Date   CREATININE 0.57 12/07/2014   BUN 10 12/07/2014   NA 140 12/07/2014   K 3.8 12/07/2014   CL 110 12/07/2014   CO2 24 12/07/2014   Lab Results  Component Value Date   WBC 11.1* 12/07/2014   HGB 9.2* 12/07/2014   HCT 27.4* 12/07/2014   MCV 73.7* 12/07/2014   PLT 326 12/07/2014    IMPRESSION: - 59 y.o. female [place] d# 3 s/p right Pcom coiling, doing well  PLAN: - Cont spasm watch - Up ad lib

## 2014-12-07 NOTE — Progress Notes (Signed)
Thanks Connie Wade! 

## 2014-12-07 NOTE — Care Management Note (Signed)
Case Management Note  Patient Details  Name: Connie Wade MRN: [id number] Date of Birth: 1955/06/14  Subjective/Objective:   Pt admitted on 12/04/14 with [facility] s/p coil embolization.  PTA, pt from home; independent of ADLS.  Pt currently on Nimotop therapy; she is uninsured.                     Action/Plan: Met with pt and her sisters, at bedside to discuss dc planning.  Pt states she was supposed to be taking Lisinopril PTA, but it made her feel "weird", and she couldn't afford it, even though it is a [employer] generic.  Pt states she knows she is going to have to do a better job being compliant with her medications.  Pt is eligible for medication assistance at dc through [facility] program.  This will allow pt to receive all dc Rx, including Nimotop, for $3.00/piece. (one time, 34 day supply)   Pt states she knows she will have to go on more BP meds, per her physician's conversations, and she is worried about the cost.  Would appreciate MD to consider using [employer] $4 generics if at all possible, as pt states she should be able to afford them.   Pt follows up at [facility]; would recommend follow up appt to be made at dc.  Instructed pt that she would need close PCP follow up of HTN, and that [facility] may be able to offer assistance with medications if she has trouble affording them.  Will follow up as pt progresses.    Expected Discharge Date:                  Expected Discharge Plan:  Home/Self Care  In-House Referral:     Discharge planning Services  CM Consult, [place] Program, Medication Assistance  Post Acute Care Choice:    Choice offered to:     DME Arranged:    DME Agency:     HH Arranged:    HH Agency:     Status of Service:  In process, will continue to follow  Medicare Important Message Given:    Date Medicare IM Given:    Medicare IM give by:    Date Additional Medicare IM Given:    Additional Medicare Important Message give by:     If  discussed at [place] of Stay Meetings, dates discussed:    Additional Comments:  Reinaldo Raddle, RN, BSN  Trauma/Neuro ICU Case Manager [phone]

## 2014-12-08 LAB — RENAL FUNCTION PANEL
Albumin: 3.8 g/dL (ref 3.5–5.0)
Anion gap: 7 (ref 5–15)
BUN: 8 mg/dL (ref 6–20)
CALCIUM: 9 mg/dL (ref 8.9–10.3)
CO2: 28 mmol/L (ref 22–32)
CREATININE: 0.53 mg/dL (ref 0.44–1.00)
Chloride: 105 mmol/L (ref 101–111)
GFR calc non Af Amer: >60 mL/min (ref >60)
GLUCOSE: 102 mg/dL — AB (ref 65–99)
Phosphorus: 3.4 mg/dL (ref 2.5–4.6)
Potassium: 3.5 mmol/L (ref 3.5–5.1)
SODIUM: 140 mmol/L (ref 135–145)

## 2014-12-08 LAB — CBC WITH DIFFERENTIAL/PLATELET
BASOS PCT: 0 %
Basophils Absolute: 0 10*3/uL (ref 0.0–0.1)
EOS ABS: 0 10*3/uL (ref 0.0–0.7)
Eosinophils Relative: 0 %
HCT: 29.3 % — ABNORMAL LOW (ref 36.0–46.0)
HEMOGLOBIN: 9.9 g/dL — AB (ref 12.0–15.0)
LYMPHS PCT: 17 %
Lymphs Abs: 1.3 10*3/uL (ref 0.7–4.0)
MCH: 24.8 pg — ABNORMAL LOW (ref 26.0–34.0)
MCHC: 33.8 g/dL (ref 30.0–36.0)
MCV: 73.4 fL — ABNORMAL LOW (ref 78.0–100.0)
MONOS PCT: 8 %
Monocytes Absolute: 0.6 10*3/uL (ref 0.1–1.0)
NEUTROS ABS: 5.9 10*3/uL (ref 1.7–7.7)
Neutrophils Relative %: 75 %
Platelets: 352 10*3/uL (ref 150–400)
RBC: 3.99 MIL/uL (ref 3.87–5.11)
RDW: 16.9 % — AB (ref 11.5–15.5)
WBC: 7.8 10*3/uL (ref 4.0–10.5)

## 2014-12-08 LAB — MAGNESIUM: MAGNESIUM: 1.9 mg/dL (ref 1.7–2.4)

## 2014-12-08 MED ORDER — LABETALOL HCL 5 MG/ML IV SOLN: 5.0000 mg | INTRAVENOUS | Status: DC | PRN

## 2014-12-08 NOTE — Progress Notes (Signed)
Patient ID: Connie Wade, female   DOB: [birth year], 59 y.o.   MRN: [id number] BP 135/66 mmHg  Pulse 59  Temp(Src) 97.2 F (36.2 C) (Axillary)  Resp 13  Ht 5\' 2"  (1.575 m)  Wt 49.5 kg (109 lb 2 oz)  BMI 19.95 kg/m2  SpO2 100% Alert and oriented , speech is clear and fluent Following all commands Moving all extremities well Doing very well.

## 2014-12-09 LAB — CBC WITH DIFFERENTIAL/PLATELET
Basophils Absolute: 0 10*3/uL (ref 0.0–0.1)
Basophils Relative: 0 %
Eosinophils Absolute: 0 10*3/uL (ref 0.0–0.7)
Eosinophils Relative: 0 %
HEMATOCRIT: 31.2 % — AB (ref 36.0–46.0)
Hemoglobin: 10.4 g/dL — ABNORMAL LOW (ref 12.0–15.0)
LYMPHS ABS: 1.1 10*3/uL (ref 0.7–4.0)
Lymphocytes Relative: 14 %
MCH: 24.8 pg — ABNORMAL LOW (ref 26.0–34.0)
MCHC: 33.3 g/dL (ref 30.0–36.0)
MCV: 74.3 fL — AB (ref 78.0–100.0)
MONO ABS: 0.4 10*3/uL (ref 0.1–1.0)
MONOS PCT: 6 %
NEUTROS PCT: 80 %
Neutro Abs: 6.2 10*3/uL (ref 1.7–7.7)
Platelets: 390 10*3/uL (ref 150–400)
RBC: 4.2 MIL/uL (ref 3.87–5.11)
RDW: 17.2 % — AB (ref 11.5–15.5)
WBC: 7.7 10*3/uL (ref 4.0–10.5)

## 2014-12-09 LAB — RENAL FUNCTION PANEL
ANION GAP: 9 (ref 5–15)
Albumin: 3.7 g/dL (ref 3.5–5.0)
BUN: 10 mg/dL (ref 6–20)
CO2: 27 mmol/L (ref 22–32)
Calcium: 9.2 mg/dL (ref 8.9–10.3)
Chloride: 103 mmol/L (ref 101–111)
Creatinine, Ser: 0.56 mg/dL (ref 0.44–1.00)
GFR calc Af Amer: >60 mL/min (ref >60)
GFR calc non Af Amer: >60 mL/min (ref >60)
GLUCOSE: 101 mg/dL — AB (ref 65–99)
POTASSIUM: 3.6 mmol/L (ref 3.5–5.1)
Phosphorus: 3.8 mg/dL (ref 2.5–4.6)
Sodium: 139 mmol/L (ref 135–145)

## 2014-12-09 LAB — MAGNESIUM: Magnesium: 1.9 mg/dL (ref 1.7–2.4)

## 2014-12-09 NOTE — Progress Notes (Signed)
Patient ID: Connie Wade, female   DOB: 1956/01/03, 59 y.o.   MRN: [id number] BP 175/85 mmHg  Pulse 61  Temp(Src) 98.7 F (37.1 C) (Oral)  Resp 15  Ht 5\' 2"  (1.575 m)  Wt 49.5 kg (109 lb 2 oz)  BMI 19.95 kg/m2  SpO2 100% Alert and oriented x 4 Perrl, full eom No drift Moving all extremities Ambulating in the unit.  Doing well

## 2014-12-10 ENCOUNTER — Ambulatory Visit: Payer: Self-pay | Admitting: Infectious Disease

## 2014-12-10 ENCOUNTER — Inpatient Hospital Stay (HOSPITAL_COMMUNITY): Payer: Self-pay

## 2014-12-10 DIAGNOSIS — I609 Nontraumatic subarachnoid hemorrhage, unspecified: Secondary | ICD-10-CM

## 2014-12-10 LAB — RENAL FUNCTION PANEL
ANION GAP: 7 (ref 5–15)
Albumin: 3.4 g/dL — ABNORMAL LOW (ref 3.5–5.0)
BUN: 13 mg/dL (ref 6–20)
CALCIUM: 8.9 mg/dL (ref 8.9–10.3)
CO2: 28 mmol/L (ref 22–32)
CREATININE: 0.57 mg/dL (ref 0.44–1.00)
Chloride: 104 mmol/L (ref 101–111)
Glucose, Bld: 93 mg/dL (ref 65–99)
PHOSPHORUS: 4.3 mg/dL (ref 2.5–4.6)
Potassium: 3.7 mmol/L (ref 3.5–5.1)
SODIUM: 139 mmol/L (ref 135–145)

## 2014-12-10 LAB — CBC WITH DIFFERENTIAL/PLATELET
BASOS PCT: 0 %
Basophils Absolute: 0 10*3/uL (ref 0.0–0.1)
EOS ABS: 0 10*3/uL (ref 0.0–0.7)
Eosinophils Relative: 0 %
HEMATOCRIT: 32.1 % — AB (ref 36.0–46.0)
HEMOGLOBIN: 10.8 g/dL — AB (ref 12.0–15.0)
LYMPHS ABS: 1.4 10*3/uL (ref 0.7–4.0)
Lymphocytes Relative: 17 %
MCH: 24.8 pg — ABNORMAL LOW (ref 26.0–34.0)
MCHC: 33.6 g/dL (ref 30.0–36.0)
MCV: 73.8 fL — ABNORMAL LOW (ref 78.0–100.0)
MONOS PCT: 7 %
Monocytes Absolute: 0.5 10*3/uL (ref 0.1–1.0)
NEUTROS ABS: 6 10*3/uL (ref 1.7–7.7)
NEUTROS PCT: 76 %
Platelets: 399 10*3/uL (ref 150–400)
RBC: 4.35 MIL/uL (ref 3.87–5.11)
RDW: 17.1 % — ABNORMAL HIGH (ref 11.5–15.5)
WBC: 7.9 10*3/uL (ref 4.0–10.5)

## 2014-12-10 LAB — MAGNESIUM: MAGNESIUM: 1.9 mg/dL (ref 1.7–2.4)

## 2014-12-10 NOTE — Progress Notes (Signed)
Pt seen and examined. No issues overnight. Pt has no complaints currently.  EXAM: Temp:  [97.8 F (36.6 C)-98.7 F (37.1 C)] 98.1 F (36.7 C) (11/14 0400) Pulse Rate:  [51-79] 56 (11/14 0700) Resp:  [10-32] 17 (11/14 0700) BP: (96-175)/(58-133) 158/92 mmHg (11/14 0700) SpO2:  [99 %-100 %] 100 % (11/14 0700) Intake/Output      11/13 0701 - 11/14 0700 11/14 0701 - 11/15 0700   P.O.     I.V. (mL/kg) 2400 (48.5)    Total Intake(mL/kg) 2400 (48.5)    Net +2400          Urine Occurrence 11 x     Awake, alert, oriented Speech fluent CN intact Good strength  LABS: Lab Results  Component Value Date   CREATININE 0.57 12/10/2014   BUN 13 12/10/2014   NA 139 12/10/2014   K 3.7 12/10/2014   CL 104 12/10/2014   CO2 28 12/10/2014   Lab Results  Component Value Date   WBC 7.9 12/10/2014   HGB 10.8* 12/10/2014   HCT 32.1* 12/10/2014   MCV 73.8* 12/10/2014   PLT 399 12/10/2014    IMPRESSION: - 59 y.o. female [place] d# 7 s/p right Pcom coiling, neurologically intact  PLAN: - Cont observation - Up ad lib

## 2014-12-10 NOTE — Progress Notes (Signed)
Transcranial Doppler  Date POD PCO2 HCT BP  MCA ACA PCA OPHT SIPH VERT Basilar  12/06/14 MS     Right  Left   62  67   *  -43   *  *   20  27   *  *   -34  *   *      12/07/14 JE     Right  Left   61  61   *  -29   *  25   15  24    51  39   -35  *   -35      12/10/14 VS     Right  Left   23  *   -39  -29   *  *   20  16   29  21    -39  -34   -37            Right  Left                                             Right  Left                                            Right  Left                                            Right  Left                                        MCA = Middle Cerebral Artery      OPHT = Opthalmic Artery     BASILAR = Basilar Artery   ACA = Anterior Cerebral Artery     SIPH = Carotid Siphon PCA = Posterior Cerebral Artery   VERT = Verterbral Artery                   Normal MCA = 62+\-12 ACA = 50+\-12 PCA = 42+\-23   * Not insonated  Technically difficult due to limited small windows  [facility], RVS 12/10/2014 1450

## 2014-12-11 LAB — CBC WITH DIFFERENTIAL/PLATELET
BASOS PCT: 0 %
Basophils Absolute: 0 10*3/uL (ref 0.0–0.1)
EOS ABS: 0 10*3/uL (ref 0.0–0.7)
Eosinophils Relative: 0 %
HCT: 31.9 % — ABNORMAL LOW (ref 36.0–46.0)
HEMOGLOBIN: 10.7 g/dL — AB (ref 12.0–15.0)
Lymphocytes Relative: 17 %
Lymphs Abs: 1.5 10*3/uL (ref 0.7–4.0)
MCH: 24.7 pg — ABNORMAL LOW (ref 26.0–34.0)
MCHC: 33.5 g/dL (ref 30.0–36.0)
MCV: 73.5 fL — ABNORMAL LOW (ref 78.0–100.0)
Monocytes Absolute: 0.6 10*3/uL (ref 0.1–1.0)
Monocytes Relative: 7 %
NEUTROS PCT: 76 %
Neutro Abs: 6.6 10*3/uL (ref 1.7–7.7)
Platelets: 422 10*3/uL — ABNORMAL HIGH (ref 150–400)
RBC: 4.34 MIL/uL (ref 3.87–5.11)
RDW: 17.1 % — ABNORMAL HIGH (ref 11.5–15.5)
WBC: 8.7 10*3/uL (ref 4.0–10.5)

## 2014-12-11 LAB — RENAL FUNCTION PANEL
Albumin: 3.3 g/dL — ABNORMAL LOW (ref 3.5–5.0)
Anion gap: 7 (ref 5–15)
BUN: 12 mg/dL (ref 6–20)
CALCIUM: 8.9 mg/dL (ref 8.9–10.3)
CO2: 27 mmol/L (ref 22–32)
CREATININE: 0.57 mg/dL (ref 0.44–1.00)
Chloride: 105 mmol/L (ref 101–111)
GFR calc non Af Amer: >60 mL/min (ref >60)
Glucose, Bld: 93 mg/dL (ref 65–99)
Phosphorus: 4.4 mg/dL (ref 2.5–4.6)
Potassium: 3.9 mmol/L (ref 3.5–5.1)
SODIUM: 139 mmol/L (ref 135–145)

## 2014-12-11 LAB — MAGNESIUM: MAGNESIUM: 2 mg/dL (ref 1.7–2.4)

## 2014-12-11 NOTE — Progress Notes (Signed)
Pt remains in ICU s/p SAH Rt PCOM coiling on Nimotop therapy.  Up in room without difficulty.   No HH needs anticipated.  Will provide medication assistance at dc, as pt uninsured.  Will follow.    Reinaldo Raddle, RN, BSN  Trauma/Neuro ICU Case Manager [phone]

## 2014-12-11 NOTE — Progress Notes (Signed)
Pt seen and examined. No issues overnight. Pt has no complaints currently.  EXAM: Temp:  [97.9 F (36.6 C)-98.7 F (37.1 C)] 98.7 F (37.1 C) (11/15 1150) Pulse Rate:  [56-85] 68 (11/15 1200) Resp:  [11-26] 14 (11/15 1200) BP: (113-199)/(56-100) 139/75 mmHg (11/15 1200) SpO2:  [95 %-100 %] 98 % (11/15 1200) Intake/Output      11/14 0701 - 11/15 0700 11/15 0701 - 11/16 0700   P.O. 1980 600   I.V. (mL/kg) 2400 (48.5) 500 (10.1)   Total Intake(mL/kg) 4380 (88.5) 1100 (22.2)   Net +4380 +1100        Urine Occurrence 12 x 3 x   Stool Occurrence  1 x    Awake, alert, oriented Speech fluent CN intact Good strength  LABS: Lab Results  Component Value Date   CREATININE 0.57 12/11/2014   BUN 12 12/11/2014   NA 139 12/11/2014   K 3.9 12/11/2014   CL 105 12/11/2014   CO2 27 12/11/2014   Lab Results  Component Value Date   WBC 8.7 12/11/2014   HGB 10.7* 12/11/2014   HCT 31.9* 12/11/2014   MCV 73.5* 12/11/2014   PLT 422* 12/11/2014    IMPRESSION: - 59 y.o. female [place] d# 8 s/p right Pcom coiling, neurologically intact  PLAN: - Cont observation

## 2014-12-12 ENCOUNTER — Inpatient Hospital Stay (HOSPITAL_COMMUNITY): Payer: Self-pay

## 2014-12-12 DIAGNOSIS — I609 Nontraumatic subarachnoid hemorrhage, unspecified: Secondary | ICD-10-CM

## 2014-12-12 LAB — CBC WITH DIFFERENTIAL/PLATELET
BASOS ABS: 0 10*3/uL (ref 0.0–0.1)
Basophils Relative: 0 %
Eosinophils Absolute: 0 10*3/uL (ref 0.0–0.7)
Eosinophils Relative: 0 %
HEMATOCRIT: 32.4 % — AB (ref 36.0–46.0)
Hemoglobin: 10.8 g/dL — ABNORMAL LOW (ref 12.0–15.0)
LYMPHS ABS: 1.1 10*3/uL (ref 0.7–4.0)
LYMPHS PCT: 11 %
MCH: 24.8 pg — ABNORMAL LOW (ref 26.0–34.0)
MCHC: 33.3 g/dL (ref 30.0–36.0)
MCV: 74.5 fL — AB (ref 78.0–100.0)
MONO ABS: 0.6 10*3/uL (ref 0.1–1.0)
MONOS PCT: 6 %
NEUTROS ABS: 8.5 10*3/uL — AB (ref 1.7–7.7)
Neutrophils Relative %: 83 %
Platelets: 444 10*3/uL — ABNORMAL HIGH (ref 150–400)
RBC: 4.35 MIL/uL (ref 3.87–5.11)
RDW: 17.2 % — AB (ref 11.5–15.5)
WBC: 10.2 10*3/uL (ref 4.0–10.5)

## 2014-12-12 LAB — RENAL FUNCTION PANEL
ALBUMIN: 3.1 g/dL — AB (ref 3.5–5.0)
ANION GAP: 6 (ref 5–15)
BUN: 9 mg/dL (ref 6–20)
CO2: 27 mmol/L (ref 22–32)
Calcium: 8.5 mg/dL — ABNORMAL LOW (ref 8.9–10.3)
Chloride: 108 mmol/L (ref 101–111)
Creatinine, Ser: 0.59 mg/dL (ref 0.44–1.00)
GFR calc Af Amer: >60 mL/min (ref >60)
GFR calc non Af Amer: >60 mL/min (ref >60)
GLUCOSE: 102 mg/dL — AB (ref 65–99)
PHOSPHORUS: 3.7 mg/dL (ref 2.5–4.6)
POTASSIUM: 3.8 mmol/L (ref 3.5–5.1)
Sodium: 141 mmol/L (ref 135–145)

## 2014-12-12 LAB — MAGNESIUM: Magnesium: 1.9 mg/dL (ref 1.7–2.4)

## 2014-12-12 NOTE — Progress Notes (Signed)
Pt seen and examined. No issues overnight. Pt has no complaints currently.  EXAM: Temp:  [98.2 F (36.8 C)-98.7 F (37.1 C)] 98.4 F (36.9 C) (11/16 1658) Pulse Rate:  [59-76] 73 (11/16 1900) Resp:  [10-32] 19 (11/16 1900) BP: (108-166)/(64-128) 163/83 mmHg (11/16 1900) SpO2:  [91 %-100 %] 100 % (11/16 1900) Intake/Output      11/16 0701 - 11/17 0700   P.O. 960   I.V. (mL/kg) 1200 (24.2)   Total Intake(mL/kg) 2160 (43.6)   Urine (mL/kg/hr) 200 (0.3)   Total Output 200   Net +1960       Urine Occurrence 2 x    Awake, alert, oriented Speech fluent CN intact Good strength  LABS: Lab Results  Component Value Date   CREATININE 0.59 12/12/2014   BUN 9 12/12/2014   NA 141 12/12/2014   K 3.8 12/12/2014   CL 108 12/12/2014   CO2 27 12/12/2014   Lab Results  Component Value Date   WBC 10.2 12/12/2014   HGB 10.8* 12/12/2014   HCT 32.4* 12/12/2014   MCV 74.5* 12/12/2014   PLT 444* 12/12/2014    IMPRESSION: - 59 y.o. female [place] d# 9 s/p right Pcom coiling, neurologically intact  PLAN: - Cont observation - Will consider d/c Friday if she remains neurologically stable

## 2014-12-12 NOTE — Progress Notes (Signed)
Transcranial Doppler  Date POD PCO2 HCT BP  MCA ACA PCA OPHT SIPH VERT Basilar  12/06/14 MS     Right  Left   62  67   *  -43   *  *   20  27   *  *   -34  *   *      12/07/14 JE     Right  Left   61  61   *  -29   *  25   15  24    51  39   -35  *   -35      12/10/14 VS     Right  Left   23  *   -39  -29   *  *   20  16   29  21    -39  -34   -37      12/12/14 MS    32.4 160/120 Right  Left   22  61   -31  -38   *  *   17  17   52  48   -18  -51   *  -27          Right  Left                                            Right  Left                                            Right  Left                                        MCA = Middle Cerebral Artery      OPHT = Opthalmic Artery     BASILAR = Basilar Artery   ACA = Anterior Cerebral Artery     SIPH = Carotid Siphon PCA = Posterior Cerebral Artery   VERT = Verterbral Artery                   Normal MCA = 62+\-12 ACA = 50+\-12 PCA = 42+\-23   * Not insonated  Technically difficult due to limited small windows  [facility], RVS 12/10/2014 1450

## 2014-12-13 NOTE — Progress Notes (Signed)
Pt seen and examined. No issues overnight. Pt had mild HA this am. No other c/o.  EXAM: Temp:  [98.2 F (36.8 C)-99.1 F (37.3 C)] 98.6 F (37 C) (11/17 1144) Pulse Rate:  [56-97] 97 (11/17 1500) Resp:  [11-22] 11 (11/17 1500) BP: (107-163)/(50-89) 112/62 mmHg (11/17 1300) SpO2:  [100 %] 100 % (11/17 1500) Intake/Output      11/16 0701 - 11/17 0700 11/17 0701 - 11/18 0700   P.O. 1440    I.V. (mL/kg) 2400 (48.5) 800 (16.2)   Total Intake(mL/kg) 3840 (77.6) 800 (16.2)   Urine (mL/kg/hr) 200 (0.2)    Total Output 200     Net +3640 +800        Urine Occurrence 5 x 3 x   Stool Occurrence 2 x 1 x    Awake, alert, oriented Speech fluent CN intact Good strength  LABS: Lab Results  Component Value Date   CREATININE 0.59 12/12/2014   BUN 9 12/12/2014   NA 141 12/12/2014   K 3.8 12/12/2014   CL 108 12/12/2014   CO2 27 12/12/2014   Lab Results  Component Value Date   WBC 10.2 12/12/2014   HGB 10.8* 12/12/2014   HCT 32.4* 12/12/2014   MCV 74.5* 12/12/2014   PLT 444* 12/12/2014    IMPRESSION: - 59 y.o. female [place] d# 10 s/p right Pcom coiling, neurologically intact  PLAN: - Cont observation - May d/c home tomorrow

## 2014-12-14 ENCOUNTER — Other Ambulatory Visit: Payer: Self-pay | Admitting: *Deleted

## 2014-12-14 ENCOUNTER — Telehealth: Payer: Self-pay | Admitting: Family Medicine

## 2014-12-14 ENCOUNTER — Other Ambulatory Visit (HOSPITAL_COMMUNITY): Payer: Self-pay

## 2014-12-14 MED ORDER — METHYLPREDNISOLONE 4 MG PO TBPK: ORAL_TABLET | ORAL | Status: DC

## 2014-12-14 MED ORDER — NIMODIPINE 30 MG PO CAPS: 60.0000 mg | ORAL_CAPSULE | ORAL | Status: DC

## 2014-12-14 NOTE — Telephone Encounter (Signed)
Pt is requesting refill on lisinopril, has an appt on 01/11/15, [facility]

## 2014-12-14 NOTE — Telephone Encounter (Signed)
Opened in error. Jazmin Hartsell,CMA  

## 2014-12-14 NOTE — Discharge Summary (Signed)
  Physician Discharge Summary  Patient ID: Connie Wade MRN: [id number] DOB/AGE: 01-Sep-1955 59 y.o.  Admit date: 12/04/2014 Discharge date: 12/14/2014  Admission Diagnoses: [facility]  Discharge Diagnoses: Same Active Problems:   Ruptured cerebral aneurysm [facility])   Subarachnoid hemorrhage due to ruptured aneurysm ([place])   Encounter for central line placement   Sudden onset of severe headache   Ruptured cerebral aneurysm not due to trauma [facility])   Discharged Condition: Stable  Hospital Course:  Mrs. Connie Wade is a 59 y.o. female admitted after sudden onset of HA. CT demonstrated SAH, and she underwent diagnostic angiogram and coiling of a right Pcom aneurysm. She was monitored in the ICU without evidence of clinical vasospasm. She was discharged in stable condition  Treatments: Surgery - Coiling of right Pcom aneurysm  Discharge Exam: Blood pressure 136/106, pulse 82, temperature 98.3 F (36.8 C), temperature source Oral, resp. rate 19, height 5\' 2"  (1.575 m), weight 49.5 kg (109 lb 2 oz), SpO2 100 %. Awake, alert, oriented Speech fluent, appropriate CN grossly intact 5/5 BUE/BLE  Disposition: 01-Home or Self Care     Medication List    TAKE these medications        albuterol 108 (90 BASE) MCG/ACT inhaler  Commonly known as:  PROVENTIL HFA;VENTOLIN HFA  Inhale 2 puffs into the lungs every 6 (six) hours as needed for wheezing.     Emtricitab-Rilpivir-Tenofov DF 200-25-300 MG Tabs  Take 1 tablet by mouth daily.     methylPREDNISolone 4 MG Tbpk tablet  Commonly known as:  MEDROL  Take as directed on package     niMODipine 30 MG capsule  Commonly known as:  NIMOTOP  Take 2 capsules (60 mg total) by mouth every 4 (four) hours.       Follow-up Information    Follow up with Andrews Tener, C, MD In 4 weeks.   Specialty:  Neurosurgery   Contact information:   [address] [place] [address] [phone]       Signed: Consuella Lose, Loletha Grayer 12/14/2014, 10:39 AM

## 2014-12-14 NOTE — Care Management Note (Signed)
Case Management Note  Patient Details  Name: Breashia Bayard MRN: [id number] Date of Birth: 09/21/1955  Subjective/Objective:     Pt for dc home today.  She is uninsured, and on Nimotop therapy.                 Action/Plan: [place] letter given to pt with explanation of program benefits.  Instructed pt to go to [facility], as they have Nimotop in stock.  Pt verbalizes understanding of instructions.    Expected Discharge Date:    12/14/14             Expected Discharge Plan:  Home/Self Care  In-House Referral:     Discharge planning Services  CM Consult, [place] Program, Medication Assistance  Post Acute Care Choice:    Choice offered to:     DME Arranged:    DME Agency:     HH Arranged:    HH Agency:     Status of Service:  Completed, signed off  Medicare Important Message Given:    Date Medicare IM Given:    Medicare IM give by:    Date Additional Medicare IM Given:    Additional Medicare Important Message give by:     If discussed at [place] of Stay Meetings, dates discussed:    Additional Comments:  Reinaldo Raddle, RN, BSN  Trauma/Neuro ICU Case Manager [phone]

## 2014-12-14 NOTE — Telephone Encounter (Signed)
Will forward to MD. Jazmin Hartsell,CMA  

## 2014-12-17 ENCOUNTER — Other Ambulatory Visit: Payer: Self-pay | Admitting: Family Medicine

## 2014-12-17 MED ORDER — LISINOPRIL 10 MG PO TABS: 10.0000 mg | ORAL_TABLET | Freq: Every day | ORAL | Status: DC

## 2014-12-17 NOTE — Telephone Encounter (Signed)
Given 1 month Rx for lisinopril 10mg  qd. Has appt in December.

## 2014-12-24 ENCOUNTER — Encounter: Payer: Self-pay | Admitting: Infectious Disease

## 2014-12-24 ENCOUNTER — Ambulatory Visit (INDEPENDENT_AMBULATORY_CARE_PROVIDER_SITE_OTHER): Payer: Self-pay | Admitting: Infectious Disease

## 2014-12-24 VITALS — BP 131/86 | HR 72 | Temp 98.0°F | Ht 61.75 in | Wt 116.0 lb

## 2014-12-24 DIAGNOSIS — I607 Nontraumatic subarachnoid hemorrhage from unspecified intracranial artery: Secondary | ICD-10-CM

## 2014-12-24 DIAGNOSIS — B20 Human immunodeficiency virus [HIV] disease: Secondary | ICD-10-CM

## 2014-12-24 DIAGNOSIS — Z23 Encounter for immunization: Secondary | ICD-10-CM

## 2014-12-24 DIAGNOSIS — Z716 Tobacco abuse counseling: Secondary | ICD-10-CM

## 2014-12-24 DIAGNOSIS — I609 Nontraumatic subarachnoid hemorrhage, unspecified: Secondary | ICD-10-CM

## 2014-12-24 DIAGNOSIS — I10 Essential (primary) hypertension: Secondary | ICD-10-CM

## 2014-12-24 DIAGNOSIS — I608 Other nontraumatic subarachnoid hemorrhage: Secondary | ICD-10-CM

## 2014-12-24 DIAGNOSIS — Z720 Tobacco use: Secondary | ICD-10-CM

## 2014-12-24 LAB — LDL CHOLESTEROL, DIRECT: LDL DIRECT: 109 mg/dL (ref <130)

## 2014-12-24 LAB — LIPID PANEL
CHOL/HDL RATIO: 1.8 ratio (ref <=5.0)
Cholesterol: 254 mg/dL — ABNORMAL HIGH (ref 125–200)
HDL: 142 mg/dL (ref >=46)
LDL Cholesterol: 94 mg/dL (ref <130)
TRIGLYCERIDES: 88 mg/dL (ref <150)
VLDL: 18 mg/dL (ref <30)

## 2014-12-24 NOTE — Patient Instructions (Signed)
Make sure that you take ALL of your blood pressure medications  Make sure that you keep appt with your Family Medicine MD  We will also want to ensure that you see [place] in January to renew ADAP again--it has to be done twice a year  I will send in an anti-inflammatory cholesterol drug in once we know

## 2014-12-24 NOTE — Progress Notes (Signed)
Chief complaint: followup for hospitalization for ICH and HIV Subjective:    Patient ID: Connie Wade, female    DOB: 1955/03/29, 59 y.o.   MRN: [id number]  HPI   59 year old African American with HIV who is an ELITE controller and enrolled in [place] who was receiving Complera but stopped taking the meds due to her not wanting to be on meds for greater than 21 days and had to be taken off study. She was admitted to Neurosurgery with ruptured ICH and underwent embolization. She is taking nimotop and solumedrol after DC. She received ODEFSEY as an inpatient (in place of Complera) but states she is not taking any ARV now. She does not want to be on meds for her HIV now  Lab Results  Component Value Date   HIV1RNAQUANT <20 02/12/2012   Lab Results  Component Value Date   CD4TABS 724 10/30/2014   CD4TABS 1084 05/08/2014   CD4TABS 1158 04/03/2014    Past Medical History  Diagnosis Date  . HIV 03/25/2006    since 2000  . HEPATITIS B 03/25/2006  . HYPERLIPIDEMIA 03/25/2006  . UTERINE FIBROID 03/25/2006  . ANEMIA, OTHER, UNSPECIFIED 03/25/2006  . TOBACCO DEPENDENCE 03/25/2006  . DEPRESSION 03/17/2007  . HYPERTENSION, BENIGN ESSENTIAL 04/26/2007  . HYDRADENITIS 03/25/2006  . Tubular adenoma of colon 06/23/2011    Benign, no high grade dysplasia 06/16/11 Followed by [name] Dr. Watt Climes Repeat in 5 years (due 2018)    Past Surgical History  Procedure Laterality Date  . Ovarian cyst removal  2001  . Radiology with anesthesia N/A 12/05/2014    Procedure: RADIOLOGY WITH ANESTHESIA-COING FOR SUBARACHNOID HEMORRHAGE;  Surgeon: Consuella Lose, MD;  Location: [place];  Service: Radiology;  Laterality: N/A;    Family History  Problem Relation Age of Onset  . COPD Sister   . Arthritis Mother   . Alcohol abuse Father       Social History   Social History  . Marital Status: Legally Separated    Spouse Name: N/A  . Number of Children: N/A  . Years of Education: N/A   Social History Main  Topics  . Smoking status: Former Smoker -- 1.00 packs/day    Types: Cigarettes    Quit date: 12/17/2014  . Smokeless tobacco: None  . Alcohol Use: No     Comment: everday -- not since discharge  . Drug Use: No  . Sexual Activity:    Partners: Male   Other Topics Concern  . None   Social History Narrative    Allergies  Allergen Reactions  . Shrimp [Shellfish Allergy] Hives     Current outpatient prescriptions:  .  albuterol (PROVENTIL HFA;VENTOLIN HFA) 108 (90 BASE) MCG/ACT inhaler, Inhale 2 puffs into the lungs every 6 (six) hours as needed for wheezing., Disp: 1 Inhaler, Rfl: 6 .  lisinopril (PRINIVIL,ZESTRIL) 10 MG tablet, Take 1 tablet (10 mg total) by mouth daily., Disp: 30 tablet, Rfl: 0 .  methylPREDNISolone (MEDROL) 4 MG TBPK tablet, Take as directed on package, Disp: 21 tablet, Rfl: 0 .  niMODipine (NIMOTOP) 30 MG capsule, Take 2 capsules (60 mg total) by mouth every 4 (four) hours., Disp: 136 capsule, Rfl: 0     Review of Systems  Constitutional: Negative for fever, chills, diaphoresis, activity change, fatigue and unexpected weight change.  HENT: Negative for congestion, rhinorrhea, sinus pressure, sneezing, sore throat and trouble swallowing.   Eyes: Negative for photophobia and visual disturbance.  Respiratory: Negative for cough, chest tightness,  shortness of breath, wheezing and stridor.   Cardiovascular: Negative for chest pain, palpitations and leg swelling.  Gastrointestinal: Negative for nausea, vomiting, abdominal pain, diarrhea, constipation, blood in stool, abdominal distention and anal bleeding.  Genitourinary: Negative for dysuria, hematuria, flank pain and difficulty urinating.  Musculoskeletal: Negative for myalgias, back pain, arthralgias and gait problem.  Skin: Negative for color change, pallor, rash and wound.  Neurological: Negative for dizziness, tremors, weakness and light-headedness.  Hematological: Negative for adenopathy. Does not  bruise/bleed easily.  Psychiatric/Behavioral: Negative for suicidal ideas, behavioral problems, confusion, sleep disturbance, dysphoric mood, decreased concentration and agitation.       Objective:   Physical Exam  Constitutional: She is oriented to person, place, and time. She appears well-developed and well-nourished. No distress.  HENT:  Head: Normocephalic and atraumatic.  Mouth/Throat: Oropharynx is clear and moist. No oropharyngeal exudate.  Eyes: Conjunctivae and EOM are normal. Pupils are equal, round, and reactive to light. No scleral icterus.  Neck: Normal range of motion. Neck supple. No JVD present.  Cardiovascular: Normal rate, regular rhythm and normal heart sounds.  Exam reveals no gallop and no friction rub.   No murmur heard. Pulmonary/Chest: Effort normal and breath sounds normal. No respiratory distress. She has no wheezes. She has no rales. She exhibits no tenderness.  Abdominal: She exhibits no distension and no mass. There is no tenderness. There is no rebound and no guarding.  Musculoskeletal: She exhibits no edema or tenderness.  Lymphadenopathy:    She has no cervical adenopathy.  Neurological: She is alert and oriented to person, place, and time. She exhibits normal muscle tone. Coordination normal.  Skin: Skin is warm and dry. She is not diaphoretic. No erythema. No pallor.  Psychiatric: Her behavior is normal. Judgment and thought content normal.          Assessment & Plan:  HIV: Elite controller. I explained again that we DO NOT KNOW whether ELITE controllers benefit from ARV since they can control replication of the HIV virus with their immune system. There is greater immune activation and inflammation in Elite controllers than HIV + who take medications to suppress their viruses. Therefore and in light of recent [place] I strongly recommend that we at minimum start a statin for anti-inflammatory effects  ICH: on nimotop and ACEI. She is not insured but we  will continue ADAP which may cover many of her medicines but perhaps not nimotop. Will defer HTN management to her PCP for now but we can work with them as far which meds are covered. Again will start a statin as soon as I have her cholesterol and LDL back  Smoker: stopped since hospitalization. Hopefully stays away for good  I spent greater than 40 minutes with the patient including greater than 50% of time in face to face counsel of the patient re her HIV, being an ELITE controller, her ICH, hypertension smoking and in coordination of their care.

## 2014-12-25 LAB — T-HELPER CELL (CD4) - (RCID CLINIC ONLY)
CD4 T CELL ABS: 1010 /uL (ref 400–2700)
CD4 T CELL HELPER: 45 % (ref 33–55)

## 2014-12-25 LAB — HIV-1 RNA ULTRAQUANT REFLEX TO GENTYP+: HIV-1 RNA Quant, Log: <1.3 Log copies/mL (ref <1.30)

## 2015-01-11 ENCOUNTER — Encounter: Payer: Self-pay | Admitting: Family Medicine

## 2015-01-11 ENCOUNTER — Ambulatory Visit (INDEPENDENT_AMBULATORY_CARE_PROVIDER_SITE_OTHER): Payer: Self-pay | Admitting: Family Medicine

## 2015-01-11 VITALS — BP 159/76 | HR 87 | Temp 98.5°F | Ht 62.0 in | Wt 123.6 lb

## 2015-01-11 DIAGNOSIS — M546 Pain in thoracic spine: Secondary | ICD-10-CM | POA: Insufficient documentation

## 2015-01-11 DIAGNOSIS — I609 Nontraumatic subarachnoid hemorrhage, unspecified: Secondary | ICD-10-CM

## 2015-01-11 DIAGNOSIS — I606 Nontraumatic subarachnoid hemorrhage from other intracranial arteries: Secondary | ICD-10-CM

## 2015-01-11 DIAGNOSIS — Z720 Tobacco use: Secondary | ICD-10-CM

## 2015-01-11 NOTE — Assessment & Plan Note (Signed)
No new neurological deficits.  BP good control.  - Recommended maintaining follow-up with neurosurgeon as advised.  - Follow-up with PCP in 1-2 months for recheck of blood pressure

## 2015-01-11 NOTE — Assessment & Plan Note (Signed)
Has not smoked tobacco since hospital discharge.  - Encouraged continued smoking cessation

## 2015-01-11 NOTE — Assessment & Plan Note (Signed)
Paraspinal muscle tenderness and spasm without midline tenderness or red flags concerning for nerve impingement or infection.  - Recommended continuing Tylenol as needed as well as heating pads and gentle stretching - Follow-up in 2-3 weeks if not improving, will consider imaging: X-ray for evaluation of compression fracture - Recommended follow-up with PCP in 1-2 months for a DEXA scan for osteoporosis screening

## 2015-01-11 NOTE — Progress Notes (Signed)
   Subjective:    Patient ID: Connie Wade, female    DOB: Sep 22, 1955, 59 y.o.   MRN: [id number]  Seen for hospital follow-up  CC: Midthoracic back pain  Recently hospitalized 11/8 through 11/18 for ruptured cerebral aneurysm and subarachnoid hemorrhage.  She reports completing the steroid Dosepak and nimodipine as prescribed by her neurosurgeon.  She reports she has continued to take it easy, not exerting herself.  She has follow-up with her neurosurgeon this coming Wednesday.  She continues to take lisinopril 10 mg daily.  She has not smoked.  She has not smoked tobacco since her hospital discharge, but continues to smoke marijuana.  She reports her midthoracic back pain started 2 days ago.  Denies any trauma or injury.  Denies any lower extremity numbness, weakness.  Denies any history of osteoporosis, compression fractures; has never had DEXA scan.   Review of Systems   See HPI for ROS. Objective:  BP 159/76 mmHg  Pulse 87  Temp(Src) 98.5 F (36.9 C) (Oral)  Ht 5\' 2"  (1.575 m)  Wt 123 lb 9.6 oz (56.065 kg)  BMI 22.60 kg/m2  General: NAD Cardiac: RRR, normal heart sounds, no murmurs. 2+ radial and PT pulses bilaterally Respiratory: CTAB, normal effort Abdomen: soft, nontender, nondistended, no hepatic or splenomegaly. Bowel sounds present MSK: Midthoracic paraspinal tenderness.  No vertebral body tenderness.  Extremities: no edema or cyanosis. WWP. Skin: warm and dry, no rashes noted Neuro: alert and oriented, no focal deficits.  Gross lower extremity and sensation intact   Assessment & Plan:   Ruptured cerebral aneurysm not due to trauma ([place]) No new neurological deficits.  BP good control.  - Recommended maintaining follow-up with neurosurgeon as advised.  - Follow-up with PCP in 1-2 months for recheck of blood pressure   Tobacco abuse Has not smoked tobacco since hospital discharge.  - Encouraged continued smoking cessation  Thoracic back pain Paraspinal muscle  tenderness and spasm without midline tenderness or red flags concerning for nerve impingement or infection.  - Recommended continuing Tylenol as needed as well as heating pads and gentle stretching - Follow-up in 2-3 weeks if not improving, will consider imaging: X-ray for evaluation of compression fracture - Recommended follow-up with PCP in 1-2 months for a DEXA scan for osteoporosis screening

## 2015-01-12 ENCOUNTER — Other Ambulatory Visit: Payer: Self-pay | Admitting: Family Medicine

## 2015-01-16 ENCOUNTER — Other Ambulatory Visit: Payer: Self-pay | Admitting: Family Medicine

## 2015-01-16 NOTE — Telephone Encounter (Signed)
Needs refill on lisinopril [facility] [address]

## 2015-01-17 NOTE — Telephone Encounter (Signed)
Stop by to get refill on her medicine,

## 2015-01-21 ENCOUNTER — Other Ambulatory Visit: Payer: Self-pay | Admitting: Family Medicine

## 2015-01-22 NOTE — Telephone Encounter (Signed)
If her surgeon wants her on it then he can prescribe it. Otherwise she needs an appointment at [facility] to evaluate her blood pressure prior to restarting this medication to be sure that her blood pressure isn't too low to restart this.

## 2015-01-22 NOTE — Telephone Encounter (Signed)
Per patient she needs this medication per her surgeon because of her recent aneurism. Pt last seen for a hosp fu by PCP on 01/11/15. Pt would like for this medication to be sent to [facility]. Sadie Reynolds, ASA

## 2015-01-22 NOTE — Telephone Encounter (Signed)
LM for patient to call back.  Her medication was refused after speaking with provider and patient informed him that she doesn't take lisinopril.  If this has changed then she needs an appt to discuss this with pcp.  She hasn't been seen in clinic in over a year.  Juanita Devincent,CMA

## 2015-01-22 NOTE — Telephone Encounter (Signed)
Will forward to MD. Keana Dueitt,CMA  

## 2015-01-22 NOTE — Telephone Encounter (Signed)
Pt called because she needs a refill on her Lisinopril. The pharmacy told her that we refused the refill. jw

## 2015-01-23 NOTE — Telephone Encounter (Signed)
Patient scheduled for 02/07/15 at 2:45pm. Jazmin Hartsell,CMA

## 2015-02-07 ENCOUNTER — Ambulatory Visit: Payer: Self-pay | Admitting: Family Medicine

## 2015-02-09 ENCOUNTER — Other Ambulatory Visit: Payer: Self-pay | Admitting: Family Medicine

## 2015-02-14 ENCOUNTER — Encounter: Payer: Self-pay | Admitting: Family Medicine

## 2015-02-14 ENCOUNTER — Ambulatory Visit (INDEPENDENT_AMBULATORY_CARE_PROVIDER_SITE_OTHER): Payer: Self-pay | Admitting: Family Medicine

## 2015-02-14 VITALS — BP 148/79 | HR 66 | Temp 98.5°F | Wt 133.6 lb

## 2015-02-14 DIAGNOSIS — I606 Nontraumatic subarachnoid hemorrhage from other intracranial arteries: Secondary | ICD-10-CM

## 2015-02-14 DIAGNOSIS — I10 Essential (primary) hypertension: Secondary | ICD-10-CM

## 2015-02-14 DIAGNOSIS — I609 Nontraumatic subarachnoid hemorrhage, unspecified: Secondary | ICD-10-CM

## 2015-02-14 DIAGNOSIS — D649 Anemia, unspecified: Secondary | ICD-10-CM

## 2015-02-14 LAB — IRON AND TIBC
%SAT: 16 % (ref 11–50)
Iron: 51 ug/dL (ref 45–160)
TIBC: 311 ug/dL (ref 250–450)
UIBC: 260 ug/dL (ref 125–400)

## 2015-02-14 LAB — CBC
HEMATOCRIT: 36.7 % (ref 36.0–46.0)
Hemoglobin: 11.9 g/dL — ABNORMAL LOW (ref 12.0–15.0)
MCH: 24.2 pg — ABNORMAL LOW (ref 26.0–34.0)
MCHC: 32.4 g/dL (ref 30.0–36.0)
MCV: 74.7 fL — AB (ref 78.0–100.0)
MPV: 8.9 fL (ref 8.6–12.4)
Platelets: 536 10*3/uL — ABNORMAL HIGH (ref 150–400)
RBC: 4.91 MIL/uL (ref 3.87–5.11)
RDW: 21.4 % — AB (ref 11.5–15.5)
WBC: 7.3 10*3/uL (ref 4.0–10.5)

## 2015-02-14 LAB — FERRITIN: Ferritin: 46 ng/mL (ref 10–291)

## 2015-02-14 LAB — POCT GLYCOSYLATED HEMOGLOBIN (HGB A1C): Hemoglobin A1C: 5.2

## 2015-02-14 MED ORDER — LISINOPRIL 10 MG PO TABS: 10.0000 mg | ORAL_TABLET | Freq: Every day | ORAL | Status: DC

## 2015-02-14 NOTE — Assessment & Plan Note (Addendum)
We will attempt to get blood pressure under better control with goal less than 140/90 - Restart lisinopril 10 mg daily - Advised to keep schedule appointment with vascular surgeon.  She reports she has appointment in 6 months - check A1c due to previous elevated blood glucoses

## 2015-02-14 NOTE — Progress Notes (Signed)
  Patient name: Connie Wade MRN [id number]  Date of birth: 11/20/1955  CC & HPI:  Connie Wade is a 60 y.o. female presenting today for HTN.   CHRONIC HYPERTENSION  BP Readings from Last 3 Encounters:  02/14/15 148/79  01/11/15 159/76  12/24/14 131/86    Felt to follow-up for blood pressure recheck after last visit.  Previously she was on lisinopril 10 mg daily, which was held at her hospital discharge for her cerebral aneurysm.  She continues to report that she has not smoked since her hospital discharge.   Control: poor Disease Monitoring  Blood pressure range outside clinc: not checking  Chest pain: no   Dyspnea: no   Claudication: no  Medication compliance: no, never followed up after last visit for repeat blood pressure check and medication as advised  Preventitive Healthcare:   History  Smoking status  . Former Smoker -- 1.00 packs/day  . Types: Cigarettes  . Quit date: 12/17/2014  Smokeless tobacco  . Not on file   Anemia - Denies any chest pain, shortness of breath, dizziness or palpitations  ROS: See HPI   Medical & Surgical Hx:  Reviewed  Medications & Allergies: Reviewed  Social History: Reviewed:   Objective Findings:  Vitals: BP 148/79 mmHg  Pulse 66  Temp(Src) 98.5 F (36.9 C) (Oral)  Wt 133 lb 9.6 oz (60.601 kg)  Gen: NAD CV: RRR w/o m/r/g, pulses +2 b/l Resp: CTAB w/ normal respiratory effort   Assessment & Plan:   Ruptured cerebral aneurysm not due to trauma [facility]) We will attempt to get blood pressure under better control with goal less than 140/90 - Restart lisinopril 10 mg daily - Advised to keep schedule appointment with vascular surgeon.  She reports she has appointment in 6 months - check A1c due to previous elevated blood glucoses  HYPERTENSION, BENIGN ESSENTIAL Poor control.  Goal less than 140/90 - Restart lisinopril 10 mg daily Follow-up in 2 weeks for blood pressure recheck and medication titration as needed-   ANEMIA,  OTHER, UNSPECIFIED Microcytic anemia with history of vascular surgery for cerebral aneurysm - Check CBC, iron panel and ferritin

## 2015-02-14 NOTE — Patient Instructions (Signed)
It was great seeing you today.  Your blood pressure is elevated today, and we will restart her previous medication.   1. Start lisinopril 10 mg daily.  2. I will also order some blood work to follow-up on your anemia.  We will discuss this at your next visit in 2 weeks   Please bring all your medications to every doctors visit  Sign up for My Chart to have easy access to your labs results, and communication with your Primary care physician.  Next Appointment  Please make an appointment with Dr Berkley Harvey in 2 weeks for hypertension and anemia.    I look forward to talking with you again at our next visit. If you have any questions or concerns before then, please call the clinic at [phone].  Take Care,   Dr Phill Myron

## 2015-02-14 NOTE — Assessment & Plan Note (Signed)
Microcytic anemia with history of vascular surgery for cerebral aneurysm - Check CBC, iron panel and ferritin

## 2015-02-14 NOTE — Assessment & Plan Note (Signed)
Poor control.  Goal less than 140/90 - Restart lisinopril 10 mg daily Follow-up in 2 weeks for blood pressure recheck and medication titration as needed-

## 2015-02-28 ENCOUNTER — Ambulatory Visit (INDEPENDENT_AMBULATORY_CARE_PROVIDER_SITE_OTHER): Payer: BLUE CROSS/BLUE SHIELD | Admitting: Family Medicine

## 2015-02-28 ENCOUNTER — Encounter: Payer: Self-pay | Admitting: Family Medicine

## 2015-02-28 VITALS — BP 139/73 | Temp 98.3°F | Wt 132.7 lb

## 2015-02-28 DIAGNOSIS — I10 Essential (primary) hypertension: Secondary | ICD-10-CM | POA: Diagnosis not present

## 2015-02-28 NOTE — Patient Instructions (Signed)
It was great seeing you today.   1.  Your blood pressure is under good control today.  I recommend you continue taking lisinopril 10 mg every day.  2.  Your last blood work showed that you were low in iron,  I recommend you take a multivitamin daily with iron.  3.  Record your blood pressure daily and bring these readings to your next appointment.    Please bring all your medications to every doctors visit  Sign up for My Chart to have easy access to your labs results, and communication with your Primary care physician.  Next Appointment  Make a nurse visit for blood pressure check in 2 weeks  Please call to make an appointment with Dr Berkley Harvey in 1-2 month for blood pressure  I look forward to talking with you again at our next visit. If you have any questions or concerns before then, please call the clinic at [phone].  Take Care,   Dr Phill Myron

## 2015-02-28 NOTE — Progress Notes (Signed)
  Patient name: Connie Wade MRN [id number]  Date of birth: 09-28-1955  CC & HPI:  Connie Wade is a 60 y.o. female presenting today for HTN.   CHRONIC HYPERTENSION  BP Readings from Last 3 Encounters:  02/28/15 139/73  02/14/15 148/79  01/11/15 159/76    Control: good Disease Monitoring  Blood pressure range outside clinc: checks daily; reports less than 140/90  Chest pain: no   Dyspnea: no   Claudication: no  Medication compliance: yes  Medication Side Effects: NO Dizziness/lightheadedness;    Preventitive Healthcare:   History  Smoking status  . Former Smoker -- 1.00 packs/day  . Types: Cigarettes  . Quit date: 12/17/2014  Smokeless tobacco  . Not on file    ROS: See HPI   Medical & Surgical Hx:  Reviewed  Medications & Allergies: Reviewed  Social History: Reviewed:   Objective Findings:  Vitals: BP 139/73 mmHg  Temp(Src) 98.3 F (36.8 C) (Oral)  Wt 132 lb 11.2 oz (60.192 kg)  Gen: NAD CV: RRR w/o m/r/g, pulses +2 b/l Resp: CTAB w/ normal respiratory effort  Assessment & Plan:   HYPERTENSION, BENIGN ESSENTIAL  Blood pressure under good control today. -  Continue lisinopril 10 mg daily -  Recommend recording blood pressure daily at home and bring it nurse visit in 2 weeks for blood pressure recheck -  Follow-up with PCP in 1-2 months for hypertension

## 2015-02-28 NOTE — Assessment & Plan Note (Signed)
Blood pressure under good control today. -  Continue lisinopril 10 mg daily -  Recommend recording blood pressure daily at home and bring it nurse visit in 2 weeks for blood pressure recheck -  Follow-up with PCP in 1-2 months for hypertension

## 2015-03-14 ENCOUNTER — Telehealth: Payer: Self-pay | Admitting: Family Medicine

## 2015-03-14 ENCOUNTER — Ambulatory Visit (INDEPENDENT_AMBULATORY_CARE_PROVIDER_SITE_OTHER): Payer: BLUE CROSS/BLUE SHIELD | Admitting: *Deleted

## 2015-03-14 VITALS — BP 148/82 | HR 83

## 2015-03-14 DIAGNOSIS — I10 Essential (primary) hypertension: Secondary | ICD-10-CM

## 2015-03-14 DIAGNOSIS — Z0130 Encounter for examination of blood pressure without abnormal findings: Secondary | ICD-10-CM

## 2015-03-14 DIAGNOSIS — Z136 Encounter for screening for cardiovascular disorders: Secondary | ICD-10-CM

## 2015-03-14 MED ORDER — LISINOPRIL 20 MG PO TABS: 20.0000 mg | ORAL_TABLET | Freq: Every day | ORAL | Status: DC

## 2015-03-14 MED ORDER — LISINOPRIL 20 MG PO TABS: 10.0000 mg | ORAL_TABLET | Freq: Every day | ORAL | Status: DC

## 2015-03-14 NOTE — Progress Notes (Signed)
   Patient in nurse clinic for blood pressure check.  Patient brought in a list of home BP readings.  Patient stated she is taking her blood pressure medications as prescribed.  Denies any symptoms.  03/01/15: 146/84, HR 84  03/02/15: 154/64, HR 81 03/04/15: 143/69 03/07/15: 137/85 03/11/15: 158/88 03/13/15: 137/77 Today's Vitals   03/14/15 1106  BP: 148/82  Pulse: 83  SpO2: 98%  PainSc: 0-No pain

## 2015-03-14 NOTE — Telephone Encounter (Signed)
Called about nurse BP visit. BP borderline elevated.  - Increased lisinopril to 20mg  qd - f/u already scheduled

## 2015-04-25 ENCOUNTER — Encounter: Payer: Self-pay | Admitting: Family Medicine

## 2015-04-25 ENCOUNTER — Ambulatory Visit (INDEPENDENT_AMBULATORY_CARE_PROVIDER_SITE_OTHER): Payer: BLUE CROSS/BLUE SHIELD | Admitting: Family Medicine

## 2015-04-25 VITALS — BP 150/90 | HR 88 | Temp 98.4°F | Ht 62.0 in | Wt 132.0 lb

## 2015-04-25 DIAGNOSIS — M2021 Hallux rigidus, right foot: Secondary | ICD-10-CM

## 2015-04-25 DIAGNOSIS — I10 Essential (primary) hypertension: Secondary | ICD-10-CM

## 2015-04-25 DIAGNOSIS — M79674 Pain in right toe(s): Secondary | ICD-10-CM | POA: Diagnosis not present

## 2015-04-25 NOTE — Patient Instructions (Addendum)
It was great seeing you today.    Your right hip pain.  This most likely due to arthritis in her first toe.  This known as hallux rigidus.  Wearing a rigid soled shoes will reduce your pain with this.  You can take Tylenol 1000 mg every 8 hours as needed.   Return to clinic for reevaluation if your pain persist past 1 week, and we can discuss steroid injection versus referral to sports medicine.      Please bring all your medications to every doctors visit  Sign up for My Chart to have easy access to your labs results, and communication with your Primary care physician.  Next Appointment  Please make an appointment with Dr Berkley Harvey in 1-2 weeks for high blood pressure   I look forward to talking with you again at our next visit. If you have any questions or concerns before then, please call the clinic at [phone].  Take Care,   Dr Phill Myron Hallux Rigidus Hallux rigidus is a condition involving pain and a loss of motion of the first (big) toe. The pain gets worse with lifting up (extension) of the toe. This is usually due to arthritic bony bumps (spurring) of the joint at the base of the big toe.  SYMPTOMS   Pain, with lifting up of the toe.  Tenderness over the joint where the big toe meets the foot.  Redness, swelling, and warmth over the top of the base of the big toe (sometimes).  Foot pain, stiffness, and limping. CAUSES  Hallux rigidus is caused by arthritis of the joint where the big toe meets the foot. The arthritis creates a bone spur that pinches the soft tissues when the toe is extended. RISK INCREASES WITH:  Tight shoes with a narrow toe box.  Family history of foot problems.  Gout and rheumatoid and psoriatic arthritis.  History of previous toe injury, including "turf toe."  Long first toe, flat feet, and other big toe bony bumps.  Arthritis of the big toe. PREVENTION   Wear wide-toed shoes that fit well.  Tape the big toe to reduce motion and  to prevent pinching of the tissues between the bone.  Maintain physical fitness:  Foot and ankle flexibility.  Muscle strength and endurance. PROGNOSIS  This condition can usually be managed with proper treatment. However, surgery is typically required to prevent the problem from recurring.  RELATED COMPLICATIONS  Injury to other areas of the foot or ankle, caused by abnormal walking in an attempt to avoid the pain felt when walking normally. TREATMENT Treatment first involves stopping the activities that aggravate your symptoms. Ice and medicine can be used to reduce the pain and inflammation. Modifications to shoes may help reduce pain, including wearing stiff-soled shoes, shoes with a wide toe box, inserting a padded donut to relieve pressure on top of the joint, or wearing an arch support. Corticosteroid injections may be given to reduce inflammation. If nonsurgical treatment is unsuccessful, surgery may be needed. Surgical options include removing the arthritic bony spur, cutting a bone in the foot to change the arc of motion (allowing the toe to extend more), or fusion of the joint (eliminating all motion in the joint at the base of the big toe).  MEDICATION   If pain medicine is needed, nonsteroidal anti-inflammatory medicines (aspirin and ibuprofen), or other minor pain relievers (acetaminophen), are often advised.  Do not take pain medicine for 7 days before surgery.  Prescription pain relievers are usually  prescribed only after surgery. Use only as directed and only as much as you need.  Ointments for arthritis, applied to the skin, may give some relief.  Injections of corticosteroids may be given to reduce inflammation. HEAT AND COLD  Cold treatment (icing) relieves pain and reduces inflammation. Cold treatment should be applied for 10 to 15 minutes every 2 to 3 hours, and immediately after activity that aggravates your symptoms. Use ice packs or an ice massage.  Heat treatment  may be used before performing the stretching and strengthening activities prescribed by your caregiver, physical therapist, or athletic trainer. Use a heat pack or a warm water soak. SEEK MEDICAL CARE IF:   Symptoms get worse or do not improve in 2 weeks, despite treatment.  After surgery you develop fever, increasing pain, redness, swelling, drainage of fluids, bleeding, or increasing warmth.  New, unexplained symptoms develop. (Drugs used in treatment may produce side effects.)   This information is not intended to replace advice given to you by your health care provider. Make sure you discuss any questions you have with your health care provider.   Document Released: 01/12/2005 Document Revised: 02/02/2014 Document Reviewed: 04/26/2008 Elsevier Interactive Patient Education [employer].

## 2015-04-25 NOTE — Assessment & Plan Note (Signed)
BP elevated today in clinic, but currently in pain.  Reports home readings, all less than 140/90 - Continue lisinopril 20 mg daily - Advised checking home BP readings to 3 times daily; bring to nurse blood pressure check in 3-5 days - Follow-up with PCP in 1-2 weeks.  Repeat blood pressure check and likely increase lisinopril and recheck creatinine

## 2015-04-25 NOTE — Progress Notes (Signed)
  Patient name: Connie Wade MRN [id number]  Date of birth: 1955-06-25  CC & HPI:  Connie Wade is a 60 y.o. female presenting today for hypertension, right toe.   CHRONIC HYPERTENSION  BP Readings from Last 3 Encounters:  04/25/15 150/90  03/14/15 148/82  02/28/15 139/73    Control: fair Disease Monitoring  Blood pressure range outside clinc: reports BP < 140/90 at home  Chest pain: no   Dyspnea: no   Claudication: no  Medication compliance: no  Medication Side Effects: No Dizziness/lightheadedness; cough, angioedema Preventitive Healthcare:   History  Smoking status  . Former Smoker -- 1.00 packs/day  . Types: Cigarettes  . Quit date: 12/17/2014  Smokeless tobacco  . Not on file   Right great toe pain - She reports intermittent pain in her right big toe for the past several years - Reports previous diagnosis of gout that she treats with Cherry juice - Denies acute swelling or redness - Has been present for the past 4-6 weeks, worse with ambulation - Denies previous injuries to come to right foot  Smoking History Noted  Objective Findings:  Vitals: BP 150/90 mmHg  Pulse 88  Temp(Src) 98.4 F (36.9 C) (Oral)  Ht 5\' 2"  (1.575 m)  Wt 132 lb (59.875 kg)  BMI 24.14 kg/m2  SpO2 99%  Gen: NAD CV: RRR w/o m/r/g, pulses +2 b/l Resp: CTAB w/ normal respiratory effort Right Foot: Bony enlargement of first MTP, no erythema or swelling; first MTP extension limited to 5  Assessment & Plan:   HYPERTENSION, BENIGN ESSENTIAL BP elevated today in clinic, but currently in pain.  Reports home readings, all less than 140/90 - Continue lisinopril 20 mg daily - Advised checking home BP readings to 3 times daily; bring to nurse blood pressure check in 3-5 days - Follow-up with PCP in 1-2 weeks.  Repeat blood pressure check and likely increase lisinopril and recheck creatinine  Pain of right great toe Toe pain, most likely due to hallux rigidus versus sesamoiditis.  X-rays  2 years ago revealed loss of joint space, sclerosis, and spurring in 1st MTP joint - Recommended Tylenol 1000 mg 3 times a day when necessary - Advised her to wear rigid soled shoes - Follow-up in 1-2 weeks; consider first MTP steroid injection versus referral to sports medicine for custom orthotics pain persists

## 2015-04-25 NOTE — Progress Notes (Signed)
Dr. Berkley Harvey requested a [place].   Presenting Issue:  Patient had a PHQ-9 of 17 and was reported to be drinking 16 oz of beer per night in an effort to cope.  Had a cerebral aneurysm in 11/2014 and just returned to work three weeks ago.  Is struggling physically and mentally.  Report of symptoms:  Sad mood.  Tearfulness.  Anhedonia.  Duration of CURRENT symptoms:  Since hospitalization.  Impact on function:  Missed two days of work in the last three weeks secondary to "body aches."  Symptoms are affecting general function - not getting things done she "should" and not doing things that she used to do for fun.  Psychiatric History - Diagnoses: Denied prior diagnoses. - Hospitalizations: Denied other than two inpatient rehab experiences "10-20 years ago" for crack cocaine. - Pharmacotherapy: Wellbutrin for smoking cessation.  Valium in 2008.  She said it was for depression. - Outpatient therapy: None.  Family history of psychiatric issues:  Did not assess.  Current and history of substance use:  Uses marijuana; reported two joints per day.  Thinks it helps her relax.  Does not know of any downsides.  Drinks 16 ounces of beer at night.  States that prior to hospitalization, she drank a 6 pack a day.    Medical conditions that might explain or contribute to symptoms:  Chart reviewed.    Other:  Husband died Jan 10, 2013.  Doesn't think she ever grieved.  Thinks this might be contributed to current symptoms.  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    PHQ-9:  17  Assessment / Plan / Recommendations: Brief consultation today with a focus on next best step.  Used MI strategies to help reconcile the patient's reluctance to start any "medicines" for mood / anxiety management with the fact she uses marijuana and beer regularly to help her relax.  Asked her to consider experimenting with decreasing substance use to see if that has a positive effect on her mood.  She does not feel like she  is currently at risk for relapse of crack.  Asked if she were interested in follow-up with [facility] to explore strategies to help manage her mood.  She was.  Afternoons are better for her.  Looked at first available and scheduled her with Cristie Hem for 4/4 at 3:00

## 2015-04-25 NOTE — Assessment & Plan Note (Signed)
Toe pain, most likely due to hallux rigidus versus sesamoiditis.  X-rays 2 years ago revealed loss of joint space, sclerosis, and spurring in 1st MTP joint - Recommended Tylenol 1000 mg 3 times a day when necessary - Advised her to wear rigid soled shoes - Follow-up in 1-2 weeks; consider first MTP steroid injection versus referral to sports medicine for custom orthotics pain persists

## 2015-04-30 ENCOUNTER — Ambulatory Visit (INDEPENDENT_AMBULATORY_CARE_PROVIDER_SITE_OTHER): Payer: Self-pay | Admitting: Psychology

## 2015-04-30 DIAGNOSIS — F32A Depression, unspecified: Secondary | ICD-10-CM

## 2015-04-30 DIAGNOSIS — F329 Major depressive disorder, single episode, unspecified: Secondary | ICD-10-CM

## 2015-04-30 NOTE — Progress Notes (Signed)
Reason for follow-up:  Dr. Gwenlyn Saran referred Connie Wade to [facility] for a follow-up IC appointment.  Issues discussed: [facility] and Connie Wade discussed her depressive symptoms, alcohol and marijuana use, and her concerns regarding medication. Connie Wade reported that she does not want to take any medication unless absolutely necessary--primarily because she is concerned about physical dependency. The remainder of the appointment was used to explore her aversion to medication--and current substance use--through the use of both motivational interviewing and psychoeducation about some of the ways in which marijuana and alcohol use can affect mood and the brain, SSRIs and their mechanisms of action, and general information regarding safety, common side effects, and risks versus benefits compared to the use of marijuana and alcohol as relaxation and sleep aids.  Identified goals:  Connie Wade stated that she does not want to stop drinking alcohol entirely but would like to stop drinking beer and reduce all alcohol consumption to one glass of wine with dinner. She also stated that she was unaware of much of information discussed today regarding SSRIs and the effects of alcohol and marijuana on mood. In light of this information, she reported willingness to stop smoking marijuana entirely for a period of a few weeks to see if her mood and depressive symptoms improve. She said that she plans to drink and smoke on the day of her birthday later this month but feels confident that she will be able to abstain from marijuana use for "at least a few weeks" beginning the day after her birthday. She will return for a follow-up IC appointment on 4/18.

## 2015-05-14 ENCOUNTER — Ambulatory Visit (INDEPENDENT_AMBULATORY_CARE_PROVIDER_SITE_OTHER): Payer: BLUE CROSS/BLUE SHIELD | Admitting: Psychology

## 2015-05-14 DIAGNOSIS — F329 Major depressive disorder, single episode, unspecified: Secondary | ICD-10-CM

## 2015-05-14 DIAGNOSIS — F32A Depression, unspecified: Secondary | ICD-10-CM

## 2015-05-14 NOTE — Progress Notes (Addendum)
Issues discussed:  After further discussion of Connie Wade's marijuana and alcohol use, she reported that she has reduced her drinking and has had only three drinks over the past four nights. She said that she has not been experiencing any withdrawal symptoms and did not find it very difficult to abstain from drinking during those few nights. She reported smoking approximately the same amount of weed per day and per week as she typically does. After further psychoeducation around these substances, mood, medication, and sleep hygiene, Connie Wade reported that she has decided against trying medication in lieu of marijuana or alcohol.   [facility] and Connie Wade also discussed her grief about her husband's death. She said that she feels like she is "finally starting to grieve" and described it as sad and painful, but also good for her. [facility] and Connie Wade worked to identify sources of social support and means of talking about and further processing her husband's death and her grief. The discussion eventuated in recognition that her church, where she attends services every Sunday and lately has started to garden with other church members, is a safe place where she feels comfortable discussing these issues.  After assessing various aspects of her current functioning, it appears that Connie Wade is functioning relatively well. [facility] and she discussed her thoughts about future IC meetings, and ultimately decided that additional [place] meetings are not imperative at this time. [facility] emphasized the importance of continuing to monitor her mood and thoughts, and reminded her that she can always call to schedule a future appointment if she feels it would be helpful to do so.

## 2015-05-21 ENCOUNTER — Other Ambulatory Visit: Payer: Self-pay | Admitting: Family Medicine

## 2015-05-31 ENCOUNTER — Other Ambulatory Visit (HOSPITAL_COMMUNITY): Payer: Self-pay | Admitting: Neurosurgery

## 2015-05-31 DIAGNOSIS — I6031 Nontraumatic subarachnoid hemorrhage from right posterior communicating artery: Secondary | ICD-10-CM

## 2015-06-03 ENCOUNTER — Telehealth (HOSPITAL_COMMUNITY): Payer: Self-pay | Admitting: Neurosurgery

## 2015-06-03 NOTE — Telephone Encounter (Signed)
Called pt, left VM for her to call me back to schedule her f/u angio JM

## 2015-06-25 ENCOUNTER — Other Ambulatory Visit: Payer: Self-pay | Admitting: Family Medicine

## 2015-06-28 NOTE — Telephone Encounter (Signed)
appt made 07-04-15. Connie Wade,CMA

## 2015-07-01 ENCOUNTER — Telehealth (HOSPITAL_COMMUNITY): Payer: Self-pay | Admitting: Neurosurgery

## 2015-07-01 NOTE — Telephone Encounter (Signed)
Called pt, left VM to call to schedule angio JM

## 2015-07-04 ENCOUNTER — Ambulatory Visit (INDEPENDENT_AMBULATORY_CARE_PROVIDER_SITE_OTHER): Payer: BLUE CROSS/BLUE SHIELD | Admitting: Family Medicine

## 2015-07-04 ENCOUNTER — Encounter: Payer: Self-pay | Admitting: Family Medicine

## 2015-07-04 VITALS — BP 135/77 | HR 74 | Temp 98.3°F | Ht 62.0 in | Wt 139.0 lb

## 2015-07-04 DIAGNOSIS — D649 Anemia, unspecified: Secondary | ICD-10-CM

## 2015-07-04 DIAGNOSIS — M79674 Pain in right toe(s): Secondary | ICD-10-CM | POA: Diagnosis not present

## 2015-07-04 DIAGNOSIS — E785 Hyperlipidemia, unspecified: Secondary | ICD-10-CM | POA: Diagnosis not present

## 2015-07-04 DIAGNOSIS — I10 Essential (primary) hypertension: Secondary | ICD-10-CM | POA: Diagnosis not present

## 2015-07-04 DIAGNOSIS — J301 Allergic rhinitis due to pollen: Secondary | ICD-10-CM | POA: Diagnosis not present

## 2015-07-04 DIAGNOSIS — M545 Low back pain, unspecified: Secondary | ICD-10-CM

## 2015-07-04 DIAGNOSIS — M79609 Pain in unspecified limb: Secondary | ICD-10-CM | POA: Diagnosis not present

## 2015-07-04 LAB — COMPLETE METABOLIC PANEL WITH GFR
ALK PHOS: 67 U/L (ref 33–130)
ALT: 15 U/L (ref 6–29)
AST: 16 U/L (ref 10–35)
Albumin: 4.1 g/dL (ref 3.6–5.1)
BILIRUBIN TOTAL: 0.4 mg/dL (ref 0.2–1.2)
BUN: 8 mg/dL (ref 7–25)
CHLORIDE: 106 mmol/L (ref 98–110)
CO2: 25 mmol/L (ref 20–31)
Calcium: 9.3 mg/dL (ref 8.6–10.4)
Creat: 0.63 mg/dL (ref 0.50–0.99)
GFR, Est African American: >89 mL/min (ref >=60)
GLUCOSE: 70 mg/dL (ref 65–99)
POTASSIUM: 4.3 mmol/L (ref 3.5–5.3)
SODIUM: 139 mmol/L (ref 135–146)
Total Protein: 7.3 g/dL (ref 6.1–8.1)

## 2015-07-04 LAB — CBC
HCT: 37.6 % (ref 35.0–45.0)
HEMOGLOBIN: 12 g/dL (ref 11.7–15.5)
MCH: 24.2 pg — ABNORMAL LOW (ref 27.0–33.0)
MCHC: 31.9 g/dL — ABNORMAL LOW (ref 32.0–36.0)
MCV: 76 fL — ABNORMAL LOW (ref 80.0–100.0)
MPV: 8.7 fL (ref 7.5–12.5)
PLATELETS: 478 10*3/uL — AB (ref 140–400)
RBC: 4.95 MIL/uL (ref 3.80–5.10)
RDW: 19.3 % — ABNORMAL HIGH (ref 11.0–15.0)
WBC: 6.9 10*3/uL (ref 3.8–10.8)

## 2015-07-04 LAB — URIC ACID: URIC ACID, SERUM: 6.4 mg/dL (ref 2.5–7.0)

## 2015-07-04 MED ORDER — FLUTICASONE PROPIONATE 50 MCG/ACT NA SUSP: 2.0000 | Freq: Every day | NASAL | Status: DC

## 2015-07-04 NOTE — Assessment & Plan Note (Signed)
>>  ASSESSMENT AND PLAN FOR HLD (HYPERLIPIDEMIA) WRITTEN ON 07/04/2015  1:38 PM BY Jamal Collin, MD  ASCVD risk > 7.5 % - recommend starting statin; she will discuss with her neurosurgeon this month

## 2015-07-04 NOTE — Progress Notes (Signed)
  Patient name: Connie Wade MRN [id number]  Date of birth: 12/18/1955  CC & HPI:  Connie Wade is a 60 y.o. female presenting today for HTN, Back pain, Right toe pain, Allergies.   CHRONIC HYPERTENSION  BP Readings from Last 3 Encounters:  07/04/15 135/77  04/25/15 150/90  03/14/15 148/82    Control: good Disease Monitoring  Chest pain: no   Dyspnea: no   Claudication: no  Medication compliance yes  Medication Side Effects: Denies Dizziness/lightheadedness; Reports mild cough but has allergies Preventitive Healthcare:   History  Smoking status  . Former Smoker -- 1.00 packs/day  . Types: Cigarettes  . Quit date: 12/17/2014  Smokeless tobacco  . Not on file   Right great toe pain - she reports chronic daily right great toe pain with intermittent periods of exacerbation lasting 3-5 days - Pain described as burning sensation; has to sleep with feet out from under her covers during exacerbations - No history of Gout - Denies recent or remote trauma - Reports 4 episodes this year  LBP - Continues to endorse lower midline and bilateral back pain - Reports stiffness in the morning, lasting 5-10 minutes - Denies fevers, chills, weight loss, night sweat, Lower extremity weakness or numbness  Allergies - Reports recent increase in runny nose coughing and sneezing - Restarted Zyrtec  Smoking History Noted  Objective Findings:  Vitals: BP 135/77 mmHg  Pulse 74  Temp(Src) 98.3 F (36.8 C) (Oral)  Ht 5\' 2"  (1.575 m)  Wt 139 lb (63.05 kg)  BMI 25.42 kg/m2  Gen: NAD CV: RRR w/o m/r/g, pulses +2 b/l Resp: CTAB w/ normal respiratory effort Right Great Toe: tophi noted at MTP joint, No erythema, no swelling Back: Malrotation of spine noted.  Assessment & Plan:   Pain of right great toe Still have some limited ROM however recent history more  consistent with gout especially with tophi now present on physical exam - check uric acid - consider Xrays  - Likely will  start allopurinol / colchicine pending lab work  Low back pain Malrotation of spine noted. Pain likely related to Scoliosis and muscle skeletal strain.  - DG lumbar complete - Will likely refer to [facility] for PT    HYPERTENSION, BENIGN ESSENTIAL At goal <140/90 - Continue lisinopril 20 mg daily; considering switching to ARB if cough persist after treatment of allergies - check CMP, CBC  Allergic rhinitis Continue Zyrtec - Restart Flonase - Follow-up in one month  HLD (hyperlipidemia) ASCVD risk > 7.5 % - recommend starting statin; she will discuss with her neurosurgeon this month

## 2015-07-04 NOTE — Assessment & Plan Note (Signed)
Continue Zyrtec - Restart Flonase - Follow-up in one month

## 2015-07-04 NOTE — Assessment & Plan Note (Signed)
Check CBC 

## 2015-07-04 NOTE — Assessment & Plan Note (Signed)
At goal <140/90 - Continue lisinopril 20 mg daily; considering switching to ARB if cough persist after treatment of allergies - check CMP, CBC

## 2015-07-04 NOTE — Assessment & Plan Note (Signed)
Malrotation of spine noted. Pain likely related to Scoliosis and muscle skeletal strain.  - DG lumbar complete - Will likely refer to [facility] Barthel for PT

## 2015-07-04 NOTE — Patient Instructions (Addendum)
Your right great toe pain is most likely due to gout.  - Call immediately if you experience another flare-up and I will call in steroids.  Your back pain is most likely due to scoliosis.  - I have ordered x-rays to evaluate this - I will call when I have the results of the x-rays and most likely refer you to [facility] for physical therapy  Keep your follow-up appointment with your neurosurgeon - Discussed starting cholesterol medication with them - Discussed with whether or not you're able to take NSAIDs  Continue to follow-up with infectious disease as scheduled

## 2015-07-04 NOTE — Assessment & Plan Note (Addendum)
Still have some limited ROM however recent history more  consistent with gout especially with tophi now present on physical exam - check uric acid - consider Xrays  - Likely will start allopurinol / colchicine pending lab work

## 2015-07-04 NOTE — Assessment & Plan Note (Signed)
ASCVD risk > 7.5 % - recommend starting statin; she will discuss with her neurosurgeon this month

## 2015-07-10 ENCOUNTER — Telehealth: Payer: Self-pay | Admitting: Family Medicine

## 2015-07-10 NOTE — Telephone Encounter (Signed)
LM for patient to call back. Jazmin Hartsell,CMA  

## 2015-07-10 NOTE — Telephone Encounter (Signed)
Attempted to discuss blood work results via phone but unable to reach.

## 2015-07-12 NOTE — Telephone Encounter (Signed)
LM for patient to call back. Tyrena Gohr,CMA  

## 2015-07-12 NOTE — Telephone Encounter (Signed)
Patient called and is aware of results.  She is fine with medication being called in for gout.  Will check with MD to see if patient can be scheduled on same day to follow up for this before pcp leaves.  Ok to leave details on voicemail.  Jazmin Hartsell,CMA

## 2015-07-15 ENCOUNTER — Other Ambulatory Visit: Payer: Self-pay | Admitting: Family Medicine

## 2015-07-15 DIAGNOSIS — M79674 Pain in right toe(s): Secondary | ICD-10-CM

## 2015-07-15 MED ORDER — ALLOPURINOL 100 MG PO TABS: 100.0000 mg | ORAL_TABLET | Freq: Every day | ORAL | Status: DC

## 2015-07-16 NOTE — Assessment & Plan Note (Signed)
Assessment / Plan / Recommendations: Connie Wade expressed interest in meeting with [facility] to further discuss reducing alcohol and marijuana use after her birthday. She and [facility] will follow-up when Connie Wade calls to schedule an appointment.

## 2015-07-16 NOTE — Assessment & Plan Note (Signed)
Assessment / Plan / Recommendations: Connie Wade is doing relatively well and does not wish to change her current level alcohol and marijuana use. She and [facility] decided that further IC appointments are not necessary at this time, but Connie Wade will call if she decides she would like additional help.

## 2015-07-25 ENCOUNTER — Other Ambulatory Visit: Payer: Self-pay | Admitting: Radiology

## 2015-07-26 ENCOUNTER — Ambulatory Visit (HOSPITAL_COMMUNITY)
Admission: RE | Admit: 2015-07-26 | Discharge: 2015-07-26 | Disposition: A | Discharge status: Home / Self Care | Payer: BLUE CROSS/BLUE SHIELD | Source: Ambulatory Visit | Attending: Neurosurgery | Admitting: Neurosurgery

## 2015-07-26 ENCOUNTER — Other Ambulatory Visit (HOSPITAL_COMMUNITY): Payer: Self-pay | Admitting: Neurosurgery

## 2015-07-26 DIAGNOSIS — F329 Major depressive disorder, single episode, unspecified: Secondary | ICD-10-CM | POA: Diagnosis not present

## 2015-07-26 DIAGNOSIS — I10 Essential (primary) hypertension: Secondary | ICD-10-CM | POA: Insufficient documentation

## 2015-07-26 DIAGNOSIS — B1910 Unspecified viral hepatitis B without hepatic coma: Secondary | ICD-10-CM | POA: Diagnosis not present

## 2015-07-26 DIAGNOSIS — Z48812 Encounter for surgical aftercare following surgery on the circulatory system: Secondary | ICD-10-CM | POA: Insufficient documentation

## 2015-07-26 DIAGNOSIS — Z91013 Allergy to seafood: Secondary | ICD-10-CM | POA: Insufficient documentation

## 2015-07-26 DIAGNOSIS — B20 Human immunodeficiency virus [HIV] disease: Secondary | ICD-10-CM | POA: Diagnosis not present

## 2015-07-26 DIAGNOSIS — I6031 Nontraumatic subarachnoid hemorrhage from right posterior communicating artery: Secondary | ICD-10-CM

## 2015-07-26 DIAGNOSIS — Z87891 Personal history of nicotine dependence: Secondary | ICD-10-CM | POA: Diagnosis not present

## 2015-07-26 DIAGNOSIS — D649 Anemia, unspecified: Secondary | ICD-10-CM | POA: Insufficient documentation

## 2015-07-26 DIAGNOSIS — I609 Nontraumatic subarachnoid hemorrhage, unspecified: Secondary | ICD-10-CM | POA: Diagnosis not present

## 2015-07-26 DIAGNOSIS — Z8679 Personal history of other diseases of the circulatory system: Secondary | ICD-10-CM | POA: Diagnosis not present

## 2015-07-26 DIAGNOSIS — E785 Hyperlipidemia, unspecified: Secondary | ICD-10-CM | POA: Diagnosis not present

## 2015-07-26 DIAGNOSIS — I671 Cerebral aneurysm, nonruptured: Secondary | ICD-10-CM | POA: Diagnosis not present

## 2015-07-26 HISTORY — PX: IR GENERIC HISTORICAL: IMG1180011

## 2015-07-26 LAB — CBC
HEMATOCRIT: 34.8 % — AB (ref 36.0–46.0)
HEMOGLOBIN: 11.4 g/dL — AB (ref 12.0–15.0)
MCH: 25 pg — AB (ref 26.0–34.0)
MCHC: 32.8 g/dL (ref 30.0–36.0)
MCV: 76.3 fL — ABNORMAL LOW (ref 78.0–100.0)
Platelets: 476 10*3/uL — ABNORMAL HIGH (ref 150–400)
RBC: 4.56 MIL/uL (ref 3.87–5.11)
RDW: 17.2 % — AB (ref 11.5–15.5)
WBC: 6.1 10*3/uL (ref 4.0–10.5)

## 2015-07-26 LAB — BASIC METABOLIC PANEL
ANION GAP: 6 (ref 5–15)
BUN: 9 mg/dL (ref 6–20)
CALCIUM: 9 mg/dL (ref 8.9–10.3)
CO2: 24 mmol/L (ref 22–32)
Chloride: 110 mmol/L (ref 101–111)
Creatinine, Ser: 0.67 mg/dL (ref 0.44–1.00)
GFR calc Af Amer: >60 mL/min (ref >60)
GLUCOSE: 90 mg/dL (ref 65–99)
POTASSIUM: 3.7 mmol/L (ref 3.5–5.1)
Sodium: 140 mmol/L (ref 135–145)

## 2015-07-26 LAB — PROTIME-INR
INR: 0.98 (ref 0.00–1.49)
Prothrombin Time: 13.2 seconds (ref 11.6–15.2)

## 2015-07-26 LAB — APTT: APTT: 33 s (ref 24–37)

## 2015-07-26 MED ORDER — MIDAZOLAM HCL 2 MG/2ML IJ SOLN
INTRAMUSCULAR | Status: AC
  Filled 2015-07-26: qty 2

## 2015-07-26 MED ORDER — HEPARIN SODIUM (PORCINE) 1000 UNIT/ML IJ SOLN
INTRAMUSCULAR | Status: AC
  Filled 2015-07-26: qty 2

## 2015-07-26 MED ORDER — LABETALOL HCL 5 MG/ML IV SOLN
INTRAVENOUS | Status: AC
  Filled 2015-07-26: qty 4

## 2015-07-26 MED ORDER — MIDAZOLAM HCL 2 MG/2ML IJ SOLN
INTRAMUSCULAR | Status: AC | PRN
  Administered 2015-07-26: 1 mg via INTRAVENOUS

## 2015-07-26 MED ORDER — SODIUM CHLORIDE 0.9 % IV SOLN: INTRAVENOUS | Status: DC

## 2015-07-26 MED ORDER — IOPAMIDOL (ISOVUE-300) INJECTION 61%
INTRAVENOUS | Status: AC
  Filled 2015-07-26: qty 150

## 2015-07-26 MED ORDER — IOPAMIDOL (ISOVUE-300) INJECTION 61%
INTRAVENOUS | Status: AC
  Administered 2015-07-26: 60 mL
  Filled 2015-07-26: qty 100

## 2015-07-26 MED ORDER — HYDROCODONE-ACETAMINOPHEN 5-325 MG PO TABS: 1.0000 | ORAL_TABLET | ORAL | Status: DC | PRN

## 2015-07-26 MED ORDER — LIDOCAINE HCL 1 % IJ SOLN
INTRAMUSCULAR | Status: AC
  Filled 2015-07-26: qty 20

## 2015-07-26 MED ORDER — LIDOCAINE HCL 1 % IJ SOLN
INTRAMUSCULAR | Status: AC | PRN
  Administered 2015-07-26: 10 mL

## 2015-07-26 MED ORDER — LABETALOL HCL 5 MG/ML IV SOLN
10.0000 mg | Freq: Once | INTRAVENOUS | Status: AC
  Administered 2015-07-26: 10 mg via INTRAVENOUS

## 2015-07-26 MED ORDER — FENTANYL CITRATE (PF) 100 MCG/2ML IJ SOLN
INTRAMUSCULAR | Status: AC
  Filled 2015-07-26: qty 2

## 2015-07-26 MED ORDER — SODIUM CHLORIDE 0.9 % IV SOLN
Freq: Once | INTRAVENOUS | Status: AC
  Administered 2015-07-26: 09:00:00 via INTRAVENOUS

## 2015-07-26 MED ORDER — HEPARIN SODIUM (PORCINE) 1000 UNIT/ML IJ SOLN
INTRAMUSCULAR | Status: AC | PRN
  Administered 2015-07-26: 2000 [IU] via INTRAVENOUS

## 2015-07-26 MED ORDER — FENTANYL CITRATE (PF) 100 MCG/2ML IJ SOLN
INTRAMUSCULAR | Status: AC | PRN
  Administered 2015-07-26: 25 ug via INTRAVENOUS

## 2015-07-26 NOTE — Sedation Documentation (Signed)
5 fr. Exoseal to right groin 

## 2015-07-26 NOTE — Op Note (Signed)
DIAGNOSTIC CEREBRAL ANGIOGRAM    OPERATOR:   Dr. Consuella Lose, MD  HISTORY:   The patient is a 60 y.o. yo female with a history of low-grade SAH in Nov 2016. She underwent coiling of a large right Pcom aneurysm. She recovered well and presents today for routine short-term angiogram.  APPROACH:   The technical aspects of the procedure as well as its potential risks and benefits were reviewed with the patient. These risks included but were not limited bleeding, infection, allergic reaction, damage to organs/vital structures, stroke, non-diagnostic procedure, and the catastrophic outcomes of heart attack, coma, and death. With an understanding of these risks, informed consent was obtained and witnessed.    The patient was placed in the supine position on the angiography table and the skin of right groin prepped in the usual sterile fashion. The procedure was performed under local anesthesia (1%-solution of bicarbonate-bufferred Lidoacaine) and conscious sedation with Versed and fentanyl monitored by the in-suite nurse.    A 5- French sheath was introduced in the right common femoral artery using Seldinger technique.  A fluorophase sequence was used to document the sheath position.    HEPARIN: 2000 Units total.   CONTRAST AGENT: 60cc, Omnipaque 300   FLUOROSCOPY TIME: 6.0 combined AP and lateral minutes    CATHETER(S) AND WIRE(S):    5-French JB-1 glidecatheter   0.035" glidewire    VESSELS CATHETERIZED:   Right internal carotid   Left internal carotid   Right vertebral   Left vertebral   Right common femoral  VESSELS STUDIED:   Right internal carotid, head Right vertebral Left internal carotid, head Left vertebral Right femoral  PROCEDURAL NARRATIVE:   A 5-Fr JB-1 terumo glide catheter was advanced over a 0.035 glidewire into the aortic arch. The above vessels were then sequentially catheterized and cervical/cerebral angiograms taken. After review of images, the catheter was  removed without incident.    INTERPRETATION:   Right internal carotid: head:   Injection reveals the presence of a widely patent ICA, M1, and A1 segments and their branches. Coil mass is seen at the Pcom region. In comparison to post-coiling angiogram, the minimal aneurysm neck residual is stable to decreased in size. No stenosis is seen. The parenchymal and venous phases are normal. The venous sinuses are widely patent.    Left internal carotid: head:   Injection reveals the presence of a widely patent ICA, A1, and M1 segments and their branches. Small approximately 77mm ophthalmic aneurysm is stable in size. Also seen is a small left A2-3 junction aneurysm which measures 1.4 x 1.53mm. In review of the prior angiogram, this was present and is stable in comparison. The parenchymal and venous phases are normal. The venous sinuses are widely patent.    Right vertebral:   Injection reveals the presence of a widely patent vertebral artery. This leads to a widely patent basilar artery that terminates in bilateral P1. The basilar apex is normal. There is no significant stenosis, occlusion, aneurysm, or vascular malformation visualized. The parenchymal and venous phases are normal. The venous sinuses are widely patent.    Left vertebral:    Normal vessel. No PICA aneurysm. See basilar description above.    Right femoral:    Normal vessel. No significant atherosclerotic disease. Arterial sheath in adequate position.   DISPOSITION:  Upon completion of the study, the femoral sheath was removed and hemostasis obtained using a 5-Fr ExoSeal closure device. Good proximal and distal lower extremity pulses were documented upon achievement of hemostasis.  The procedure was well tolerated and no early complications were observed.       The patient was transferred back to the holding area to be positioned flat in bed for 3 hours of observation.    IMPRESSION:  1. Stable to improved miniscule neck residual 6  months after coiling of a ruptured large right Pcom aneurysm. 2. Stable appearance of small left ophthalmic and left A2-3 junction aneurysms as described above.   The preliminary results of this procedure were shared with the patient and the patient's family.

## 2015-07-26 NOTE — H&P (Signed)
CC:  Aneurysm  HPI: Connie Wade is a 60 y.o. female with a history of subarachnoid hemorrhage in 11/16 and underwent coiling of a right Pcom aneurysm. She has done very well, and is essentially back to normal. She presents for routine f/u angiogram.  PMH: Past Medical History  Diagnosis Date  . HIV 03/25/2006    since 2000  . HEPATITIS B 03/25/2006  . HYPERLIPIDEMIA 03/25/2006  . UTERINE FIBROID 03/25/2006  . ANEMIA, OTHER, UNSPECIFIED 03/25/2006  . TOBACCO DEPENDENCE 03/25/2006  . DEPRESSION 03/17/2007  . HYPERTENSION, BENIGN ESSENTIAL 04/26/2007  . HYDRADENITIS 03/25/2006  . Tubular adenoma of colon 06/23/2011    Benign, no high grade dysplasia 06/16/11 Followed by [name] Dr. Watt Climes Repeat in 5 years (due 2018)    PSH: Past Surgical History  Procedure Laterality Date  . Ovarian cyst removal  2001  . Radiology with anesthesia N/A 12/05/2014    Procedure: RADIOLOGY WITH ANESTHESIA-COING FOR SUBARACHNOID HEMORRHAGE;  Surgeon: Consuella Lose, MD;  Location: [place];  Service: Radiology;  Laterality: N/A;    SH: Social History  Substance Use Topics  . Smoking status: Former Smoker -- 1.00 packs/day    Types: Cigarettes    Quit date: 12/17/2014  . Smokeless tobacco: Not on file  . Alcohol Use: No     Comment: everday -- not since discharge    MEDS: Prior to Admission medications   Medication Sig Start Date End Date Taking? Authorizing Provider  allopurinol (ZYLOPRIM) 100 MG tablet Take 1 tablet (100 mg total) by mouth daily. Increase to 200mg  (2 tablets) after 7 days 07/15/15  Yes Olam Idler, MD  cetirizine (ZYRTEC) 10 MG tablet Take 10 mg by mouth daily.   Yes Historical Provider, MD  fluticasone (FLONASE) 50 MCG/ACT nasal spray Place 2 sprays into both nostrils daily. 07/04/15  Yes Olam Idler, MD  lisinopril (PRINIVIL,ZESTRIL) 20 MG tablet TAKE 1 TABLET (20 MG TOTAL) BY MOUTH DAILY. 06/26/15  Yes Olam Idler, MD    ALLERGY: Allergies  Allergen Reactions  . Shrimp  [Shellfish Allergy] Hives    ROS: ROS  NEUROLOGIC EXAM: Awake, alert, oriented Memory and concentration grossly intact Speech fluent, appropriate CN grossly intact Motor exam: Upper Extremities Deltoid Bicep Tricep Grip  Right 5/5 5/5 5/5 5/5  Left 5/5 5/5 5/5 5/5   Lower Extremity IP Quad PF DF EHL  Right 5/5 5/5 5/5 5/5 5/5  Left 5/5 5/5 5/5 5/5 5/5   Sensation grossly intact to LT  IMPRESSION: - 60 y.o. female 5mo s/p coiling of ruptured right Pcom aneurysm, at baseline for routine f/u angiogram  PLAN: - Will proceed with diagnostic angiogram - Likely home post-procedure  I have reviewed the indications, risks, benefits, and alternatives to the angiogram with the patient. All questions were answered and consent was obtained.

## 2015-07-26 NOTE — Progress Notes (Signed)
Dr Kathyrn Sheriff was called, verbal consent order was given and the order will be placed by him when he arrives.

## 2015-07-26 NOTE — Discharge Instructions (Signed)
Angiogram, Care After Refer to this sheet in the next few weeks. These instructions provide you with information about caring for yourself after your procedure. Your health care provider may also give you more specific instructions. Your treatment has been planned according to current medical practices, but problems sometimes occur. Call your health care provider if you have any problems or questions after your procedure. WHAT TO EXPECT AFTER THE PROCEDURE After your procedure, it is typical to have the following:  Bruising at the catheter insertion site that usually fades within 1-2 weeks.  Blood collecting in the tissue (hematoma) that may be painful to the touch. It should usually decrease in size and tenderness within 1-2 weeks. HOME CARE INSTRUCTIONS  Take medicines only as directed by your health care provider.  You may shower 24-48 hours after the procedure or as directed by your health care provider. Remove the bandage (dressing) and gently wash the site with plain soap and water. Pat the area dry with a clean towel. Do not rub the site, because this may cause bleeding.  Do not take baths, swim, or use a hot tub until your health care provider approves.  Check your insertion site every day for redness, swelling, or drainage.  Do not apply powder or lotion to the site.  Do not lift over 10 lb (4.5 kg) for 5 days after your procedure or as directed by your health care provider.  Ask your health care provider when it is okay to:  Return to work or school.  Resume usual physical activities or sports.  Resume sexual activity.  Do not drive home if you are discharged the same day as the procedure. Have someone else drive you.  You may drive 24 hours after the procedure unless otherwise instructed by your health care provider.  Do not operate machinery or power tools for 24 hours after the procedure or as directed by your health care provider.  If your procedure was done as an  outpatient procedure, which means that you went home the same day as your procedure, a responsible adult should be with you for the first 24 hours after you arrive home.  Keep all follow-up visits as directed by your health care provider. This is important. SEEK MEDICAL CARE IF:  You have a fever.  You have chills.  You have increased bleeding from the catheter insertion site. Hold pressure on the site. SEEK IMMEDIATE MEDICAL CARE IF:  You have unusual pain at the catheter insertion site.  You have redness, warmth, or swelling at the catheter insertion site.  You have drainage (other than a small amount of blood on the dressing) from the catheter insertion site.  The catheter insertion site is bleeding, and the bleeding does not stop after 30 minutes of holding steady pressure on the site.  The area near or just beyond the catheter insertion site becomes pale, cool, tingly, or numb.   This information is not intended to replace advice given to you by your health care provider. Make sure you discuss any questions you have with your health care provider.   Document Released: 07/31/2004 Document Revised: 02/02/2014 Document Reviewed: 06/15/2012 Elsevier Interactive Patient Education 2016 [place] from Work, [employer], or Physical Activity _____________________________________________Deborah Donnell_____ needs to be excused from: __*___ Work _____ [employer] _____ Physical activity beginning now and through the following date: ____7/2/17________________. __*___ He or she may return to work or school but should still avoid the following physical activity or activities from now until  _________5 days___________. Activity restrictions include: ____*_ Lifting more than ___10____ lb _____ Sitting longer than __________ minutes at a time _____ Standing longer than ________ minutes at a time _____ He or she may return to full physical activity as of ____________________. Health Care  Provider Name (printed): __Dr Nundkumar______________________________________  Health Care Provider (signature): ___________________________________________ Date: _______6/30/17_________   This information is not intended to replace advice given to you by your health care provider. Make sure you discuss any questions you have with your health care provider.   Document Released: 07/08/2000 Document Revised: 02/02/2014 Document Reviewed: 08/14/2013 Elsevier Interactive Patient Education [employer].

## 2015-08-05 ENCOUNTER — Other Ambulatory Visit: Payer: Self-pay | Admitting: Family Medicine

## 2015-08-05 NOTE — Telephone Encounter (Signed)
Medication refilled

## 2015-08-07 ENCOUNTER — Encounter (HOSPITAL_COMMUNITY): Payer: Self-pay | Admitting: Emergency Medicine

## 2015-08-07 ENCOUNTER — Ambulatory Visit (INDEPENDENT_AMBULATORY_CARE_PROVIDER_SITE_OTHER): Payer: BLUE CROSS/BLUE SHIELD

## 2015-08-07 ENCOUNTER — Ambulatory Visit (HOSPITAL_COMMUNITY)
Admission: EM | Admit: 2015-08-07 | Discharge: 2015-08-07 | Disposition: A | Discharge status: Home / Self Care | Payer: BLUE CROSS/BLUE SHIELD | Attending: Family Medicine | Admitting: Family Medicine

## 2015-08-07 DIAGNOSIS — M19041 Primary osteoarthritis, right hand: Secondary | ICD-10-CM | POA: Diagnosis not present

## 2015-08-07 MED ORDER — MELOXICAM 7.5 MG PO TABS: 7.5000 mg | ORAL_TABLET | Freq: Two times a day (BID) | ORAL | Status: DC

## 2015-08-07 NOTE — ED Provider Notes (Addendum)
CSN: [id number]     Arrival date & time 08/07/15  1508 History   First MD Initiated Contact with Patient 08/07/15 1542     Chief Complaint  Patient presents with  . Hand Pain   (Consider location/radiation/quality/duration/timing/severity/associated sxs/prior Treatment) Patient is a 60 y.o. female presenting with hand pain. The history is provided by the patient.  Hand Pain This is a new problem. The current episode started more than 1 week ago. The problem has been gradually worsening. The symptoms are aggravated by bending.    Past Medical History  Diagnosis Date  . HIV 03/25/2006    since 2000  . HEPATITIS B 03/25/2006  . HYPERLIPIDEMIA 03/25/2006  . UTERINE FIBROID 03/25/2006  . ANEMIA, OTHER, UNSPECIFIED 03/25/2006  . TOBACCO DEPENDENCE 03/25/2006  . DEPRESSION 03/17/2007  . HYPERTENSION, BENIGN ESSENTIAL 04/26/2007  . HYDRADENITIS 03/25/2006  . Tubular adenoma of colon 06/23/2011    Benign, no high grade dysplasia 06/16/11 Followed by [name] Dr. Watt Climes Repeat in 5 years (due 2018)   Past Surgical History  Procedure Laterality Date  . Ovarian cyst removal  2001  . Radiology with anesthesia N/A 12/05/2014    Procedure: RADIOLOGY WITH ANESTHESIA-COING FOR SUBARACHNOID HEMORRHAGE;  Surgeon: Consuella Lose, MD;  Location: [facility];  Service: Radiology;  Laterality: N/A;   Family History  Problem Relation Age of Onset  . COPD Sister   . Arthritis Mother   . Alcohol abuse Father    Social History  Substance Use Topics  . Smoking status: Former Smoker -- 1.00 packs/day    Types: Cigarettes    Quit date: 12/17/2014  . Smokeless tobacco: None  . Alcohol Use: No     Comment: everday -- not since discharge   OB History    No data available     Review of Systems  Constitutional: Negative.   Musculoskeletal: Positive for joint swelling. Negative for gait problem.  All other systems reviewed and are negative.   Allergies  Shrimp  Home Medications   Prior to Admission  medications   Medication Sig Start Date End Date Taking? Authorizing Provider  allopurinol (ZYLOPRIM) 100 MG tablet Take 1 tablet (100 mg total) by mouth daily. Increase to 200mg  (2 tablets) after 7 days 07/15/15  Yes Olam Idler, MD  lisinopril (PRINIVIL,ZESTRIL) 20 MG tablet TAKE 1 TABLET EVERY DAY 08/05/15  Yes Sela Hua, MD  cetirizine (ZYRTEC) 10 MG tablet Take 10 mg by mouth daily.    Historical Provider, MD  fluticasone (FLONASE) 50 MCG/ACT nasal spray Place 2 sprays into both nostrils daily. 07/04/15   Olam Idler, MD  meloxicam (MOBIC) 7.5 MG tablet Take 1 tablet (7.5 mg total) by mouth 2 (two) times daily after a meal. 08/07/15   Billy Fischer, MD   Meds Ordered and Administered this Visit  Medications - No data to display  BP 122/81 mmHg  Pulse 74  Temp(Src) 98.2 F (36.8 C) (Oral)  Resp 12  SpO2 99% No data found.   Physical Exam  Constitutional: She is oriented to person, place, and time. She appears well-developed and well-nourished. No distress.  Musculoskeletal: She exhibits tenderness.       Hands: Neurological: She is alert and oriented to person, place, and time.  Skin: Skin is warm and dry.  Nursing note and vitals reviewed.   ED Course  Procedures (including critical care time)  Labs Review Labs Reviewed - No data to display  Imaging Review Dg Hand Complete Right  08/07/2015  CLINICAL DATA:  Pain, swelling at base of thumb EXAM: RIGHT HAND - COMPLETE 3+ VIEW COMPARISON:  None. FINDINGS: Moderate degenerative changes at the first carpometacarpal joint with joint space narrowing and spurring. Mild joint space narrowing in the IP joints. No bony erosions. No acute bony abnormality. Specifically, no fracture, subluxation, or dislocation. Soft tissues are intact. IMPRESSION: Moderate degenerative changes at the first carpometacarpal joint. Early osteoarthritis in the DIP joints. No acute findings. Electronically Signed   By: Rolm Baptise M.D.   On:  08/07/2015 16:34   X-rays reviewed and report per radiologist.   Visual Acuity Review  Right Eye Distance:   Left Eye Distance:   Bilateral Distance:    Right Eye Near:   Left Eye Near:    Bilateral Near:         MDM   1. Primary osteoarthritis of right hand        Billy Fischer, MD 08/07/15 [place], MD 08/07/15 1650

## 2015-08-07 NOTE — ED Notes (Signed)
Reports swelling and pain to the base of right thumb and hand.  No known injury.  Able to move finger, but painful.  Patient reports she is taking allopurinol for gout in right great toe.

## 2015-08-07 NOTE — Discharge Instructions (Signed)
Wear splint and use medicine as needed, see your doctor if further problems.

## 2015-08-30 ENCOUNTER — Emergency Department (HOSPITAL_COMMUNITY)
Admission: EM | Admit: 2015-08-30 | Discharge: 2015-08-30 | Disposition: A | Discharge status: Home / Self Care | Payer: BLUE CROSS/BLUE SHIELD | Attending: Emergency Medicine | Admitting: Emergency Medicine

## 2015-08-30 ENCOUNTER — Emergency Department (HOSPITAL_COMMUNITY): Payer: BLUE CROSS/BLUE SHIELD

## 2015-08-30 ENCOUNTER — Ambulatory Visit (INDEPENDENT_AMBULATORY_CARE_PROVIDER_SITE_OTHER): Payer: BLUE CROSS/BLUE SHIELD | Admitting: Internal Medicine

## 2015-08-30 ENCOUNTER — Encounter: Payer: Self-pay | Admitting: Internal Medicine

## 2015-08-30 ENCOUNTER — Encounter (HOSPITAL_COMMUNITY): Payer: Self-pay | Admitting: Emergency Medicine

## 2015-08-30 DIAGNOSIS — R51 Headache: Secondary | ICD-10-CM

## 2015-08-30 DIAGNOSIS — Z79899 Other long term (current) drug therapy: Secondary | ICD-10-CM | POA: Diagnosis not present

## 2015-08-30 DIAGNOSIS — Z87891 Personal history of nicotine dependence: Secondary | ICD-10-CM | POA: Diagnosis not present

## 2015-08-30 DIAGNOSIS — R519 Headache, unspecified: Secondary | ICD-10-CM

## 2015-08-30 DIAGNOSIS — I10 Essential (primary) hypertension: Secondary | ICD-10-CM | POA: Insufficient documentation

## 2015-08-30 MED ORDER — NAPROXEN 500 MG PO TABS: 500.0000 mg | ORAL_TABLET | Freq: Two times a day (BID) | ORAL | 0 refills | Status: DC

## 2015-08-30 NOTE — ED Provider Notes (Signed)
[facility] DEPT Provider Note   CSN: [id number] Arrival date & time: 08/30/15  1628  First Provider Contact:  None       History   Chief Complaint Chief Complaint  Patient presents with  . Migraine    HPI Connie Wade is a 60 y.o. female.  HPI   60 year old female with history of cerebral aneurysm diagnosed in 2016 presenting to the ED today with complaints of headache. Patient has had persistent headache ongoing for the past 2 weeks. She described the headache as a throbbing pain to the occiput that radiates forward. Headaches worse in the morning when she first wakes up. Palpation of the scalp makes it worse, using marijuana has helped. Patient also admits to drinking at least two 16oz beers daily for the past week. He is a former smoker and denies any other drug use. She admits to having increasing stress at an unspecified reason. Patient was seen at the PCP office this afternoon for her complaint and was sent to the ED for a CT scan for further evaluation.  Patient denies having any other symptoms including no numbness, weakness, neck pain, chest pain, shortness of breath, abdominal pain, back pain, bowel bladder incontinence, focal weakness, difficulty thinking, having fever, chills, vision changes, neck stiffness or rash  Past Medical History:  Diagnosis Date  . ANEMIA, OTHER, UNSPECIFIED 03/25/2006  . DEPRESSION 03/17/2007  . HEPATITIS B 03/25/2006  . HIV 03/25/2006   since 2000  . HYDRADENITIS 03/25/2006  . HYPERLIPIDEMIA 03/25/2006  . HYPERTENSION, BENIGN ESSENTIAL 04/26/2007  . TOBACCO DEPENDENCE 03/25/2006  . Tubular adenoma of colon 06/23/2011   Benign, no high grade dysplasia 06/16/11 Followed by [name] Dr. Watt Climes Repeat in 5 years (due 2018)  . UTERINE FIBROID 03/25/2006    Patient Active Problem List   Diagnosis Date Noted  . Headache 08/30/2015  . Pain of right great toe 04/25/2015  . Hallux rigidus of right foot 04/25/2015  . Thoracic back pain 01/11/2015    . Subarachnoid hemorrhage due to ruptured aneurysm ([place]) 12/04/2014  . Sudden onset of severe headache   . Ruptured cerebral aneurysm not due to trauma ([place])   . Low back pain 03/07/2013  . Tobacco abuse 09/19/2012  . Loose stools 04/27/2012  . Tubular adenoma of colon 06/23/2011  . Anterior chest wall pain 06/27/2010  . HYPERTENSION, BENIGN ESSENTIAL 04/26/2007  . Depression 03/17/2007  . Degenerative disc disease, lumbar 03/17/2007  . Human immunodeficiency virus (HIV) disease ([place]) 03/25/2006  . HEPATITIS B 03/25/2006  . UTERINE FIBROID 03/25/2006  . HLD (hyperlipidemia) 03/25/2006  . ANEMIA, OTHER, UNSPECIFIED 03/25/2006  . Tobacco abuse counseling 03/25/2006  . Allergic rhinitis 03/25/2006  . OVARIAN CYST 03/25/2006  . MENOPAUSAL SYNDROME 03/25/2006    Past Surgical History:  Procedure Laterality Date  . OVARIAN CYST REMOVAL  2001  . RADIOLOGY WITH ANESTHESIA N/A 12/05/2014   Procedure: RADIOLOGY WITH ANESTHESIA-COING FOR SUBARACHNOID HEMORRHAGE;  Surgeon: Consuella Lose, MD;  Location: [facility];  Service: Radiology;  Laterality: N/A;    OB History    No data available       Home Medications    Prior to Admission medications   Medication Sig Start Date End Date Taking? Authorizing Provider  allopurinol (ZYLOPRIM) 100 MG tablet Take 1 tablet (100 mg total) by mouth daily. Increase to 200mg  (2 tablets) after 7 days 07/15/15   Olam Idler, MD  cetirizine (ZYRTEC) 10 MG tablet Take 10 mg by mouth daily.    Historical Provider,  MD  fluticasone (FLONASE) 50 MCG/ACT nasal spray Place 2 sprays into both nostrils daily. 07/04/15   Olam Idler, MD  lisinopril (PRINIVIL,ZESTRIL) 20 MG tablet TAKE 1 TABLET EVERY DAY 08/05/15   Sela Hua, MD  meloxicam (MOBIC) 7.5 MG tablet Take 1 tablet (7.5 mg total) by mouth 2 (two) times daily after a meal. 08/07/15   Billy Fischer, MD    Family History Family History  Problem Relation Age of Onset  . COPD Sister   . Arthritis  Mother   . Alcohol abuse Father     Social History Social History  Substance Use Topics  . Smoking status: Former Smoker    Packs/day: 1.00    Types: Cigarettes    Quit date: 12/17/2014  . Smokeless tobacco: Not on file  . Alcohol use No     Comment: everday -- not since discharge     Allergies   Shrimp [shellfish allergy]   Review of Systems Review of Systems  All other systems reviewed and are negative.    Physical Exam Updated Vital Signs BP 184/90   Pulse 73   Temp 98.2 F (36.8 C) (Oral)   Resp 18   Wt 63.6 kg   SpO2 100%   BMI 26.51 kg/m   Physical Exam  Constitutional: She is oriented to person, place, and time. She appears well-developed and well-nourished. No distress.  African-American female sitting in bed in no acute discomfort, nontoxic in appearance  HENT:  Head: Atraumatic.  Right Ear: External ear normal.  Left Ear: External ear normal.  Nose: Nose normal.  Mouth/Throat: Oropharynx is clear and moist.  Eyes: Conjunctivae and EOM are normal. Pupils are equal, round, and reactive to light.  Neck: Normal range of motion. Neck supple.  No nuchal rigidity, mild tenderness noted to the posterior cervical region without any overlying skin changes. Neck with full range of motion.  Cardiovascular: Normal rate and regular rhythm.   Pulmonary/Chest: Effort normal and breath sounds normal.  Abdominal: Soft. Bowel sounds are normal.  Neurological: She is alert and oriented to person, place, and time.  Neurologic exam:  Speech clear, pupils equal round reactive to light, extraocular movements intact  Normal peripheral visual fields Cranial nerves III through XII normal including no facial droop Follows commands, moves all extremities x4, normal strength to bilateral upper and lower extremities at all major muscle groups including grip Sensation normal to light touch  Coordination intact, no limb ataxia, finger-nose-finger normal Rapid alternating  movements normal No pronator drift Gait normal   Skin: No rash noted.  Psychiatric: She has a normal mood and affect.  Nursing note and vitals reviewed.    ED Treatments / Results  Labs (all labs ordered are listed, but only abnormal results are displayed) Labs Reviewed - No data to display  EKG  EKG Interpretation None       Radiology Ct Head Wo Contrast  Result Date: 08/30/2015 CLINICAL DATA:  Headache for 2 weeks. Light sensitivity. History of aneurysm coiling. EXAM: CT HEAD WITHOUT CONTRAST TECHNIQUE: Contiguous axial images were obtained from the base of the skull through the vertex without intravenous contrast. COMPARISON:  12/04/2014 FINDINGS: Prior aneurysm coiling of a right cerebral aneurysm. Beam hardening artifact from the coils. No evidence of hemorrhage. No hydrocephalus or acute infarction. No midline shift. No acute calvarial abnormality. Visualized paranasal sinuses and mastoids clear. Orbital soft tissues unremarkable. IMPRESSION: No acute intracranial abnormality. Electronically Signed   By: Rolm Baptise M.D.  On: 08/30/2015 16:56    Procedures Procedures (including critical care time)  Medications Ordered in ED Medications - No data to display   Initial Impression / Assessment and Plan / ED Course  I have reviewed the triage vital signs and the nursing notes.  Pertinent labs & imaging results that were available during my care of the patient were reviewed by me and considered in my medical decision making (see chart for details).  Clinical Course    BP 178/85 (BP Location: Right Arm)   Pulse 70   Temp 98 F (36.7 C) (Oral)   Resp 12   Wt 63.6 kg   SpO2 99%   BMI 26.51 kg/m  The patient was noted to be hypertensive today in the emergency department. I have spoken with the patient regarding hypertension and the need for improved management. I instructed the patient to followup with the Primary care doctor within 4 days to improve the management of  the patient's hypertension. I also counseled the patient regarding the signs and symptoms which would require an emergent visit to an emergency department for hypertensive urgency and/or hypertensive emergency. The patient understood the need for improved hypertensive management.   Final Clinical Impressions(s) / ED Diagnoses   Final diagnoses:  Recurrent occipital headache    New Prescriptions New Prescriptions   NAPROXEN (NAPROSYN) 500 MG TABLET    Take 1 tablet (500 mg total) by mouth 2 (two) times daily.   7:43 PM Patient with persistent posterior occipital headache for 2 weeks. History of subarachnoid hemorrhage secondary to aneurysm in the past. History of HIV. She is well-appearing without any focal neuro deficit and headache is minimal at this time. Headache is reproducible when palpating the occiput of her scalp. I suspect this may be cervicogenic headache. Head CT scan is unremarkable, I felt patient can follow-up closely with her Provider for further management of condition. She discharge with NSAIDs. Return precaution discussed.    Domenic Moras, PA-C 08/30/15 [place], MD 08/31/15 [phone]

## 2015-08-30 NOTE — Assessment & Plan Note (Signed)
Due to patient's history of recent aneurysm rupture in November 2016. With new onset of headache for 2 weeks that is constant dull occiput pain not relieved by Tylenol will have patient go to the ED for emergent imaging. Recommended that ED gets a head CT and work up for possible rebleed. Patient was transported over by office staff. Triage staff was called to alert of arrival of patient and the need for workup.

## 2015-08-30 NOTE — Progress Notes (Signed)
   [facility] Kerrin Mo, MD Phone: [phone]  Reason For Visit: Headache   # Patient presenting with a headache of 2 weeks onset. She states that it is a dull ache that is in her occiput. Patient previously has had an aneurysm ruptured in November 2016, and that was the first time she really had a bad headache. Otherwise has no history of headache. Patient can take Tylenol which resolves the headache for about 30 minutes but then headache returns immediately afterwards. It hasn't worsened in severity but is continuous. States that it started after patient had angiogram to recheck on aneurysm on June 30. No vision changes over the last two weeks. Patient is able to sleep, but has to [facility] to help with this, then wakes up after 3 hours with headache again . No changes in sensation or focal weakness. No worsening of pain radiating down the neck. No fever, chills, no neck stiffness. No nausea. No vomitting. No worsening of headache with position changes.    Past Medical History - Hx of ruptured aneurysm  Reviewed problem list.  Medications- reviewed and updated No additions to family history Social history- Smokes THC daily   Objective: BP (!) 159/83   Pulse 74   Temp 98.2 F (36.8 C) (Oral)   Ht 5\' 1"  (1.549 m)   Wt 140 lb 6.4 oz (63.7 kg)   BMI 26.53 kg/m  Gen: NAD, alert, cooperative with exam Neuro Exam : Cn 2-7 intact, unable to determine if papilledema present on exam  Strength equal & normal in upper & lower extremities. Sensation normal in upper and lower extremities, + 1 reflexes in patellar/achilles and bicep tendon Balance normal  Romberg normal, finger to nose, heel to toe normal   Assessment/Plan: See problem based a/p  Headache Due to patient's history of recent aneurysm rupture in November 2016. With new onset of headache for 2 weeks that is constant dull occiput pain not relieved by Tylenol will have patient go to the ED for emergent  imaging. Recommended that ED gets a head CT and work up for possible rebleed. Patient was transported over by office staff. Triage staff was called to alert of arrival of patient and the need for workup.

## 2015-08-30 NOTE — ED Triage Notes (Signed)
Pt sts HA x 2 weeks with generalized pain; pt sts hx of aneurysm so sent here for CT scan

## 2015-08-30 NOTE — Discharge Instructions (Signed)
You have been evaluated for your headache.  Your head CT scan today did not show any concerning finding.  Please take Naproxen as needed for pain. Please followup with your doctor for further care.

## 2015-09-09 ENCOUNTER — Other Ambulatory Visit: Payer: Self-pay | Admitting: *Deleted

## 2015-09-09 DIAGNOSIS — M79674 Pain in right toe(s): Secondary | ICD-10-CM

## 2015-09-09 MED ORDER — ALLOPURINOL 100 MG PO TABS: 100.0000 mg | ORAL_TABLET | Freq: Every day | ORAL | 1 refills | Status: DC

## 2015-09-09 NOTE — Telephone Encounter (Signed)
Medication refilled

## 2015-10-25 ENCOUNTER — Encounter (HOSPITAL_COMMUNITY): Payer: Self-pay | Admitting: Neurosurgery

## 2015-12-05 ENCOUNTER — Other Ambulatory Visit: Payer: Self-pay | Admitting: *Deleted

## 2015-12-05 DIAGNOSIS — M79674 Pain in right toe(s): Secondary | ICD-10-CM

## 2015-12-05 NOTE — Telephone Encounter (Signed)
Refill request for 90 day supply.  Martin, Tamika L, RN  

## 2015-12-06 MED ORDER — ALLOPURINOL 100 MG PO TABS: 100.0000 mg | ORAL_TABLET | Freq: Every day | ORAL | 0 refills | Status: DC

## 2015-12-06 NOTE — Telephone Encounter (Signed)
Please let Connie Wade know that I have refilled her Allopurinol for a 90 day supply

## 2015-12-06 NOTE — Telephone Encounter (Signed)
Left detailed message informing pt her prescription was renewed.

## 2015-12-14 ENCOUNTER — Other Ambulatory Visit: Payer: Self-pay | Admitting: Internal Medicine

## 2016-01-13 ENCOUNTER — Other Ambulatory Visit: Payer: Self-pay | Admitting: Family Medicine

## 2016-01-17 ENCOUNTER — Ambulatory Visit (INDEPENDENT_AMBULATORY_CARE_PROVIDER_SITE_OTHER): Payer: BLUE CROSS/BLUE SHIELD | Admitting: Internal Medicine

## 2016-01-17 ENCOUNTER — Emergency Department (HOSPITAL_COMMUNITY)
Admission: EM | Admit: 2016-01-17 | Discharge: 2016-01-17 | Disposition: A | Discharge status: Home / Self Care | Payer: BLUE CROSS/BLUE SHIELD | Attending: Emergency Medicine | Admitting: Emergency Medicine

## 2016-01-17 ENCOUNTER — Other Ambulatory Visit: Payer: Self-pay | Admitting: Internal Medicine

## 2016-01-17 ENCOUNTER — Encounter: Payer: Self-pay | Admitting: Internal Medicine

## 2016-01-17 ENCOUNTER — Encounter (HOSPITAL_COMMUNITY): Payer: Self-pay | Admitting: Emergency Medicine

## 2016-01-17 VITALS — BP 120/85 | HR 72 | Temp 98.1°F | Ht 61.0 in | Wt 131.0 lb

## 2016-01-17 DIAGNOSIS — M25511 Pain in right shoulder: Secondary | ICD-10-CM | POA: Insufficient documentation

## 2016-01-17 DIAGNOSIS — I10 Essential (primary) hypertension: Secondary | ICD-10-CM | POA: Insufficient documentation

## 2016-01-17 DIAGNOSIS — Z23 Encounter for immunization: Secondary | ICD-10-CM | POA: Diagnosis not present

## 2016-01-17 DIAGNOSIS — Z87891 Personal history of nicotine dependence: Secondary | ICD-10-CM | POA: Insufficient documentation

## 2016-01-17 DIAGNOSIS — L7621 Postprocedural hemorrhage and hematoma of skin and subcutaneous tissue following a dermatologic procedure: Secondary | ICD-10-CM

## 2016-01-17 DIAGNOSIS — Z1231 Encounter for screening mammogram for malignant neoplasm of breast: Secondary | ICD-10-CM

## 2016-01-17 DIAGNOSIS — L989 Disorder of the skin and subcutaneous tissue, unspecified: Secondary | ICD-10-CM | POA: Diagnosis not present

## 2016-01-17 DIAGNOSIS — Z0000 Encounter for general adult medical examination without abnormal findings: Secondary | ICD-10-CM | POA: Insufficient documentation

## 2016-01-17 DIAGNOSIS — B078 Other viral warts: Secondary | ICD-10-CM | POA: Diagnosis not present

## 2016-01-17 MED ORDER — LIDOCAINE-EPINEPHRINE (PF) 2 %-1:200000 IJ SOLN
5.0000 mL | Freq: Once | INTRAMUSCULAR | Status: AC
  Administered 2016-01-17: 5 mL
  Filled 2016-01-17: qty 20

## 2016-01-17 NOTE — Progress Notes (Signed)
[facility] Phone: [phone]  Subjective:  Connie Wade is a 60 year old female presenting to clinic for follow-up of HTN, right shoulder pain, and a spot on her back.  HTN: Taking Lisinopril 20mg  daily. Does not check BP at home. No med side effects. No orthostatic hypotension, no chest pain, no lower extremity edema.  Right Shoulder Pain: Has been going on for a couple of months. She has a difficult time locating the pain because she feels like it's "all over". The pain does not radiate down her arm. The pain is "achy". The pain is worse with laying on her right side. She has tried taking Tylenol, Absorbine Junior, and pain patches, all of which have helped. No swelling, no erythema, no fevers, no chills.  Spot on Back: She noticed a spot in the middle of her back near her bra clasp. She thought the bump was a blackhead. She had her cousin squeeze it a few weeks ago, but nothing came out. She feels like the bump got bigger after her cousin messed with it. The spot has been "sore" and "itchy". No spreading redness, no drainage.  ROS: See HPI for pertinent positives and negatives  Past Medical History- HTN, hx ruptured cerebral aneurysm, HIV, HLD, tobacco use, depression  Family history reviewed for today's visit. No changes.  Social history- patient is a former smoker  Objective: BP 120/85 (BP Location: Left Arm, Patient Position: Sitting, Cuff Size: Normal)   Pulse 72   Temp 98.1 F (36.7 C) (Oral)   Ht 5\' 1"  (1.549 m)   Wt 131 lb (59.4 kg)   SpO2 99%   BMI 24.75 kg/m  Gen: NAD, alert, cooperative with exam HEENT: NCAT, EOMI, MMM Neck: FROM, supple CV: RRR, no murmur Resp: CTABL, no wheezes, normal work of breathing Right Shoulder: No edema, erythema, or gross deformity; full ROM without pain; no tenderness to palpation except for directly over biceps tendon; Speed's test mildly positive; Neer's test negative, Hawkin's test negative, empty can test  negative, O'Brien's test negative, drop arm test negative Neuro: Alert and oriented, no gross deficits, 5/5 strength in upper extremities bilaterally Skin: Small circular lesion present in the middle of the back, small piece of tissue protruding out of the center of the lesion, underlying circular area of induration measuring ~2cm in diameter, no surrounding erythema, no drainage present  Assessment/Plan: HTN: Well-controlled. BP 120/85 in clinic today. Goal <140/90.   - Continue Lisinopril 20mg  daily - Can recheck kidney function at next visit - Follow-up in 3 months  Right Shoulder Pain: Has been going on for a few months. No trauma. Exam is completely normal except for tenderness over the biceps tendon and mildly positive Speed's test. Think this is likely biceps tendonitis. Less concerned for rotator cuff tear or tendinopathy because all her special testing is normal. - Pt advised to use heat and Aspercreme to the right shoulder - Pt given handout with biceps tendonitis exercises - If not improving after 4 weeks, can consider getting x-ray or sending to formal PT  Lesion on Back: Unusual in appearance, but think this is likely a benign skin lesion. Not consistent with an abscess. - Specimen obtained and path sent - Will call patient with results - See procedure note below - Precepted with Dr. Andria Frames  Health Care Maintenance: - Received flu shot in clinic - Pt given information for mammogram - Pt declines pap today, but will call to schedule an appointment to have this done  Procedure Note: Procedure: Skin lesion removal Skin of the mid back was cleaned with alcohol. 1% Lidocaine without epinephrine used for anesthetic. Skin lesion grasped with forceps and lanced with scalpel. Hemostasis was obtained by pressure and pressure dressing was applied to the area. Wound care discussed. The specimen was labeled and sent to pathology for evaluation. The procedure was well tolerated without  complications.   Hyman Bible, MD PGY-2

## 2016-01-17 NOTE — Patient Instructions (Signed)
It was so nice to meet you!  I think your shoulder pain is caused by irritation of your biceps tendon. I have given you a handout with exercises to try at home. You should use heat to the shoulder before doing the exercises. If your pain does not get better, we may need to get some x-rays or send you to physical therapy.  For the spot on your back, we are going to send a sample to the lab so that we can see what it is. I will call you with these results. It may take a few weeks to come back.  Please continue to take the Lisinopril for your blood pressures.  I have given you a card with the number to call to have a mammogram done. Please have this performed as soon as possible.  We will see you back in 3 months for a pap smear and blood pressure check.  -Dr. Brett Albino

## 2016-01-17 NOTE — Assessment & Plan Note (Addendum)
Unusual in appearance, but think this is likely a benign skin lesion. Not consistent with an abscess. - Specimen obtained and path sent - Will call patient with results - See procedure note below - Precepted with Dr. Andria Frames

## 2016-01-17 NOTE — Assessment & Plan Note (Signed)
Has been going on for a few months. No trauma. Exam is completely normal except for tenderness over the biceps tendon and mildly positive Speed's test. Think this is likely biceps tendonitis. Less concerned for rotator cuff tear or tendinopathy because all her special testing is normal. - Pt advised to use heat and Aspercreme to the right shoulder - Pt given handout with biceps tendonitis exercises - If not improving after 4 weeks, can consider getting x-ray or sending to formal PT

## 2016-01-17 NOTE — Assessment & Plan Note (Signed)
-   Received flu shot in clinic - Pt given information for mammogram - Pt declines pap today, but will call to schedule an appointment to have this done

## 2016-01-17 NOTE — ED Triage Notes (Signed)
Pt presents to ED for assessment of bleeding from a skin biopsy on her back.  Pt sts she has bled through three shirts today.  Pt denies any injury after procedure.

## 2016-01-17 NOTE — ED Notes (Signed)
Pt understood dc material. NAD noted. 

## 2016-01-17 NOTE — ED Provider Notes (Signed)
[facility] DEPT Provider Note   CSN: [id number] Arrival date & time: 01/17/16  2154   By signing my name below, I, Delton Prairie, attest that this documentation has been prepared under the direction and in the presence of  Etta Quill, NP. Electronically Signed: Delton Prairie, ED Scribe. 01/17/16. 10:55 PM.   History   Chief Complaint Chief Complaint  Patient presents with  . Post-op Problem   The history is provided by the patient. No language interpreter was used.   HPI Comments:  Connie Wade is a 60 y.o. female, with a hx of anemia and HTN, who presents to the Emergency Department complaining of sudden onset, continued bleeding from a biopsy puncture s/p a skin biopsy which she had done today. She has taken tylenol for her pain with little relief. Pt denies any blood thinner use, a hx of clotting issues, any other associated symptoms and modifying factors at this time.    Past Medical History:  Diagnosis Date  . ANEMIA, OTHER, UNSPECIFIED 03/25/2006  . DEPRESSION 03/17/2007  . HEPATITIS B 03/25/2006  . HIV 03/25/2006   since 2000  . HYDRADENITIS 03/25/2006  . HYPERLIPIDEMIA 03/25/2006  . HYPERTENSION, BENIGN ESSENTIAL 04/26/2007  . TOBACCO DEPENDENCE 03/25/2006  . Tubular adenoma of colon 06/23/2011   Benign, no high grade dysplasia 06/16/11 Followed by [name] Dr. Watt Climes Repeat in 5 years (due 2018)  . UTERINE FIBROID 03/25/2006    Patient Active Problem List   Diagnosis Date Noted  . Right shoulder pain 01/17/2016  . Back skin lesion 01/17/2016  . Health care maintenance 01/17/2016  . Subarachnoid hemorrhage due to ruptured aneurysm ([place]) 12/04/2014  . Tobacco abuse 09/19/2012  . Tubular adenoma of colon 06/23/2011  . HYPERTENSION, BENIGN ESSENTIAL 04/26/2007  . Depression 03/17/2007  . Degenerative disc disease, lumbar 03/17/2007  . Human immunodeficiency virus (HIV) disease ([place]) 03/25/2006  . HEPATITIS B 03/25/2006  . UTERINE FIBROID 03/25/2006  . HLD  (hyperlipidemia) 03/25/2006  . ANEMIA, OTHER, UNSPECIFIED 03/25/2006  . Allergic rhinitis 03/25/2006  . MENOPAUSAL SYNDROME 03/25/2006    Past Surgical History:  Procedure Laterality Date  . IR GENERIC HISTORICAL  07/26/2015   IR ANGIO INTRA EXTRACRAN SEL INTERNAL CAROTID BILAT MOD SED 07/26/2015 Consuella Lose, MD [facility]  . IR GENERIC HISTORICAL  07/26/2015   IR ANGIO VERTEBRAL SEL VERTEBRAL BILAT MOD SED 07/26/2015 Consuella Lose, MD [facility]  . OVARIAN CYST REMOVAL  2001  . RADIOLOGY WITH ANESTHESIA N/A 12/05/2014   Procedure: RADIOLOGY WITH ANESTHESIA-COING FOR SUBARACHNOID HEMORRHAGE;  Surgeon: Consuella Lose, MD;  Location: [facility];  Service: Radiology;  Laterality: N/A;    OB History    No data available       Home Medications    Prior to Admission medications   Medication Sig Start Date End Date Taking? Authorizing Provider  allopurinol (ZYLOPRIM) 100 MG tablet Take 1 tablet (100 mg total) by mouth daily. Increase to 200mg  (2 tablets) after 7 days 12/06/15   Sela Hua, MD  cetirizine (ZYRTEC) 10 MG tablet Take 10 mg by mouth daily.    Historical Provider, MD  fluticasone (FLONASE) 50 MCG/ACT nasal spray PLACE 2 SPRAYS INTO BOTH NOSTRILS DAILY. 01/15/16 07/13/16  Sela Hua, MD  lisinopril (PRINIVIL,ZESTRIL) 20 MG tablet TAKE 1 TABLET EVERY DAY 12/16/15   Sela Hua, MD    Family History Family History  Problem Relation Age of Onset  . COPD Sister   . Arthritis Mother   . Alcohol abuse Father  Social History Social History  Substance Use Topics  . Smoking status: Former Smoker    Packs/day: 1.00    Types: Cigarettes    Quit date: 12/17/2014  . Smokeless tobacco: Never Used  . Alcohol use No     Comment: EVERYDAY, three beers today     Allergies   Shrimp [shellfish allergy]   Review of Systems Review of Systems  Constitutional: Negative for fever.  Skin: Positive for wound.  All other systems reviewed and are  negative.   Physical Exam Updated Vital Signs BP 157/89 (BP Location: Left Arm)   Pulse 94   Temp 98.4 F (36.9 C) (Oral)   Resp 18   Ht 5\' 1"  (1.549 m)   Wt 131 lb (59.4 kg)   SpO2 99%   BMI 24.75 kg/m   Physical Exam  Constitutional: She is oriented to person, place, and time. She appears well-developed and well-nourished. No distress.  HENT:  Head: Normocephalic and atraumatic.  Eyes: Conjunctivae are normal.  Cardiovascular: Normal rate.   Pulmonary/Chest: Effort normal.  Abdominal: She exhibits no distension.  Neurological: She is alert and oriented to person, place, and time.  Skin: Skin is warm and dry.  Small post biopsy puncture of the skin in the medial aspect of the left mid posterior thorax with small amount of oozing blood.    Psychiatric: She has a normal mood and affect.  Nursing note and vitals reviewed.  ED Treatments / Results  DIAGNOSTIC STUDIES:  Oxygen Saturation is 99% on RA, normal by my interpretation.    COORDINATION OF CARE:  10:53 PM Discussed treatment plan with pt at bedside and pt agreed to plan.  Labs (all labs ordered are listed, but only abnormal results are displayed) Labs Reviewed - No data to display  EKG  EKG Interpretation None       Radiology No results found.  Procedures Procedures (including critical care time) LACERATION REPAIR Performed by: Etta Quill, NP  Consent: Verbal consent obtained. Risks and benefits: risks, benefits and alternatives were discussed Patient identity confirmed: provided demographic data Time out performed prior to procedure Prepped and Draped in normal sterile fashion Wound explored Laceration Location: back Laceration Length: < 1/10 of a cm No Foreign Bodies seen or palpated Anesthesia: local infiltration Local anesthetic: lidocaine 2% with epinephrine Anesthetic total: 1.5 cc  Irrigation method: syringe Amount of cleaning: standard Skin closure: 4-0 prolene Number of sutures or  staples: 1 suture Technique: simple interrupted  Patient tolerance: Patient tolerated the procedure well with no immediate complications.  Medications Ordered in ED Medications - No data to display   Initial Impression / Assessment and Plan / ED Course  I have reviewed the triage vital signs and the nursing notes.  Pertinent labs & imaging results that were available during my care of the patient were reviewed by me and considered in my medical decision making (see chart for details).  Clinical Course     Punch biopsy of skin earlier today.  Continued bleeding. Single stitch inserted with control of bleeding. Discussed wound care with patient and answered questions.  Pt to f-u for suture removal in one week. Pt is hemodynamically stable with no complaints prior to dc.     Final Clinical Impressions(s) / ED Diagnoses   Final diagnoses:  Postoperative hemorrhage of skin following dermatologic procedure    New Prescriptions New Prescriptions   No medications on file    I personally performed the services described in this documentation, which was  scribed in my presence. The recorded information has been reviewed and is accurate.     Etta Quill, NP 01/18/16 [id number]    Merrily Pew, MD 01/18/16 [phone]

## 2016-01-17 NOTE — Assessment & Plan Note (Signed)
Well-controlled. BP 120/85 in clinic today. Goal <140/90.   - Continue Lisinopril 20mg  daily - Can recheck kidney function at next visit - Follow-up in 3 months

## 2016-01-23 ENCOUNTER — Other Ambulatory Visit: Payer: Self-pay | Admitting: Family Medicine

## 2016-02-03 ENCOUNTER — Ambulatory Visit: Payer: BLUE CROSS/BLUE SHIELD

## 2016-02-07 ENCOUNTER — Ambulatory Visit (INDEPENDENT_AMBULATORY_CARE_PROVIDER_SITE_OTHER): Payer: BLUE CROSS/BLUE SHIELD | Admitting: *Deleted

## 2016-02-07 DIAGNOSIS — L989 Disorder of the skin and subcutaneous tissue, unspecified: Secondary | ICD-10-CM | POA: Diagnosis not present

## 2016-02-07 NOTE — Progress Notes (Signed)
     Patient in nurse clinic for suture removal of mid back area just below bra strip.  Pt denies any pain, tenderness or redness.  Patient did report some itching to area.  Patient is s/p wart removal 01/17/16 at [facility]  Patient went to ED the same day reporting site was bleeding.  There was one suture placed in the ED.  Upon assessing patient's wound, there was no suture in place.  Patient stated she did not know if the suture was removed when she took a shower and it came off with the bandage.  The area had a small knot in place.  Precept with Dr. Ardelia Mems; confirm a small cyst. Return precaution given.  Advise patient if she decided to have cyst removed to call for an appointment.  Patient stated understanding.   Derl Barrow, RN

## 2016-02-26 ENCOUNTER — Ambulatory Visit
Admission: RE | Admit: 2016-02-26 | Discharge: 2016-02-26 | Disposition: A | Discharge status: Home / Self Care | Payer: BLUE CROSS/BLUE SHIELD | Source: Ambulatory Visit | Attending: Family Medicine | Admitting: Family Medicine

## 2016-02-26 DIAGNOSIS — Z1231 Encounter for screening mammogram for malignant neoplasm of breast: Secondary | ICD-10-CM

## 2016-02-28 ENCOUNTER — Telehealth: Payer: Self-pay | Admitting: Internal Medicine

## 2016-02-28 NOTE — Telephone Encounter (Signed)
Pt thinks she pulled muscle in back and is having musle spasms.  She has some meloxicam. Can she take this?

## 2016-02-28 NOTE — Telephone Encounter (Signed)
I called pt and informed her Meloxicam was ok to take for her back. Pt stated that she has started throwing up since she called earlier. Pt states she has not had anything to eat today. I told pt she should try and eat something, especially before taking the medication. Pt declined coming in. I strongly encouraged her to stay hydrated and to go to urgent care or ED if sxs worsen. Pt voiced understanding. Just an FYI.

## 2016-02-28 NOTE — Telephone Encounter (Signed)
Please let Connie Wade know that she can take the Meloxicam for a pulled back. If this does not get better, she should schedule an appointment to be seen. Thank you!

## 2016-02-29 ENCOUNTER — Emergency Department (HOSPITAL_COMMUNITY): Payer: BLUE CROSS/BLUE SHIELD

## 2016-02-29 ENCOUNTER — Encounter (HOSPITAL_COMMUNITY): Payer: Self-pay | Admitting: Emergency Medicine

## 2016-02-29 ENCOUNTER — Emergency Department (HOSPITAL_COMMUNITY)
Admission: EM | Admit: 2016-02-29 | Discharge: 2016-02-29 | Disposition: A | Discharge status: Home / Self Care | Payer: BLUE CROSS/BLUE SHIELD | Attending: Emergency Medicine | Admitting: Emergency Medicine

## 2016-02-29 DIAGNOSIS — I10 Essential (primary) hypertension: Secondary | ICD-10-CM | POA: Insufficient documentation

## 2016-02-29 DIAGNOSIS — R112 Nausea with vomiting, unspecified: Secondary | ICD-10-CM | POA: Diagnosis not present

## 2016-02-29 DIAGNOSIS — R197 Diarrhea, unspecified: Secondary | ICD-10-CM

## 2016-02-29 DIAGNOSIS — R1030 Lower abdominal pain, unspecified: Secondary | ICD-10-CM | POA: Diagnosis not present

## 2016-02-29 DIAGNOSIS — R1110 Vomiting, unspecified: Secondary | ICD-10-CM | POA: Diagnosis not present

## 2016-02-29 DIAGNOSIS — Z87891 Personal history of nicotine dependence: Secondary | ICD-10-CM | POA: Insufficient documentation

## 2016-02-29 DIAGNOSIS — K529 Noninfective gastroenteritis and colitis, unspecified: Secondary | ICD-10-CM | POA: Diagnosis not present

## 2016-02-29 DIAGNOSIS — R109 Unspecified abdominal pain: Secondary | ICD-10-CM | POA: Diagnosis not present

## 2016-02-29 LAB — COMPREHENSIVE METABOLIC PANEL
ALK PHOS: 81 U/L (ref 38–126)
ALT: 18 U/L (ref 14–54)
AST: 23 U/L (ref 15–41)
Albumin: 4.4 g/dL (ref 3.5–5.0)
Anion gap: 13 (ref 5–15)
BUN: 5 mg/dL — AB (ref 6–20)
CALCIUM: 10 mg/dL (ref 8.9–10.3)
CHLORIDE: 100 mmol/L — AB (ref 101–111)
CO2: 25 mmol/L (ref 22–32)
Creatinine, Ser: 0.72 mg/dL (ref 0.44–1.00)
Glucose, Bld: 138 mg/dL — ABNORMAL HIGH (ref 65–99)
Potassium: 3.3 mmol/L — ABNORMAL LOW (ref 3.5–5.1)
Sodium: 138 mmol/L (ref 135–145)
Total Bilirubin: 0.4 mg/dL (ref 0.3–1.2)
Total Protein: 8.6 g/dL — ABNORMAL HIGH (ref 6.5–8.1)

## 2016-02-29 LAB — URINALYSIS, ROUTINE W REFLEX MICROSCOPIC
Bilirubin Urine: NEGATIVE
Glucose, UA: NEGATIVE mg/dL
Ketones, ur: 20 mg/dL — AB
Leukocytes, UA: NEGATIVE
Nitrite: NEGATIVE
PH: 5 (ref 5.0–8.0)
Protein, ur: 100 mg/dL — AB
SPECIFIC GRAVITY, URINE: 1.021 (ref 1.005–1.030)

## 2016-02-29 LAB — CBC
HCT: 38.1 % (ref 36.0–46.0)
Hemoglobin: 13.1 g/dL (ref 12.0–15.0)
MCH: 25.1 pg — AB (ref 26.0–34.0)
MCHC: 34.4 g/dL (ref 30.0–36.0)
MCV: 73 fL — AB (ref 78.0–100.0)
Platelets: 448 10*3/uL — ABNORMAL HIGH (ref 150–400)
RBC: 5.22 MIL/uL — ABNORMAL HIGH (ref 3.87–5.11)
RDW: 16.8 % — AB (ref 11.5–15.5)
WBC: 21.7 10*3/uL — ABNORMAL HIGH (ref 4.0–10.5)

## 2016-02-29 LAB — LIPASE, BLOOD: LIPASE: 17 U/L (ref 11–51)

## 2016-02-29 MED ORDER — SODIUM CHLORIDE 0.9 % IV BOLUS (SEPSIS)
1000.0000 mL | Freq: Once | INTRAVENOUS | Status: AC
  Administered 2016-02-29: 1000 mL via INTRAVENOUS

## 2016-02-29 MED ORDER — IOPAMIDOL (ISOVUE-300) INJECTION 61%
INTRAVENOUS | Status: AC
  Administered 2016-02-29: 100 mL
  Filled 2016-02-29: qty 100

## 2016-02-29 MED ORDER — ONDANSETRON HCL 4 MG/2ML IJ SOLN
4.0000 mg | Freq: Once | INTRAMUSCULAR | Status: AC
  Administered 2016-02-29: 4 mg via INTRAVENOUS
  Filled 2016-02-29: qty 2

## 2016-02-29 MED ORDER — ONDANSETRON HCL 4 MG PO TABS: 4.0000 mg | ORAL_TABLET | Freq: Three times a day (TID) | ORAL | 0 refills | Status: DC | PRN

## 2016-02-29 MED ORDER — HYDROMORPHONE HCL 2 MG/ML IJ SOLN
1.0000 mg | Freq: Once | INTRAMUSCULAR | Status: AC
  Administered 2016-02-29: 1 mg via INTRAVENOUS
  Filled 2016-02-29: qty 1

## 2016-02-29 NOTE — ED Provider Notes (Signed)
[facility] DEPT Provider Note   CSN: [id number] Arrival date & time: 02/29/16  [id number]     History   Chief Complaint Chief Complaint  Patient presents with  . Emesis  . Abdominal Pain    HPI Connie Wade is a 61 y.o. female.with history of HIV, HTN, and chronic back pain that presents to the ED with abdominal pain that started yesterday.  She rates the pain a 9/10 and localized to the lower abdomin.  She has associated symptoms of nausea, vomiting and diarrhea.  She states that she was vomiting several times yesterday and dry heaving today.  She states she also has diarrhea when she vomits. She has only taken tylenol for her symptoms.  She denies sick contacts.    HPI  Past Medical History:  Diagnosis Date  . ANEMIA, OTHER, UNSPECIFIED 03/25/2006  . DEPRESSION 03/17/2007  . HEPATITIS B 03/25/2006  . HIV 03/25/2006   since 2000  . HYDRADENITIS 03/25/2006  . HYPERLIPIDEMIA 03/25/2006  . HYPERTENSION, BENIGN ESSENTIAL 04/26/2007  . TOBACCO DEPENDENCE 03/25/2006  . Tubular adenoma of colon 06/23/2011   Benign, no high grade dysplasia 06/16/11 Followed by [name] Dr. Watt Climes Repeat in 5 years (due 2018)  . UTERINE FIBROID 03/25/2006    Patient Active Problem List   Diagnosis Date Noted  . Right shoulder pain 01/17/2016  . Back skin lesion 01/17/2016  . Health care maintenance 01/17/2016  . Subarachnoid hemorrhage due to ruptured aneurysm ([place]) 12/04/2014  . Tobacco abuse 09/19/2012  . Tubular adenoma of colon 06/23/2011  . HYPERTENSION, BENIGN ESSENTIAL 04/26/2007  . Depression 03/17/2007  . Degenerative disc disease, lumbar 03/17/2007  . Human immunodeficiency virus (HIV) disease ([place]) 03/25/2006  . HEPATITIS B 03/25/2006  . UTERINE FIBROID 03/25/2006  . HLD (hyperlipidemia) 03/25/2006  . ANEMIA, OTHER, UNSPECIFIED 03/25/2006  . Allergic rhinitis 03/25/2006  . MENOPAUSAL SYNDROME 03/25/2006    Past Surgical History:  Procedure Laterality Date  . IR GENERIC HISTORICAL   07/26/2015   IR ANGIO INTRA EXTRACRAN SEL INTERNAL CAROTID BILAT MOD SED 07/26/2015 Consuella Lose, MD [facility]  . IR GENERIC HISTORICAL  07/26/2015   IR ANGIO VERTEBRAL SEL VERTEBRAL BILAT MOD SED 07/26/2015 Consuella Lose, MD [facility]  . OVARIAN CYST REMOVAL  2001  . RADIOLOGY WITH ANESTHESIA N/A 12/05/2014   Procedure: RADIOLOGY WITH ANESTHESIA-COING FOR SUBARACHNOID HEMORRHAGE;  Surgeon: Consuella Lose, MD;  Location: [place];  Service: Radiology;  Laterality: N/A;    OB History    No data available       Home Medications    Prior to Admission medications   Medication Sig Start Date End Date Taking? Authorizing Provider  acetaminophen (TYLENOL) 500 MG tablet Take 1,000 mg by mouth every 6 (six) hours as needed for moderate pain or headache.   Yes Historical Provider, MD  fluticasone (FLONASE) 50 MCG/ACT nasal spray PLACE 2 SPRAYS INTO BOTH NOSTRILS DAILY. Patient taking differently: Place 2 sprays into both nostrils 2 (two) times daily.  01/15/16 07/13/16 Yes Sela Hua, MD  lisinopril (PRINIVIL,ZESTRIL) 20 MG tablet TAKE 1 TABLET EVERY DAY 12/16/15  Yes Sela Hua, MD  allopurinol (ZYLOPRIM) 100 MG tablet Take 1 tablet (100 mg total) by mouth daily. Increase to 200mg  (2 tablets) after 7 days Patient not taking: Reported on 02/29/2016 12/06/15   Sela Hua, MD  fluticasone [facility]) 50 MCG/ACT nasal spray PLACE 2 SPRAYS INTO BOTH NOSTRILS DAILY. Patient not taking: Reported on 02/29/2016 01/23/16   Sela Hua, MD  ondansetron (ZOFRAN) 4 MG tablet Take 1 tablet (4 mg total) by mouth every 8 (eight) hours as needed for nausea or vomiting. 02/29/16   Valinda Party, DO    Family History Family History  Problem Relation Age of Onset  . COPD Sister   . Arthritis Mother   . Alcohol abuse Father     Social History Social History  Substance Use Topics  . Smoking status: Former Smoker    Packs/day: 1.00    Types: Cigarettes    Quit date: 12/17/2014   . Smokeless tobacco: Never Used  . Alcohol use No     Comment: EVERYDAY, three beers today     Allergies   Shrimp [shellfish allergy]   Review of Systems Review of Systems  HENT: Negative for sore throat.   Respiratory: Positive for cough.   Cardiovascular: Negative for chest pain.  Gastrointestinal: Positive for abdominal pain, diarrhea, nausea and vomiting. Negative for blood in stool.  Genitourinary: Negative for dysuria, flank pain, hematuria, pelvic pain, urgency and vaginal discharge.  Musculoskeletal: Positive for back pain. Negative for myalgias.  Neurological: Positive for headaches.     Physical Exam Updated Vital Signs BP 171/86 (BP Location: Left Arm)   Pulse 89   Temp 100 F (37.8 C) (Oral)   Resp 18   Ht 5\' 1"  (1.549 m)   Wt 54.9 kg   SpO2 99%   BMI 22.86 kg/m   Physical Exam  Constitutional: She is oriented to person, place, and time. She appears well-developed and well-nourished.  HENT:  Head: Normocephalic and atraumatic.  Eyes: Pupils are equal, round, and reactive to light.  Cardiovascular: Normal rate, regular rhythm and normal heart sounds.  Exam reveals no gallop and no friction rub.   No murmur heard. Pulmonary/Chest: Effort normal and breath sounds normal. No respiratory distress. She has no wheezes. She has no rales.  Abdominal: Soft. Bowel sounds are normal. She exhibits no distension and no mass. There is tenderness. There is no rebound and no guarding. No hernia.  Musculoskeletal: She exhibits no edema.  Neurological: She is alert and oriented to person, place, and time.  Skin: Skin is warm and dry.  Psychiatric: She has a normal mood and affect. Her behavior is normal.     ED Treatments / Results  Labs (all labs ordered are listed, but only abnormal results are displayed) Labs Reviewed  COMPREHENSIVE METABOLIC PANEL - Abnormal; Notable for the following:       Result Value   Potassium 3.3 (*)    Chloride 100 (*)    Glucose, Bld  138 (*)    BUN 5 (*)    Total Protein 8.6 (*)    All other components within normal limits  CBC - Abnormal; Notable for the following:    WBC 21.7 (*)    RBC 5.22 (*)    MCV 73.0 (*)    MCH 25.1 (*)    RDW 16.8 (*)    Platelets 448 (*)    All other components within normal limits  URINALYSIS, ROUTINE W REFLEX MICROSCOPIC - Abnormal; Notable for the following:    APPearance HAZY (*)    Hgb urine dipstick MODERATE (*)    Ketones, ur 20 (*)    Protein, ur 100 (*)    Bacteria, UA RARE (*)    Squamous Epithelial / LPF 0-5 (*)    All other components within normal limits  LIPASE, BLOOD    EKG  EKG Interpretation None  Radiology Ct Abdomen Pelvis W Contrast  Result Date: 02/29/2016 CLINICAL DATA:  Abdominal pain and vomiting since yesterday. EXAM: CT ABDOMEN AND PELVIS WITH CONTRAST TECHNIQUE: Multidetector CT imaging of the abdomen and pelvis was performed using the standard protocol following bolus administration of intravenous contrast. CONTRAST:  170mL ISOVUE-300 IOPAMIDOL (ISOVUE-300) INJECTION 61% COMPARISON:  None. FINDINGS: Lower chest: The lung bases are clear of acute process. Streaky bibasilar dependent atelectasis. No pleural effusion or pulmonary lesions. The heart is normal in size. No pericardial effusion. The distal esophagus and aorta are unremarkable. Hepatobiliary: No focal hepatic lesions or intrahepatic biliary dilatation. The gallbladder is normal. No common bile duct dilatation. Pancreas: No mass, inflammation or ductal dilatation. Spleen: The spleen is small.  No focal lesions. Adrenals/Urinary Tract: There is a right adrenal gland lesion. This could be a bilobed lesion or 2 adjacent lesions. It measures a maximum of 2.3 x 1.9 cm. It is most likely a benign adenoma given the washout on the delayed images. I would recommend a followup noncontrast CT abdomen in 4 months to document stability. The left adrenal gland is normal. Both kidneys are normal. A small upper  pole right renal cyst is noted. No obstructing ureteral calculi or bladder calculi. Stomach/Bowel: the stomach, duodenum, small bowel and colon are grossly normal without oral contrast. No inflammatory changes, mass lesions or obstructive findings. The terminal ileum is normal. The appendix is normal. Scattered sigmoid diverticulosis without findings for acute diverticulitis. Vascular/Lymphatic: Advanced atherosclerotic calcifications involving the aorta and iliac arteries. No aneurysm or dissection. The major venous structures are patent. Small scattered mesenteric and retroperitoneal lymph nodes but no mass or adenopathy. Reproductive: Markedly enlarged fibroid uterus. The ovaries are grossly normal. Other: No pelvic mass or adenopathy. No free pelvic fluid collections. No inguinal mass or adenopathy. No abdominal wall hernia or subcutaneous lesions. Musculoskeletal: No significant bony findings. Advanced facet degenerative changes. IMPRESSION: 1. No acute abdominal/pelvic findings or lymphadenopathy. 2. Right adrenal gland lesion or lesions are likely benign adenomas but recommend followup noncontrast abdominal CT scan in 4 months to document stability. 3. Enlarged fibroid uterus. 4. Age advanced atherosclerotic calcifications involving the aorta and iliac arteries. The Electronically Signed   By: Marijo Sanes M.D.   On: 02/29/2016 13:01    Procedures Procedures (including critical care time)  Medications Ordered in ED Medications  sodium chloride 0.9 % bolus 1,000 mL (0 mLs Intravenous Stopped 02/29/16 1055)  HYDROmorphone (DILAUDID) injection 1 mg (1 mg Intravenous Given 02/29/16 1052)  ondansetron (ZOFRAN) injection 4 mg (4 mg Intravenous Given 02/29/16 1053)  sodium chloride 0.9 % bolus 1,000 mL (0 mLs Intravenous Stopped 02/29/16 1314)  iopamidol (ISOVUE-300) 61 % injection (100 mLs  Contrast Given 02/29/16 1218)     Initial Impression / Assessment and Plan / ED Course  I have reviewed the triage  vital signs and the nursing notes.  Pertinent labs & imaging results that were available during my care of the patient were reviewed by me and considered in my medical decision making (see chart for details).  Clinical Course as of Feb 28 1506  Sat Feb 29, 2016  1250 CT ABDOMEN PELVIS W CONTRAST [JH]    Clinical Course User Index [JH] Valinda Party, DO    Patient likely has gastroenteritis. CT abdomen pelvis did not show inflammatory changes or diverticulitis. Will treat symptomatically with zofran and encourage fluids.  Told patient to return if symptoms persist or worsen.  Told patient of the incidental finding of lesions  on her right adrenal gland and the need for CT scan in 4 months to follow up stability.  Final Clinical Impressions(s) / ED Diagnoses   Final diagnoses:  Gastroenteritis  Nausea vomiting and diarrhea    New Prescriptions Discharge Medication List as of 02/29/2016  1:33 PM    START taking these medications   Details  ondansetron (ZOFRAN) 4 MG tablet Take 1 tablet (4 mg total) by mouth every 8 (eight) hours as needed for nausea or vomiting., Starting Sat 02/29/2016, Normal         Elza Rafter [place], DO 02/29/16 [place], MD 03/01/16 [id number]

## 2016-02-29 NOTE — Discharge Instructions (Addendum)
Connie Wade  Please take Zofran to help with your nausea and vomiting Please continue to stay hydrated Please return if your symptoms worsen or do not improve.  When you had the CT of your abdomen done today it was noted that Right adrenal gland lesion or lesions are likely benign adenomas but recommend followup noncontrast abdominal CT scan in 4 months to document stability.

## 2016-02-29 NOTE — ED Notes (Signed)
Pt remains out of room for testing 

## 2016-02-29 NOTE — ED Notes (Signed)
Patient transported to CT 

## 2016-02-29 NOTE — ED Notes (Signed)
Nurse drawing labs. 

## 2016-02-29 NOTE — ED Triage Notes (Signed)
Pt c/o started with cold symptoms progressing to abdominal pain, diarrhea and vomiting yesterday. Pt also reports back pain and was prescribed meloxicam but has not taken medication.

## 2016-03-06 ENCOUNTER — Telehealth: Payer: Self-pay | Admitting: Internal Medicine

## 2016-03-06 NOTE — Telephone Encounter (Signed)
Pt has been sick and has been to ED. Pt would like something called in for cough. Pt also seeks advice about what to do for diarrhea. Please advise. Pt uses [facility] on [address]. ep

## 2016-03-06 NOTE — Telephone Encounter (Signed)
Return call to patient regarding her cough and diarrhea.  Advised patient since she was seen in ED a few days go for diarrhea and n/v and diagnosis with gastroenteritis.  Advised patient she should keep herself well hydrated.  If symptoms worsen to give [place] a call back.    Patient also reported having a cough x 1-2 weeks now.  She has tried Alka-Seltzer cold Plus, with no relief.  She is coughing up greenish mucous.  Advise pt to try Mucinex or Robintussin. Patient denies fever.    Will forward to PCP for further advise.  Derl Barrow, RN

## 2016-03-06 NOTE — Telephone Encounter (Signed)
Agree with Connie Wade. If she is not getting better in the next few days, she should be seen for an appointment.

## 2016-04-22 ENCOUNTER — Other Ambulatory Visit: Payer: Self-pay | Admitting: Internal Medicine

## 2016-04-23 ENCOUNTER — Ambulatory Visit (INDEPENDENT_AMBULATORY_CARE_PROVIDER_SITE_OTHER): Payer: BLUE CROSS/BLUE SHIELD | Admitting: Internal Medicine

## 2016-04-23 DIAGNOSIS — R51 Headache: Secondary | ICD-10-CM | POA: Diagnosis not present

## 2016-04-23 DIAGNOSIS — M109 Gout, unspecified: Secondary | ICD-10-CM

## 2016-04-23 DIAGNOSIS — R519 Headache, unspecified: Secondary | ICD-10-CM

## 2016-04-23 MED ORDER — NAPROXEN 500 MG PO TABS: 500.0000 mg | ORAL_TABLET | Freq: Two times a day (BID) | ORAL | 0 refills | Status: DC

## 2016-04-23 MED ORDER — LOSARTAN POTASSIUM 50 MG PO TABS: 50.0000 mg | ORAL_TABLET | Freq: Every day | ORAL | 0 refills | Status: DC

## 2016-04-23 NOTE — Progress Notes (Signed)
   [facility] Phone: [phone]  Subjective:  Connie Wade is a 61 year old female coming in with headaches and gout.  Headaches: Have been going on for 3 weeks. Thinks that these headaches are coming from the the change of seasons. It is typical for her to get headaches at the beginning of spring. She does have a history of migraines, but this does not feel like a typical migraine for her. She states this headache is mostly located in the front of her head and face. The headache is mild. She has been taking Tylenol, which has been helping. She is not interested in taking any other medication for headaches, she just wanted to let me know that it was going on. No changes in vision, no numbness of the extremities, no weakness, no headache that wakes her up at night, no fevers no chills.  Gout: She thinks she has gout of her right big toe. This has been going on for about a month. Her big toe is so painful, that she cannot even have the sheet touch the top of her toe. She has previously taken allopurinol, but states that she does not like taking medication since she stopped it. She has previously had gout a couple times per year. She has noticed warmth and redness.  ROS: See HPI for pertinent positives and negatives  Past Medical History- hypertension, history of hepatitis B, history of subarachnoid hemorrhage due to ruptured aneurysm, tobacco use, HIV, hyperlipidemia.  Family history reviewed for today's visit. No changes.  Social history- patient is a former smoker  Objective: BP 120/64   Pulse 72   Temp 98.2 F (36.8 C) (Oral)   Wt 127 lb 6.4 oz (57.8 kg)   SpO2 98%   BMI 24.07 kg/m  Gen: NAD, alert, cooperative with exam HEENT: NCAT, EOMI, MMM, nasal turbinates mildly erythematous, oropharynx clear. Neck: FROM, supple, no cervical lymphadenopathy CV: RRR, no murmur Resp: CTABL, no wheezes, normal work of breathing Msk: Right great toe is mildly erythematous and  warm to the touch, exquisitely tender to palpation. Neuro: Alert and oriented, CN 2-12 intact, 5/5 muscle strength in the upper and lower extremities bilaterally, sensation intact to light touch throughout extremities, reflexes normal and symmetric, normal finger-to-nose bilaterally.  Assessment/Plan: Headaches: Seem to be related to her allergies, as these headaches typically occur with the changing seasons. Patient taking Tylenol, which is helping. She does have a history of subarachnoid hemorrhage due to ruptured aneurysm, but this headache is mild and she has no red flags or signs of infection. Neuro exam is completely normal. - Continue Tylenol prn - Follow up if worsening or not improving  Gout: Occurring in her right great toe. Patient not interested in restarting Allopurinol. - Prescribed naproxen 500 mg twice a day  5 days - Switched Lisinopril to Losartan, which can help prevent gout attacks. Discussed with Dr. Valentina Lucks.   Hyman Bible, MD PGY-2

## 2016-04-23 NOTE — Patient Instructions (Signed)
It was so wonderful to see you!  I think it is completely safe for you to start doing yoga. I would recommend looking for yoga classes on youtube, since they're free!  I have changed your blood pressure medication from Lisinopril to Losartan, which will help to decrease gout in the future. I have also prescribed some Naproxen for the gout. You can use this twice a day for 5 days.  If your toe pain is not getting better, please come back to see [place]! Otherwise, we will see you back in 6 months!  -Dr. Brett Albino

## 2016-04-27 DIAGNOSIS — M109 Gout, unspecified: Secondary | ICD-10-CM | POA: Insufficient documentation

## 2016-04-27 HISTORY — DX: Gout, unspecified: M10.9

## 2016-04-27 NOTE — Assessment & Plan Note (Signed)
Occurring in her right great toe. Patient not interested in restarting Allopurinol. - Prescribed naproxen 500 mg twice a day  5 days - Switched Lisinopril to Losartan, which can help prevent gout attacks. Discussed with Dr. Valentina Lucks.

## 2016-04-27 NOTE — Assessment & Plan Note (Signed)
Seem to be related to her allergies, as these headaches typically occur with the changing seasons. Patient taking Tylenol, which is helping. She does have a history of subarachnoid hemorrhage due to ruptured aneurysm, but this headache is mild and she has no red flags or signs of infection. Neuro exam is completely normal. - Continue Tylenol prn - Follow up if worsening or not improving

## 2016-05-12 ENCOUNTER — Ambulatory Visit (INDEPENDENT_AMBULATORY_CARE_PROVIDER_SITE_OTHER): Payer: BLUE CROSS/BLUE SHIELD | Admitting: Student

## 2016-05-12 ENCOUNTER — Encounter: Payer: Self-pay | Admitting: Student

## 2016-05-12 VITALS — BP 126/80 | HR 75 | Temp 98.5°F | Wt 124.0 lb

## 2016-05-12 DIAGNOSIS — M25552 Pain in left hip: Secondary | ICD-10-CM | POA: Diagnosis not present

## 2016-05-12 DIAGNOSIS — M25551 Pain in right hip: Secondary | ICD-10-CM | POA: Insufficient documentation

## 2016-05-12 MED ORDER — KETOROLAC TROMETHAMINE 10 MG PO TABS: 10.0000 mg | ORAL_TABLET | Freq: Four times a day (QID) | ORAL | 0 refills | Status: DC | PRN

## 2016-05-12 NOTE — Patient Instructions (Addendum)
Follow up with Dr Nicola Girt as needed Obtain the hip xray  If your pain gets worse, return to the office sooner

## 2016-05-12 NOTE — Progress Notes (Signed)
   Subjective:    Patient ID: Connie Wade, female    DOB: 04/06/1955, 61 y.o.   MRN: [id number]   CC: left hip pain  HPI: 61 y/o F presenst with left hip pain  Left hip pain - patient is not clear on the onset of her pain, initially states it was present for 3-4 days, then for 2 weeks - she denies injury, rash or fevers - pain is helped with tylenol but it dopes not completely relieve it - her pain is improved by laying on her hip - denies back pain, deneis weakness - she does have a history of chronic back low back pain but she feels that is improved   Smoking status reviewed  Review of Systems  Per HPI, else denies recent illness, chest pain, shortness of breath  Objective:  BP 126/80   Pulse 75   Temp 98.5 F (36.9 C) (Oral)   Wt 124 lb (56.2 kg)   SpO2 99%   BMI 23.43 kg/m  Vitals and nursing note reviewed  General: NAD Cardiac: RRR,  Respiratory: CTAB, normal effort MSK: 5/5 bilateral LE strength normal hip ROM on the right, limited faber/fadir on the left secondary to discomfort which improved with distraction, no overlying rashes. Normal gait Skin: warm and dry, no rashes noted Neuro: alert and oriented, no focal deficits   Assessment & Plan:    Left hip pain Left hip pain of unclear duration, without history of injury or fevers. She does have a history of low back pain secondary to arthritis which makes me feel her pain is likely due to arthritis - will obtain hip XR - oral toradol for pain - will follow up with PCP    Dietrich Samuelson A. Lincoln Brigham MD, [facility] Resident PGY-3 Pager [phone]

## 2016-05-12 NOTE — Assessment & Plan Note (Signed)
Left hip pain of unclear duration, without history of injury or fevers. She does have a history of low back pain secondary to arthritis which makes me feel her pain is likely due to arthritis - will obtain hip XR - oral toradol for pain - will follow up with PCP

## 2016-05-13 ENCOUNTER — Other Ambulatory Visit: Payer: BLUE CROSS/BLUE SHIELD

## 2016-05-13 DIAGNOSIS — I608 Other nontraumatic subarachnoid hemorrhage: Secondary | ICD-10-CM

## 2016-05-13 LAB — COMPLETE METABOLIC PANEL WITH GFR
ALBUMIN: 4.2 g/dL (ref 3.6–5.1)
ALK PHOS: 64 U/L (ref 33–130)
ALT: 11 U/L (ref 6–29)
AST: 18 U/L (ref 10–35)
BILIRUBIN TOTAL: 0.4 mg/dL (ref 0.2–1.2)
BUN: 9 mg/dL (ref 7–25)
CO2: 25 mmol/L (ref 20–31)
Calcium: 9.4 mg/dL (ref 8.6–10.4)
Chloride: 104 mmol/L (ref 98–110)
Creat: 0.64 mg/dL (ref 0.50–0.99)
GFR, Est Non African American: >89 mL/min (ref >=60)
GLUCOSE: 104 mg/dL — AB (ref 65–99)
POTASSIUM: 3.9 mmol/L (ref 3.5–5.3)
SODIUM: 138 mmol/L (ref 135–146)
TOTAL PROTEIN: 7.2 g/dL (ref 6.1–8.1)

## 2016-05-13 LAB — CBC WITH DIFFERENTIAL/PLATELET
BASOS ABS: 0 {cells}/uL (ref 0–200)
BASOS PCT: 0 %
EOS PCT: 1 %
Eosinophils Absolute: 58 cells/uL (ref 15–500)
HCT: 36.9 % (ref 35.0–45.0)
HEMOGLOBIN: 11.9 g/dL (ref 11.7–15.5)
LYMPHS ABS: 2842 {cells}/uL (ref 850–3900)
Lymphocytes Relative: 49 %
MCH: 24.5 pg — AB (ref 27.0–33.0)
MCHC: 32.2 g/dL (ref 32.0–36.0)
MCV: 75.9 fL — ABNORMAL LOW (ref 80.0–100.0)
MONOS PCT: 8 %
MPV: 8.6 fL (ref 7.5–12.5)
Monocytes Absolute: 464 cells/uL (ref 200–950)
NEUTROS ABS: 2436 {cells}/uL (ref 1500–7800)
Neutrophils Relative %: 42 %
PLATELETS: 457 10*3/uL — AB (ref 140–400)
RBC: 4.86 MIL/uL (ref 3.80–5.10)
RDW: 19.2 % — ABNORMAL HIGH (ref 11.0–15.0)
WBC: 5.8 10*3/uL (ref 3.8–10.8)

## 2016-05-13 LAB — LIPID PANEL
CHOL/HDL RATIO: 2.2 ratio (ref <5.0)
CHOLESTEROL: 164 mg/dL (ref <200)
HDL: 76 mg/dL (ref >50)
LDL Cholesterol: 74 mg/dL (ref <100)
TRIGLYCERIDES: 72 mg/dL (ref <150)
VLDL: 14 mg/dL (ref <30)

## 2016-05-14 LAB — T-HELPER CELL (CD4) - (RCID CLINIC ONLY)
CD4 T CELL HELPER: 40 % (ref 33–55)
CD4 T Cell Abs: 1130 /uL (ref 400–2700)

## 2016-05-14 LAB — RPR

## 2016-05-15 LAB — HIV-1 RNA QUANT-NO REFLEX-BLD
HIV 1 RNA Quant: <20 copies/mL
HIV-1 RNA QUANT, LOG: NOT DETECTED {Log_copies}/mL

## 2016-05-19 ENCOUNTER — Ambulatory Visit (INDEPENDENT_AMBULATORY_CARE_PROVIDER_SITE_OTHER): Payer: BLUE CROSS/BLUE SHIELD | Admitting: Infectious Disease

## 2016-05-19 ENCOUNTER — Encounter: Payer: Self-pay | Admitting: Infectious Disease

## 2016-05-19 VITALS — BP 148/89 | HR 83 | Temp 98.4°F | Wt 122.0 lb

## 2016-05-19 DIAGNOSIS — Z79899 Other long term (current) drug therapy: Secondary | ICD-10-CM | POA: Diagnosis not present

## 2016-05-19 DIAGNOSIS — B20 Human immunodeficiency virus [HIV] disease: Secondary | ICD-10-CM | POA: Diagnosis not present

## 2016-05-19 DIAGNOSIS — I608 Other nontraumatic subarachnoid hemorrhage: Secondary | ICD-10-CM

## 2016-05-19 DIAGNOSIS — I10 Essential (primary) hypertension: Secondary | ICD-10-CM

## 2016-05-19 DIAGNOSIS — Z113 Encounter for screening for infections with a predominantly sexual mode of transmission: Secondary | ICD-10-CM

## 2016-05-19 DIAGNOSIS — M25552 Pain in left hip: Secondary | ICD-10-CM

## 2016-05-19 NOTE — Progress Notes (Signed)
Chief complaint: followup for  HIV Subjective:    Patient ID: Connie Wade, female    DOB: 11/21/1955, 61 y.o.   MRN: [id number]  HPI  61 year old African American with HIV who is an ELITE controller and enrolled in [place] who was receiving Complera but stopped taking the meds due to her not wanting to be on meds for greater than 21 days and had to be taken off study. She was admitted to Neurosurgery with ruptured ICH and underwent embolization. She is taking nimotop and solumedrol after DC. She received ODEFSEY as an inpatient (in place of Complera) but Then stopped her medications. She says that she does not like pills and after going over the data from material published at [place] re effects of antiretrovirals on inflammation he still did not want to start medications today. We will consider BIKTARVY in future.  Lab Results  Component Value Date   HIV1RNAQUANT <20 NOT DETECTED 05/13/2016   HIV1RNAQUANT <20 12/24/2014   HIV1RNAQUANT <20 02/12/2012      Lab Results  Component Value Date   CD4TABS 1,130 05/13/2016   CD4TABS 1,010 12/24/2014   CD4TABS 724 10/30/2014      Past Medical History:  Diagnosis Date  . ANEMIA, OTHER, UNSPECIFIED 03/25/2006  . DEPRESSION 03/17/2007  . HEPATITIS B 03/25/2006  . HIV 03/25/2006   since 2000  . HYDRADENITIS 03/25/2006  . HYPERLIPIDEMIA 03/25/2006  . HYPERTENSION, BENIGN ESSENTIAL 04/26/2007  . TOBACCO DEPENDENCE 03/25/2006  . Tubular adenoma of colon 06/23/2011   Benign, no high grade dysplasia 06/16/11 Followed by [name] Dr. Watt Climes Repeat in 5 years (due 2018)  . UTERINE FIBROID 03/25/2006    Past Surgical History:  Procedure Laterality Date  . IR GENERIC HISTORICAL  07/26/2015   IR ANGIO INTRA EXTRACRAN SEL INTERNAL CAROTID BILAT MOD SED 07/26/2015 Consuella Lose, MD [facility]  . IR GENERIC HISTORICAL  07/26/2015   IR ANGIO VERTEBRAL SEL VERTEBRAL BILAT MOD SED 07/26/2015 Consuella Lose, MD [facility]  . OVARIAN CYST REMOVAL   2001  . RADIOLOGY WITH ANESTHESIA N/A 12/05/2014   Procedure: RADIOLOGY WITH ANESTHESIA-COING FOR SUBARACHNOID HEMORRHAGE;  Surgeon: Consuella Lose, MD;  Location: [facility];  Service: Radiology;  Laterality: N/A;    Family History  Problem Relation Age of Onset  . COPD Sister   . Arthritis Mother   . Alcohol abuse Father       Social History   Social History  . Marital status: Legally Separated    Spouse name: N/A  . Number of children: N/A  . Years of education: N/A   Social History Main Topics  . Smoking status: Former Smoker    Packs/day: 1.00    Types: Cigarettes    Quit date: 12/17/2014  . Smokeless tobacco: Never Used  . Alcohol use No     Comment: EVERYDAY, three beers today  . Drug use: Yes    Types: Marijuana  . Sexual activity: Not Currently    Partners: Male   Other Topics Concern  . Not on file   Social History Narrative  . No narrative on file    Allergies  Allergen Reactions  . Shrimp [Shellfish Allergy] Hives     Current Outpatient Prescriptions:  .  acetaminophen (TYLENOL) 500 MG tablet, Take 1,000 mg by mouth every 6 (six) hours as needed for moderate pain or headache., Disp: , Rfl:  .  fluticasone (FLONASE) 50 MCG/ACT nasal spray, PLACE 2 SPRAYS INTO BOTH NOSTRILS DAILY. (Patient taking differently:  Place 2 sprays into both nostrils 2 (two) times daily. ), Disp: 1 g, Rfl: 0 .  fluticasone (FLONASE) 50 MCG/ACT nasal spray, PLACE 2 SPRAYS INTO BOTH NOSTRILS DAILY., Disp: 16 g, Rfl: 6 .  ketorolac (TORADOL) 10 MG tablet, Take 1 tablet (10 mg total) by mouth every 6 (six) hours as needed., Disp: 20 tablet, Rfl: 0 .  losartan (COZAAR) 50 MG tablet, Take 1 tablet (50 mg total) by mouth daily. (Patient not taking: Reported on 05/12/2016), Disp: 90 tablet, Rfl: 0 .  naproxen (NAPROSYN) 500 MG tablet, Take 1 tablet (500 mg total) by mouth 2 (two) times daily with a meal. (Patient not taking: Reported on 05/12/2016), Disp: 10 tablet, Rfl: 0 .  ondansetron  (ZOFRAN) 4 MG tablet, Take 1 tablet (4 mg total) by mouth every 8 (eight) hours as needed for nausea or vomiting. (Patient not taking: Reported on 05/12/2016), Disp: 20 tablet, Rfl: 0     Review of Systems  Constitutional: Negative for activity change, chills, diaphoresis, fatigue, fever and unexpected weight change.  HENT: Negative for congestion, rhinorrhea, sinus pressure, sneezing, sore throat and trouble swallowing.   Eyes: Negative for photophobia and visual disturbance.  Respiratory: Negative for cough, chest tightness, shortness of breath, wheezing and stridor.   Cardiovascular: Negative for chest pain, palpitations and leg swelling.  Gastrointestinal: Negative for abdominal distention, abdominal pain, anal bleeding, blood in stool, constipation, diarrhea, nausea and vomiting.  Genitourinary: Negative for difficulty urinating, dysuria, flank pain and hematuria.  Musculoskeletal: Positive for arthralgias. Negative for back pain, gait problem and myalgias.  Skin: Negative for color change, pallor, rash and wound.  Neurological: Negative for dizziness, tremors, weakness and light-headedness.  Hematological: Negative for adenopathy. Does not bruise/bleed easily.  Psychiatric/Behavioral: Negative for agitation, behavioral problems, confusion, decreased concentration, dysphoric mood, sleep disturbance and suicidal ideas.       Objective:   Physical Exam  Constitutional: She is oriented to person, place, and time. She appears well-developed and well-nourished. No distress.  HENT:  Head: Normocephalic and atraumatic.  Mouth/Throat: Oropharynx is clear and moist. No oropharyngeal exudate.  Eyes: Conjunctivae and EOM are normal. Pupils are equal, round, and reactive to light. No scleral icterus.  Neck: Normal range of motion. Neck supple. No JVD present.  Cardiovascular: Normal rate, regular rhythm and normal heart sounds.  Exam reveals no gallop and no friction rub.   No murmur  heard. Pulmonary/Chest: Effort normal and breath sounds normal. No respiratory distress. She has no wheezes. She has no rales. She exhibits no tenderness.  Abdominal: She exhibits no distension and no mass. There is no tenderness. There is no rebound and no guarding.  Musculoskeletal: She exhibits no edema or tenderness.  Lymphadenopathy:    She has no cervical adenopathy.  Neurological: She is alert and oriented to person, place, and time. She exhibits normal muscle tone. Coordination normal.  Skin: Skin is warm and dry. She is not diaphoretic. No erythema. No pallor.  Psychiatric: Her behavior is normal. Judgment and thought content normal.          Assessment & Plan:  HIV: Elite controller. I explained again that we DO NOT KNOW whether ELITE controllers benefit from ARV since they can control replication of the HIV virus with their immune system. There is greater immune activation and inflammation in Elite controllers than HIV + who take medications to suppress their viruses, And there is data from the ACT G study that antiretrovirals can reduce this inflammation. She is not ready  to start one yet but perhaps next few months we can convince her to take BIKTARVY  ICH: Going to angiotensin  receptor blocker.  HT: see above. BP not well controlled for her goal. Emphasized close follow-up for blood pressure and to check blood pressure measurements at home  Smoker: stopped since hospitalization. Hopefully stays away for good  Gout flare: Being changed to losartan  I spent greater than 25 minutes with the patient including greater than 50% of time in face to face counsel of the patient re her HIV, being an ELITE controller, her ICH, hypertension smoking and in coordination of their care.

## 2016-07-20 ENCOUNTER — Other Ambulatory Visit: Payer: Self-pay | Admitting: Internal Medicine

## 2016-08-17 ENCOUNTER — Ambulatory Visit (INDEPENDENT_AMBULATORY_CARE_PROVIDER_SITE_OTHER): Payer: BLUE CROSS/BLUE SHIELD | Admitting: Internal Medicine

## 2016-08-17 ENCOUNTER — Encounter: Payer: Self-pay | Admitting: Internal Medicine

## 2016-08-17 ENCOUNTER — Other Ambulatory Visit: Payer: Self-pay | Admitting: Student

## 2016-08-17 ENCOUNTER — Ambulatory Visit
Admission: RE | Admit: 2016-08-17 | Discharge: 2016-08-17 | Disposition: A | Discharge status: Home / Self Care | Payer: BLUE CROSS/BLUE SHIELD | Source: Ambulatory Visit | Attending: Family Medicine | Admitting: Family Medicine

## 2016-08-17 VITALS — BP 122/72 | HR 80 | Temp 98.5°F | Ht 61.75 in | Wt 124.4 lb

## 2016-08-17 DIAGNOSIS — R202 Paresthesia of skin: Secondary | ICD-10-CM

## 2016-08-17 DIAGNOSIS — M25552 Pain in left hip: Secondary | ICD-10-CM

## 2016-08-17 DIAGNOSIS — R200 Anesthesia of skin: Secondary | ICD-10-CM

## 2016-08-17 DIAGNOSIS — M542 Cervicalgia: Secondary | ICD-10-CM | POA: Diagnosis not present

## 2016-08-17 DIAGNOSIS — M25551 Pain in right hip: Secondary | ICD-10-CM | POA: Diagnosis not present

## 2016-08-17 MED ORDER — GABAPENTIN 300 MG PO CAPS: 300.0000 mg | ORAL_CAPSULE | Freq: Every day | ORAL | 0 refills | Status: DC

## 2016-08-17 NOTE — Assessment & Plan Note (Signed)
+  FADIR, so think this is likely arthritis. No red flags. - Bilateral hip x-rays already ordered by previous provider. Encouraged patient to have these done. - Try Tylenol extra strength arthritis.

## 2016-08-17 NOTE — Assessment & Plan Note (Signed)
Think this is likely coming from cervical pathology, given that she has numbness and pain of her entire arm. Shoulder exam normal. Carpal tunnel may be contributing, but do not think this explains all of her symptoms. - Will obtain x-ray of c-spine - Treat with gabapentin qhs - Patient given home rehabilitation exercises. - Follow-up if no improvement in the next 3-4 weeks. Will likely obtain MRI c-spine at that time.

## 2016-08-17 NOTE — Progress Notes (Signed)
   [facility] Phone: [phone]  Subjective:  Connie Wade is a 61 year old female presenting to clinic with pain of her right arm and pain of her right hip.  Right Arm Pain: Has been going on for 3 weeks. Describes the pain as a "dull ache". The pain is better with holding her arm against her body. The pain is worse with moving her arm. The pain is located throughout her entire arm all right around her fingertips. The pain is constant. She has been trying Tylenol, which helps a little bit. She denies any injury to the cervical spine, shoulder, or arm. She also notes numbness throughout her whole right arm, starting in her shoulder and extending down to her fingertips. She has also noticed a weaker grip on her right side. She had asked a neighbor for help opening a jar a few days ago. She feels like the right arm pain and numbness are progressively getting worse. She notes occasional pain in her back and neck.  Right Hip Pain: Has been going on for months. The hip pain was occurring on and off before, but is now constant. She describes the pain as "sharp". The pain is located "deep in her head". The pain is progressively getting worse. Tylenol does not help the pain. She denies any numbness, tingling, or weakness of her right lower extremity. She was seen in clinic 04/2016 for left-sided hip pain. She had bilateral hip x-rays orders, but never had these done because she did not know where she needs to go for the x-ray. No fevers, no chills.  ROS: See HPI for pertinent positives and negatives  Past Medical History- HTN, HIV, hx subarachnoid hemorrhage, HLD, depression.  Family history reviewed for today's visit. No changes.  Social history- patient is a former smoker  Objective: BP 122/72 (BP Location: Right Arm, Patient Position: Sitting, Cuff Size: Normal)   Pulse 80   Temp 98.5 F (36.9 C) (Oral)   Ht 5' 1.75" (1.568 m)   Wt 124 lb 6.4 oz (56.4 kg)   SpO2 99%   BMI  22.94 kg/m  Gen: NAD, alert, cooperative with exam HEENT: NCAT, EOMI, MMM Neck: FROM, supple Right Arm: No tenderness to palpation over the shoulder, elbow, or wrist. Normal ROM in the shoulder. Negative Hawkin's test, negative empty can test, negative O'Brien's test, negative Speed's test, negative drop arm test, negative lift-off test.  Right Hip: No tenderness to palpation of the lateral hip, gluteal muscles, or lower back. Normal ROM. +FADIR, negative FABER. Neuro: Spurling's test negative bilaterally, negative Tinnel's sign, positive Phalen's sign, +decreased sensation to light touch over the right arm, 4/5 grip strength on the right, 5/5 grip strength on the left, 5/5 muscle strength in the arm.  Assessment/Plan: Right Arm Pain/numbness: Think this is likely coming from cervical pathology, given that she has numbness and pain of her entire arm. Shoulder exam normal. Carpal tunnel may be contributing, but do not think this explains all of her symptoms. - Will obtain x-ray of c-spine - Treat with gabapentin qhs - Patient given home rehabilitation exercises. - Follow-up if no improvement in the next 3-4 weeks. Will likely obtain MRI c-spine at that time.  Right Hip Pain: +FADIR, so think this is likely arthritis. No red flags. - Bilateral hip x-rays already ordered by previous provider. Encouraged patient to have these done. - Try Tylenol extra strength arthritis.   Hyman Bible, MD PGY-3

## 2016-08-17 NOTE — Patient Instructions (Signed)
It was so nice to see you!  For your arm pain, I have ordered an x-ray of your neck. If this pain and numbness does not get better in the next 3-4 weeks, please give me a call and we can order an MRI for you. I have prescribed Gabapentin, which is a medication that helps nerve pain. Please take 1 tablet at night as needed.  For your hip pain, I have ordered x-rays of your hips. You can try Tylenol extra strength arthritis.  -Dr. Brett Albino

## 2016-08-21 ENCOUNTER — Other Ambulatory Visit: Payer: Self-pay | Admitting: Internal Medicine

## 2016-10-07 ENCOUNTER — Other Ambulatory Visit: Payer: Self-pay | Admitting: Internal Medicine

## 2016-11-11 ENCOUNTER — Other Ambulatory Visit: Payer: Self-pay | Admitting: Internal Medicine

## 2016-11-13 DIAGNOSIS — D126 Benign neoplasm of colon, unspecified: Secondary | ICD-10-CM | POA: Diagnosis not present

## 2016-11-13 DIAGNOSIS — Z86010 Personal history of colonic polyps: Secondary | ICD-10-CM | POA: Diagnosis not present

## 2016-11-13 DIAGNOSIS — K5730 Diverticulosis of large intestine without perforation or abscess without bleeding: Secondary | ICD-10-CM | POA: Diagnosis not present

## 2016-11-17 DIAGNOSIS — D126 Benign neoplasm of colon, unspecified: Secondary | ICD-10-CM | POA: Diagnosis not present

## 2016-12-22 ENCOUNTER — Other Ambulatory Visit: Payer: Self-pay | Admitting: Internal Medicine

## 2017-01-01 ENCOUNTER — Telehealth: Payer: Self-pay | Admitting: *Deleted

## 2017-01-01 NOTE — Telephone Encounter (Signed)
Patient had metal tree stand fall on top of left foot several days ago. Now with significant bruise. Reports only mild tenderness. Foot is warm. Denies numbness or difficulty with movement. Reassured. Patient will go to UC or Ed if foot becomes cold or numb. Hubbard Hartshorn, RN, BSN

## 2017-01-06 NOTE — Telephone Encounter (Signed)
Thank you, Lauren. Agree with your advice!

## 2017-03-12 ENCOUNTER — Telehealth: Payer: Self-pay | Admitting: Psychology

## 2017-03-12 NOTE — Telephone Encounter (Signed)
Patient left a VM.  I couldn't understand it fully.  Called her back and left a VM asking for a return call.

## 2017-04-08 ENCOUNTER — Ambulatory Visit (INDEPENDENT_AMBULATORY_CARE_PROVIDER_SITE_OTHER): Payer: BLUE CROSS/BLUE SHIELD | Admitting: Internal Medicine

## 2017-04-08 ENCOUNTER — Other Ambulatory Visit: Payer: Self-pay

## 2017-04-08 ENCOUNTER — Encounter: Payer: Self-pay | Admitting: Internal Medicine

## 2017-04-08 DIAGNOSIS — L02818 Cutaneous abscess of other sites: Secondary | ICD-10-CM | POA: Diagnosis not present

## 2017-04-08 DIAGNOSIS — L0291 Cutaneous abscess, unspecified: Secondary | ICD-10-CM | POA: Insufficient documentation

## 2017-04-08 NOTE — Patient Instructions (Signed)
It was so nice to see you today!  We opened your boil back up. You may notice some blood and pus draining out of it over the next couple of days. Please continue to use warm compresses to the area many times throughout the day.  If you start having fevers and chills or feeling really sick, please come back to see [place]!  -Dr. Brett Albino

## 2017-04-08 NOTE — Progress Notes (Signed)
   [facility] Phone: [phone]  Subjective:  Connie Wade is a 62 year old female presenting to clinic with a boil on her back. She noticed it one week ago. The boil is located in the middle of her back. The boil started draining 3 days ago. Pus and blood came out. Her cousin has been putting antibiotic ointment, alcohol, and hydrogen peroxide on the boil, which has been helping. No fevers, no chills, no nausea, no vomiting.  ROS: See HPI for pertinent positives and negatives  Past Medical History- HTN, hep B, hx ruptured aneurysm with subarachnoid hemorrhage, depression, HLD, HIV  Family history reviewed for today's visit. No changes.  Social history- patient is a former smoker, quit in 2016  Objective: BP 128/70   Pulse 69   Temp 98.6 F (37 C) (Oral)   Wt 121 lb (54.9 kg)   SpO2 99%   BMI 22.31 kg/m  Gen: NAD, alert, cooperative with exam Skin: 2cm x 2cm boil present in the mid back, fluctuance present, no overlying erythema, no drainage.  Assessment/Plan: Skin Abscess: Present on the mid back. Has been draining, but closed up. Fluctuance and discomfort still present, so I&D performed in clinic today. See procedure note. No associated cellulitis or systemic symptoms, so do not think patient needs antibiotics at this time. Patient given instructions for after care. Return precautions discussed.   Procedure: I&D abscess -Informed consent was obtained. -Timeout performed -The mid back was prepped with sterile iodine in the usual fashion. -An 11 blade was used to make a 1cm incision across the region of fluctuance. -Purulent drainage was expressed from the abscess. The area was probed to free any loculations. -The wound was not packed with iodoform gauze. -We discussed wound cares, including frequent application of warm compresses 6 times a day. -We discussed reasons to call or return to the clinic sooner, including increasing pain, fever, or  bleeding.   Hyman Bible, MD PGY-3

## 2017-04-08 NOTE — Assessment & Plan Note (Signed)
Present on the mid back. Has been draining, but closed up. Fluctuance and discomfort still present, so I&D performed in clinic today. See procedure note. No associated cellulitis or systemic symptoms, so do not think patient needs antibiotics at this time. Patient given instructions for after care. Return precautions discussed.

## 2017-04-19 ENCOUNTER — Other Ambulatory Visit: Payer: Self-pay | Admitting: *Deleted

## 2017-04-21 MED ORDER — LOSARTAN POTASSIUM 50 MG PO TABS: 50.0000 mg | ORAL_TABLET | Freq: Every day | ORAL | 0 refills | Status: DC

## 2017-05-12 ENCOUNTER — Emergency Department (HOSPITAL_COMMUNITY)
Admission: EM | Admit: 2017-05-12 | Discharge: 2017-05-12 | Disposition: A | Discharge status: Home / Self Care | Payer: BLUE CROSS/BLUE SHIELD | Attending: Emergency Medicine | Admitting: Emergency Medicine

## 2017-05-12 ENCOUNTER — Encounter (HOSPITAL_COMMUNITY): Payer: Self-pay | Admitting: Emergency Medicine

## 2017-05-12 ENCOUNTER — Ambulatory Visit (HOSPITAL_COMMUNITY)
Admission: EM | Admit: 2017-05-12 | Discharge: 2017-05-12 | Disposition: A | Discharge status: Home / Self Care | Payer: BLUE CROSS/BLUE SHIELD | Source: Home / Self Care

## 2017-05-12 ENCOUNTER — Other Ambulatory Visit: Payer: Self-pay

## 2017-05-12 DIAGNOSIS — R112 Nausea with vomiting, unspecified: Secondary | ICD-10-CM | POA: Diagnosis not present

## 2017-05-12 DIAGNOSIS — A084 Viral intestinal infection, unspecified: Secondary | ICD-10-CM | POA: Diagnosis not present

## 2017-05-12 DIAGNOSIS — R197 Diarrhea, unspecified: Secondary | ICD-10-CM | POA: Diagnosis not present

## 2017-05-12 DIAGNOSIS — I10 Essential (primary) hypertension: Secondary | ICD-10-CM | POA: Insufficient documentation

## 2017-05-12 DIAGNOSIS — Z79899 Other long term (current) drug therapy: Secondary | ICD-10-CM | POA: Insufficient documentation

## 2017-05-12 DIAGNOSIS — Z87891 Personal history of nicotine dependence: Secondary | ICD-10-CM | POA: Insufficient documentation

## 2017-05-12 DIAGNOSIS — R1084 Generalized abdominal pain: Secondary | ICD-10-CM

## 2017-05-12 LAB — URINALYSIS, ROUTINE W REFLEX MICROSCOPIC
Bilirubin Urine: NEGATIVE
GLUCOSE, UA: NEGATIVE mg/dL
KETONES UR: 80 mg/dL — AB
Leukocytes, UA: NEGATIVE
NITRITE: NEGATIVE
PH: 5 (ref 5.0–8.0)
Specific Gravity, Urine: 1.02 (ref 1.005–1.030)

## 2017-05-12 LAB — COMPREHENSIVE METABOLIC PANEL
ALBUMIN: 4.3 g/dL (ref 3.5–5.0)
ALK PHOS: 69 U/L (ref 38–126)
ALT: 20 U/L (ref 14–54)
ANION GAP: 15 (ref 5–15)
AST: 24 U/L (ref 15–41)
BUN: 6 mg/dL (ref 6–20)
CALCIUM: 9.3 mg/dL (ref 8.9–10.3)
CO2: 19 mmol/L — AB (ref 22–32)
Chloride: 102 mmol/L (ref 101–111)
Creatinine, Ser: 0.71 mg/dL (ref 0.44–1.00)
GFR calc non Af Amer: >60 mL/min (ref >60)
GLUCOSE: 112 mg/dL — AB (ref 65–99)
Potassium: 3.7 mmol/L (ref 3.5–5.1)
SODIUM: 136 mmol/L (ref 135–145)
TOTAL PROTEIN: 8.3 g/dL — AB (ref 6.5–8.1)
Total Bilirubin: 0.9 mg/dL (ref 0.3–1.2)

## 2017-05-12 LAB — CBC
HEMATOCRIT: 37.1 % (ref 36.0–46.0)
Hemoglobin: 12.8 g/dL (ref 12.0–15.0)
MCH: 25.4 pg — AB (ref 26.0–34.0)
MCHC: 34.5 g/dL (ref 30.0–36.0)
MCV: 73.6 fL — ABNORMAL LOW (ref 78.0–100.0)
Platelets: 447 10*3/uL — ABNORMAL HIGH (ref 150–400)
RBC: 5.04 MIL/uL (ref 3.87–5.11)
RDW: 16.9 % — ABNORMAL HIGH (ref 11.5–15.5)
WBC: 6.8 10*3/uL (ref 4.0–10.5)

## 2017-05-12 LAB — ACETAMINOPHEN LEVEL

## 2017-05-12 LAB — LIPASE, BLOOD: LIPASE: 31 U/L (ref 11–51)

## 2017-05-12 MED ORDER — ONDANSETRON 4 MG PO TBDP
4.0000 mg | ORAL_TABLET | Freq: Once | ORAL | Status: AC
  Administered 2017-05-12: 4 mg via ORAL
  Filled 2017-05-12: qty 1

## 2017-05-12 MED ORDER — PROMETHAZINE HCL 25 MG/ML IJ SOLN: 25.0000 mg | Freq: Once | INTRAMUSCULAR | Status: DC

## 2017-05-12 MED ORDER — PROMETHAZINE HCL 25 MG/ML IJ SOLN
12.5000 mg | Freq: Once | INTRAMUSCULAR | Status: AC
  Administered 2017-05-12: 12.5 mg via INTRAVENOUS
  Filled 2017-05-12: qty 1

## 2017-05-12 MED ORDER — KETOROLAC TROMETHAMINE 15 MG/ML IJ SOLN
15.0000 mg | Freq: Once | INTRAMUSCULAR | Status: AC
  Administered 2017-05-12: 15 mg via INTRAVENOUS
  Filled 2017-05-12: qty 1

## 2017-05-12 MED ORDER — SODIUM CHLORIDE 0.9 % IV BOLUS
500.0000 mL | Freq: Once | INTRAVENOUS | Status: AC
  Administered 2017-05-12: 500 mL via INTRAVENOUS

## 2017-05-12 NOTE — ED Notes (Signed)
ED Provider at bedside. 

## 2017-05-12 NOTE — Discharge Instructions (Signed)
Given the amount of tylenol you have taken, now with nausea/vomiting, diarrhea, and very tender abdomen, please go to the emergency department for further evaluation needed.

## 2017-05-12 NOTE — ED Triage Notes (Signed)
Pt sts N/V/D x 2 days 

## 2017-05-12 NOTE — ED Provider Notes (Signed)
[facility] EMERGENCY DEPARTMENT Provider Note   CSN: [id number] Arrival date & time: 05/12/17  1625  History   Chief Complaint Chief Complaint  Patient presents with  . Abdominal Pain  . Nausea  . Diarrhea    HPI Connie Wade is a 62 y.o. female.  HPI     62 year old female with a history of HIV, hyperlipidemia, hepatitis B, hypertension complaints of nausea vomiting and diarrhea.  Patient reports 2 days ago she developed nausea vomiting and diarrhea.  She notes taking Tylenol at home.  Per chart review she took 1000 mg every 4 hours, but has not taken any in the last 2 days.  Patient notes that symptoms have improved, but still has some nonbloody nonbilious emesis and nonbloody diarrhea.  Patient notes some very minor generalized abdominal pain nonfocal that has improved as well.  Patient denies any close sick contacts, denies any abnormal exposures or recent antibiotics.  He was seen in urgent care and referred to the emergency room. Pt denies any chest pain or SOB.   Patient is HIV positive with a viral load in April 2018 with less than 20 detected HIV, and a CD4 count greater than 1000.     Past Medical History:  Diagnosis Date  . ANEMIA, OTHER, UNSPECIFIED 03/25/2006  . DEPRESSION 03/17/2007  . HEPATITIS B 03/25/2006  . HIV 03/25/2006   since 2000  . HYDRADENITIS 03/25/2006  . HYPERLIPIDEMIA 03/25/2006  . HYPERTENSION, BENIGN ESSENTIAL 04/26/2007  . TOBACCO DEPENDENCE 03/25/2006  . Tubular adenoma of colon 06/23/2011   Benign, no high grade dysplasia 06/16/11 Followed by [name] Dr. Watt Climes Repeat in 5 years (due 2018)  . UTERINE FIBROID 03/25/2006    Patient Active Problem List   Diagnosis Date Noted  . Skin abscess 04/08/2017  . Right arm numbness 08/17/2016  . Right hip pain 05/12/2016  . Gout 04/27/2016  . Right shoulder pain 01/17/2016  . Back skin lesion 01/17/2016  . Health care maintenance 01/17/2016  . Headache 08/30/2015  . Subarachnoid  hemorrhage due to ruptured aneurysm ([place]) 12/04/2014  . Tobacco abuse 09/19/2012  . Tubular adenoma of colon 06/23/2011  . HYPERTENSION, BENIGN ESSENTIAL 04/26/2007  . Depression 03/17/2007  . Degenerative disc disease, lumbar 03/17/2007  . Human immunodeficiency virus (HIV) disease ([place]) 03/25/2006  . HEPATITIS B 03/25/2006  . UTERINE FIBROID 03/25/2006  . HLD (hyperlipidemia) 03/25/2006  . ANEMIA, OTHER, UNSPECIFIED 03/25/2006  . Allergic rhinitis 03/25/2006  . MENOPAUSAL SYNDROME 03/25/2006    Past Surgical History:  Procedure Laterality Date  . coil     anuerysm  . IR GENERIC HISTORICAL  07/26/2015   IR ANGIO INTRA EXTRACRAN SEL INTERNAL CAROTID BILAT MOD SED 07/26/2015 Consuella Lose, MD [facility]  . IR GENERIC HISTORICAL  07/26/2015   IR ANGIO VERTEBRAL SEL VERTEBRAL BILAT MOD SED 07/26/2015 Consuella Lose, MD [facility]  . OVARIAN CYST REMOVAL  2001  . RADIOLOGY WITH ANESTHESIA N/A 12/05/2014   Procedure: RADIOLOGY WITH ANESTHESIA-COING FOR SUBARACHNOID HEMORRHAGE;  Surgeon: Consuella Lose, MD;  Location: [facility];  Service: Radiology;  Laterality: N/A;     OB History   None      Home Medications    Prior to Admission medications   Medication Sig Start Date End Date Taking? Authorizing Provider  acetaminophen (TYLENOL) 500 MG tablet Take 1,000 mg by mouth every 6 (six) hours as needed for moderate pain or headache.    [provider]  fluticasone (FLONASE) 50 MCG/ACT nasal spray PLACE  2 SPRAYS INTO BOTH NOSTRILS DAILY. Patient taking differently: Place 2 sprays into both nostrils 2 (two) times daily.  01/15/16 05/12/17  Mayo, Pete Pelt, MD  fluticasone (FLONASE) 50 MCG/ACT nasal spray PLACE 2 SPRAYS INTO BOTH NOSTRILS DAILY. 08/21/16   Mayo, Pete Pelt, MD  gabapentin (NEURONTIN) 300 MG capsule TAKE 1 CAPSULE (300 MG TOTAL) BY MOUTH AT BEDTIME. 11/12/16   Mikell, Jeani Sow, MD  ketorolac (TORADOL) 10 MG tablet Take 1 tablet (10 mg total) by mouth  every 6 (six) hours as needed. 05/12/16   Haney, Amedeo Plenty, MD  losartan (COZAAR) 50 MG tablet Take 1 tablet (50 mg total) by mouth daily. 04/21/17   Mayo, Pete Pelt, MD  naproxen (NAPROSYN) 500 MG tablet Take 1 tablet (500 mg total) by mouth 2 (two) times daily with a meal. 04/23/16   Mayo, Pete Pelt, MD  ondansetron (ZOFRAN) 4 MG tablet Take 1 tablet (4 mg total) by mouth every 8 (eight) hours as needed for nausea or vomiting. 02/29/16   Valinda Party, DO    Family History Family History  Problem Relation Age of Onset  . COPD Sister   . Arthritis Mother   . Alcohol abuse Father     Social History Social History   Tobacco Use  . Smoking status: Former Smoker    Packs/day: 1.00    Types: Cigarettes    Last attempt to quit: 12/17/2014    Years since quitting: 2.4  . Smokeless tobacco: Never Used  Substance Use Topics  . Alcohol use: No    Alcohol/week: 0.6 oz    Types: 1 Glasses of wine per week    Comment: EVERYDAY, three beers today  . Drug use: Yes    Types: Marijuana     Allergies   Shrimp [shellfish allergy]   Review of Systems Review of Systems  All other systems reviewed and are negative.  Physical Exam Updated Vital Signs BP (!) 171/94 (BP Location: Right Arm)   Pulse 96   Temp 98.5 F (36.9 C) (Oral)   Resp 18   Ht 5' 0.75" (1.543 m)   Wt 52.2 kg (115 lb)   SpO2 100%   BMI 21.91 kg/m   Physical Exam  Constitutional: She is oriented to person, place, and time. She appears well-developed and well-nourished.  HENT:  Head: Normocephalic and atraumatic.  Eyes: Pupils are equal, round, and reactive to light. Conjunctivae are normal. Right eye exhibits no discharge. Left eye exhibits no discharge. No scleral icterus.  Neck: Normal range of motion. No JVD present. No tracheal deviation present.  Pulmonary/Chest: Effort normal. No stridor.  Abdominal: Soft. She exhibits no distension and no mass. There is no tenderness. There is no rebound and no  guarding. No hernia.  Neurological: She is alert and oriented to person, place, and time. Coordination normal.  Psychiatric: She has a normal mood and affect. Her behavior is normal. Judgment and thought content normal.  Nursing note and vitals reviewed.    ED Treatments / Results  Labs (all labs ordered are listed, but only abnormal results are displayed) Labs Reviewed  COMPREHENSIVE METABOLIC PANEL - Abnormal; Notable for the following components:      Result Value   CO2 19 (*)    Glucose, Bld 112 (*)    Total Protein 8.3 (*)    All other components within normal limits  CBC - Abnormal; Notable for the following components:   MCV 73.6 (*)    MCH 25.4 (*)  RDW 16.9 (*)    Platelets 447 (*)    All other components within normal limits  URINALYSIS, ROUTINE W REFLEX MICROSCOPIC - Abnormal; Notable for the following components:   Color, Urine AMBER (*)    APPearance HAZY (*)    Hgb urine dipstick MODERATE (*)    Ketones, ur 80 (*)    Protein, ur >=300 (*)    Bacteria, UA RARE (*)    Squamous Epithelial / LPF 0-5 (*)    All other components within normal limits  ACETAMINOPHEN LEVEL - Abnormal; Notable for the following components:   Acetaminophen (Tylenol), Serum <10 (*)    All other components within normal limits  LIPASE, BLOOD    EKG None  Radiology No results found.  Procedures Procedures (including critical care time)  Medications Ordered in ED Medications  ondansetron (ZOFRAN-ODT) disintegrating tablet 4 mg (4 mg Oral Given 05/12/17 1949)  promethazine (PHENERGAN) injection 12.5 mg (12.5 mg Intravenous Given 05/12/17 2111)  sodium chloride 0.9 % bolus 500 mL (0 mLs Intravenous Stopped 05/12/17 2146)  ketorolac (TORADOL) 15 MG/ML injection 15 mg (15 mg Intravenous Given 05/12/17 2144)     Initial Impression / Assessment and Plan / ED Course  I have reviewed the triage vital signs and the nursing notes.  Pertinent labs & imaging results that were available  during my care of the patient were reviewed by me and considered in my medical decision making (see chart for details).     Labs: Urinalysis, lipase, CMP, CBC, acetaminophen  Imaging:  Consults:  Therapeutics: Zofran, promethazine  Discharge Meds: Patient has Zofran at home  Assessment/Plan: 62 year old female presents today with likely viral gastroenteritis.  She has nausea vomiting and diarrhea.  She was given Zofran which improved her symptoms but still with some minor vomiting.  Promethazine was given which seemed to improve her nausea.  Patient was given a small bolus of fluids here.  Patient has no significant elevation in white count, afebrile with a soft nontender abdomen.  I have low suspicion for any acute intra-abdominal pathology on this patient including appendicitis, diverticulitis, mesenteric ischemia, or any other concerning etiology.  Patient will contact her primary care provider and schedule follow-up evaluation this week, she will return to emergency room if she has any new or worsening signs or symptoms.  Patient verbalized understanding and agreement to today's plan had no further questions or concerns at the time of discharge.   Final Clinical Impressions(s) / ED Diagnoses   Final diagnoses:  Viral gastroenteritis    ED Discharge Orders    None       Francee Gentile 05/12/17 2205    Nat Christen, MD 05/13/17 [phone]

## 2017-05-12 NOTE — ED Triage Notes (Signed)
Pt presents with abd pain and n/v/d since the weekend; pt states she has been taking a lot of tylenol for the pain; pt originally went to [facility] for treatment, they sent her here for further treatment

## 2017-05-12 NOTE — ED Provider Notes (Signed)
[facility]    CSN: [id number] Arrival date & time: 05/12/17  1516     History   Chief Complaint Chief Complaint  Patient presents with  . Emesis    HPI Harbor Paster is a 62 y.o. female.   62 year old female with history of HIV, HLD, hepatitis B, HTN comes in for 2-day history of nausea, vomiting, diarrhea.  States prior to symptom onset, she took Tylenol 1000 mg every 2-4 hours, and had taken about 15-20 tablets for the day.  She does started with nausea and vomiting every 15 minutes, that has slightly slowed down, last emesis 3 hours ago.  She is also had multiple episodes of diarrhea, has had around the same frequency.  Denies blood in stool, blood in vomit. Has generalized abdominal pain.  Has not been able to eat or drink since symptom onset.  Patient states she is an elite controller for HIV, and does not take any medication for it.  Last viral load 04/2016 with less than 20 detected HIV, CD4 count greater than 1000.     Past Medical History:  Diagnosis Date  . ANEMIA, OTHER, UNSPECIFIED 03/25/2006  . DEPRESSION 03/17/2007  . HEPATITIS B 03/25/2006  . HIV 03/25/2006   since 2000  . HYDRADENITIS 03/25/2006  . HYPERLIPIDEMIA 03/25/2006  . HYPERTENSION, BENIGN ESSENTIAL 04/26/2007  . TOBACCO DEPENDENCE 03/25/2006  . Tubular adenoma of colon 06/23/2011   Benign, no high grade dysplasia 06/16/11 Followed by [name] Dr. Watt Climes Repeat in 5 years (due 2018)  . UTERINE FIBROID 03/25/2006    Patient Active Problem List   Diagnosis Date Noted  . Skin abscess 04/08/2017  . Right arm numbness 08/17/2016  . Right hip pain 05/12/2016  . Gout 04/27/2016  . Right shoulder pain 01/17/2016  . Back skin lesion 01/17/2016  . Health care maintenance 01/17/2016  . Headache 08/30/2015  . Subarachnoid hemorrhage due to ruptured aneurysm ([place]) 12/04/2014  . Tobacco abuse 09/19/2012  . Tubular adenoma of colon 06/23/2011  . HYPERTENSION, BENIGN ESSENTIAL 04/26/2007  . Depression  03/17/2007  . Degenerative disc disease, lumbar 03/17/2007  . Human immunodeficiency virus (HIV) disease ([place]) 03/25/2006  . HEPATITIS B 03/25/2006  . UTERINE FIBROID 03/25/2006  . HLD (hyperlipidemia) 03/25/2006  . ANEMIA, OTHER, UNSPECIFIED 03/25/2006  . Allergic rhinitis 03/25/2006  . MENOPAUSAL SYNDROME 03/25/2006    Past Surgical History:  Procedure Laterality Date  . IR GENERIC HISTORICAL  07/26/2015   IR ANGIO INTRA EXTRACRAN SEL INTERNAL CAROTID BILAT MOD SED 07/26/2015 [facility] Lose, MD [facility]  . IR GENERIC HISTORICAL  07/26/2015   IR ANGIO VERTEBRAL SEL VERTEBRAL BILAT MOD SED 07/26/2015 Consuella Lose, MD [facility]  . OVARIAN CYST REMOVAL  2001  . RADIOLOGY WITH ANESTHESIA N/A 12/05/2014   Procedure: RADIOLOGY WITH ANESTHESIA-COING FOR SUBARACHNOID HEMORRHAGE;  Surgeon: Consuella Lose, MD;  Location: [facility];  Service: Radiology;  Laterality: N/A;    OB History   None      Home Medications    Prior to Admission medications   Medication Sig Start Date End Date Taking? Authorizing Provider  acetaminophen (TYLENOL) 500 MG tablet Take 1,000 mg by mouth every 6 (six) hours as needed for moderate pain or headache.    [provider]  fluticasone (FLONASE) 50 MCG/ACT nasal spray PLACE 2 SPRAYS INTO BOTH NOSTRILS DAILY. Patient taking differently: Place 2 sprays into both nostrils 2 (two) times daily.  01/15/16 07/13/16  Mayo, Pete Pelt, MD  fluticasone (FLONASE) 50 MCG/ACT nasal spray  PLACE 2 SPRAYS INTO BOTH NOSTRILS DAILY. 08/21/16   Mayo, Pete Pelt, MD  gabapentin (NEURONTIN) 300 MG capsule TAKE 1 CAPSULE (300 MG TOTAL) BY MOUTH AT BEDTIME. 11/12/16   Mikell, Jeani Sow, MD  ketorolac (TORADOL) 10 MG tablet Take 1 tablet (10 mg total) by mouth every 6 (six) hours as needed. 05/12/16   Haney, Amedeo Plenty, MD  losartan (COZAAR) 50 MG tablet Take 1 tablet (50 mg total) by mouth daily. 04/21/17   Mayo, Pete Pelt, MD  naproxen (NAPROSYN) 500 MG tablet Take 1  tablet (500 mg total) by mouth 2 (two) times daily with a meal. 04/23/16   Mayo, Pete Pelt, MD  ondansetron (ZOFRAN) 4 MG tablet Take 1 tablet (4 mg total) by mouth every 8 (eight) hours as needed for nausea or vomiting. 02/29/16   Valinda Party, DO    Family History Family History  Problem Relation Age of Onset  . COPD Sister   . Arthritis Mother   . Alcohol abuse Father     Social History Social History   Tobacco Use  . Smoking status: Former Smoker    Packs/day: 1.00    Types: Cigarettes    Last attempt to quit: 12/17/2014    Years since quitting: 2.4  . Smokeless tobacco: Never Used  Substance Use Topics  . Alcohol use: No    Alcohol/week: 0.6 oz    Types: 1 Glasses of wine per week    Comment: EVERYDAY, three beers today  . Drug use: Yes    Types: Marijuana     Allergies   Shrimp [shellfish allergy]   Review of Systems Review of Systems  Reason unable to perform ROS: See HPI as above.     Physical Exam Triage Vital Signs ED Triage Vitals [05/12/17 1552]  Enc Vitals Group     BP (!) 155/89     Pulse Rate (!) 103     Resp 18     Temp 100.3 F (37.9 C)     Temp Source Oral     SpO2 95 %     Weight      Height      Head Circumference      Peak Flow      Pain Score      Pain Loc      Pain Edu?      Excl. in [place]?    No data found.  Updated Vital Signs BP (!) 155/89 (BP Location: Left Arm)   Pulse (!) 103   Temp 100.3 F (37.9 C) (Oral)   Resp 18   SpO2 95%    Physical Exam  Constitutional: She is oriented to person, place, and time. She appears well-developed and well-nourished. No distress.  HENT:  Head: Normocephalic and atraumatic.  Eyes: Pupils are equal, round, and reactive to light. Conjunctivae are normal. No scleral icterus.  Cardiovascular: Normal rate, regular rhythm and normal heart sounds. Exam reveals no gallop and no friction rub.  No murmur heard. Pulmonary/Chest: Effort normal and breath sounds normal. No accessory  muscle usage or stridor. No respiratory distress. She has no decreased breath sounds. She has no wheezes. She has no rhonchi. She has no rales.  Abdominal: Soft. Bowel sounds are increased.  Patient very tender to light touch throughout abdomen. Guarding throughout. Unable to adequately assess rebound given guarding.   Neurological: She is alert and oriented to person, place, and time.  Skin: Skin is warm and dry.  Psychiatric: She has a  normal mood and affect. Her behavior is normal. Judgment normal.     UC Treatments / Results  Labs (all labs ordered are listed, but only abnormal results are displayed) Labs Reviewed - No data to display  EKG None Radiology No results found.  Procedures Procedures (including critical care time)  Medications Ordered in UC Medications - No data to display   Initial Impression / Assessment and Plan / UC Course  I have reviewed the triage vital signs and the nursing notes.  Pertinent labs & imaging results that were available during my care of the patient were reviewed by me and considered in my medical decision making (see chart for details).    Discussed case with Dr Joseph Art. 62 year old female with history of HIV, HLD, hepatitis B, HTN comes in for 2-day history of nausea, vomiting, diarrhea.  She had a total of 8-10g of Tylenol 2 days ago prior to symptom onset.  She has since then had multiple episodes of nausea, vomiting, diarrhea ranging from about 1 episode every 15 minutes.  She has had generalized abdominal pain.  Has not been able to tolerate food or liquid intake.  Patient is afebrile, with mild tachycardia on exam.  She is very tender to palpation throughout the abdomen with generalized guarding.  Unable to adequately assess rebound.  Given history and exam, will discharge patient in stable condition to the emergency department for further evaluation.  Case discussed with Dr Joseph Art, who agrees to plan.   Final Clinical  Impressions(s) / UC Diagnoses   Final diagnoses:  Non-intractable vomiting with nausea, unspecified vomiting type  Generalized abdominal pain    ED Discharge Orders    None        Ok Edwards, PA-C 05/12/17 1626

## 2017-05-13 ENCOUNTER — Ambulatory Visit: Payer: BLUE CROSS/BLUE SHIELD | Admitting: Internal Medicine

## 2017-05-19 ENCOUNTER — Ambulatory Visit
Admission: RE | Admit: 2017-05-19 | Discharge: 2017-05-19 | Disposition: A | Discharge status: Home / Self Care | Payer: BLUE CROSS/BLUE SHIELD | Source: Ambulatory Visit | Attending: Family Medicine | Admitting: Family Medicine

## 2017-05-19 ENCOUNTER — Ambulatory Visit (INDEPENDENT_AMBULATORY_CARE_PROVIDER_SITE_OTHER): Payer: BLUE CROSS/BLUE SHIELD | Admitting: Internal Medicine

## 2017-05-19 ENCOUNTER — Encounter: Payer: Self-pay | Admitting: Internal Medicine

## 2017-05-19 VITALS — BP 140/82 | HR 74 | Temp 98.7°F | Wt 119.0 lb

## 2017-05-19 DIAGNOSIS — R05 Cough: Secondary | ICD-10-CM

## 2017-05-19 DIAGNOSIS — R059 Cough, unspecified: Secondary | ICD-10-CM

## 2017-05-19 DIAGNOSIS — M898X1 Other specified disorders of bone, shoulder: Secondary | ICD-10-CM | POA: Diagnosis not present

## 2017-05-19 MED ORDER — GABAPENTIN 300 MG PO CAPS: 300.0000 mg | ORAL_CAPSULE | Freq: Every day | ORAL | 0 refills | Status: DC

## 2017-05-19 NOTE — Patient Instructions (Signed)
It was so nice to see you today!  I have ordered a chest x-ray so that we can look at your chest and shoulder. You can go to [place] at [facility]. [address] to have this done.  I have also prescribed a medication called Gabapentin to help with the pain. Please take 1 tablet at night.  If your pain is not getting better over the next 4 weeks, please come back to see [place]. We would need to see you back sooner if you were having chest pain, shortness of breath, or if the pain starts to get worse when you walk around.  -Dr. Brett Albino

## 2017-05-23 DIAGNOSIS — M898X1 Other specified disorders of bone, shoulder: Secondary | ICD-10-CM | POA: Insufficient documentation

## 2017-05-23 NOTE — Progress Notes (Signed)
   [facility] Phone: [phone]  Subjective:  Connie Wade is a 62 year old female presenting to clinic with left shoulder blade pain for the last 2 weeks. The pain is "achy". The pain is worse at night and with sitting. Not worse with movement or walking. Better with heat. Has been trying Tylenol, but this hasn't been helping. Endorses cough productive of sputum and "foam" that lasted for 2 days but then resolved on its own. Also notes some numbness and a 1-2 bumps on her left axilla and posterior right arm. No shortness of breath, no chest pain, no fevers.  ROS: See HPI for pertinent positives and negatives  Past Medical History- HTN, allergic rhinitis, HIV, HLD, depression, hx subarachnoid hemorrhage, hx hepatitis B, gout  Family history reviewed for today's visit. No changes.  Social history- patient is a former smoker, quit in 2016  Objective: BP 140/82   Pulse 74   Temp 98.7 F (37.1 C) (Oral)   Wt 119 lb (54 kg)   BMI 22.67 kg/m  Gen: NAD, alert, cooperative with exam HEENT: NCAT, EOMI, MMM Neck: FROM, supple CV: RRR, no murmur Resp: CTABL, no wheezes, normal work of breathing GI: SNTND, BS present, no guarding or organomegaly Msk: No edema, warm, normal tone, moves UE/LE spontaneously; no tenderness to palpation of the left scaphoid, + mild tenderness to palpation of the rhomboids on the left Neuro: Alert and oriented, no gross deficits, decreased sensation to light touch in the posterior upper left arm, Spurling's test negative bilaterally Skin: 1 small erythematous papule on the inside of the left upper arm.  Assessment/Plan: Left Shoulder Blade Pain: Likely musculoskeletal, given that patient describes this as an "achy" pain that improves with heat. Think cardiac etiology is less likely, as pain is not worse with exertion and patient denies any associated chest pain or shortness of breath. Pulmonary etiology considered because patient did have some  cough productive of sputum, but it resolved on its own and lungs are clear today. PE unlikely with wells score of 0, no shortness of breath, no tachycardia, and no lower extremity edema. Radicular pain is also a consideration, especially given patient's associated numbness of the left posterior arm. Recent x-ray c-spine 07/2016 with degenerative changes of the mid and lower c-spine (patient was having right arm and shoulder pain when that x-ray was ordered). Also considered prodrome to shingles, given that patient has associated numbness of the posterior/medial arm and does have one erythematous papule present in that area. - Will order CXR to rule out pulmonary or bony etiology - Trial of Gabapentin 300mg  qhs - Return precautions discussed, including advising patient to return to clinic if painful rash develops - If no improvement over the next couple of weeks, could consider MRI c-spine - Precepted with attending   Hyman Bible, MD PGY-3

## 2017-05-23 NOTE — Assessment & Plan Note (Signed)
Likely musculoskeletal, given that patient describes this as an "achy" pain that improves with heat. Think cardiac etiology is less likely, as pain is not worse with exertion and patient denies any associated chest pain or shortness of breath. Pulmonary etiology considered because patient did have some cough productive of sputum, but it resolved on its own and lungs are clear today. PE unlikely with wells score of 0, no shortness of breath, no tachycardia, and no lower extremity edema. Radicular pain is also a consideration, especially given patient's associated numbness of the left posterior arm. Recent x-ray c-spine 07/2016 with degenerative changes of the mid and lower c-spine (patient was having right arm and shoulder pain when that x-ray was ordered). Also considered prodrome to shingles, given that patient has associated numbness of the posterior/medial arm and does have one erythematous papule present in that area. - Will order CXR to rule out pulmonary or bony etiology - Trial of Gabapentin 300mg  qhs - Return precautions discussed, including advising patient to return to clinic if painful rash develops - If no improvement over the next couple of weeks, could consider MRI c-spine - Precepted with attending

## 2017-06-10 ENCOUNTER — Other Ambulatory Visit: Payer: Self-pay | Admitting: Internal Medicine

## 2017-06-17 ENCOUNTER — Other Ambulatory Visit: Payer: Self-pay | Admitting: Internal Medicine

## 2017-07-22 ENCOUNTER — Other Ambulatory Visit: Payer: Self-pay | Admitting: Internal Medicine

## 2017-09-20 ENCOUNTER — Telehealth: Payer: Self-pay | Admitting: *Deleted

## 2017-09-20 NOTE — Telephone Encounter (Signed)
Pt states that she is out of losartan but she does not want to continue, because it is not working.  She would like to go back on lisinopril.  Will forward to MD.  Pt made appt to meet Dr. Pilar Plate in September. Fleeger, Salome Spotted, CMA

## 2017-09-23 MED ORDER — LISINOPRIL 20 MG PO TABS: 20.0000 mg | ORAL_TABLET | Freq: Every day | ORAL | 0 refills | Status: DC

## 2017-09-23 NOTE — Telephone Encounter (Signed)
Please call pt to inform her that I have put in a one month supply of lisinopril and I would like her to be seen in the next month to address her blood pressure medication.   Thank you, Matilde Haymaker, MD

## 2017-09-23 NOTE — Telephone Encounter (Signed)
Tried to call patient but there was no answer and voicemail was full.  Connie Wade,CMA

## 2017-09-28 ENCOUNTER — Telehealth: Payer: Self-pay

## 2017-09-28 NOTE — Telephone Encounter (Signed)
Patient left message that gabapentin is just not helping her sciatic pain and would like a prescription for something different. Has appt scheduled for 10/19/17.  Call back is [phone]  Danley Danker, RN [facility] [place])

## 2017-10-01 ENCOUNTER — Ambulatory Visit (HOSPITAL_COMMUNITY)
Admission: EM | Admit: 2017-10-01 | Discharge: 2017-10-01 | Disposition: A | Discharge status: Home / Self Care | Payer: BLUE CROSS/BLUE SHIELD | Attending: Family Medicine | Admitting: Family Medicine

## 2017-10-01 ENCOUNTER — Encounter (HOSPITAL_COMMUNITY): Payer: Self-pay

## 2017-10-01 DIAGNOSIS — M79604 Pain in right leg: Secondary | ICD-10-CM | POA: Diagnosis not present

## 2017-10-01 DIAGNOSIS — M5441 Lumbago with sciatica, right side: Secondary | ICD-10-CM

## 2017-10-01 DIAGNOSIS — M4726 Other spondylosis with radiculopathy, lumbar region: Secondary | ICD-10-CM

## 2017-10-01 MED ORDER — CYCLOBENZAPRINE HCL 5 MG PO TABS: 5.0000 mg | ORAL_TABLET | Freq: Three times a day (TID) | ORAL | 0 refills | Status: DC | PRN

## 2017-10-01 MED ORDER — KETOROLAC TROMETHAMINE 60 MG/2ML IM SOLN
INTRAMUSCULAR | Status: AC
  Filled 2017-10-01: qty 2

## 2017-10-01 MED ORDER — METHYLPREDNISOLONE 4 MG PO TBPK: ORAL_TABLET | ORAL | 0 refills | Status: DC

## 2017-10-01 MED ORDER — IBUPROFEN 800 MG PO TABS: 800.0000 mg | ORAL_TABLET | Freq: Three times a day (TID) | ORAL | 0 refills | Status: DC

## 2017-10-01 MED ORDER — KETOROLAC TROMETHAMINE 60 MG/2ML IM SOLN
60.0000 mg | Freq: Once | INTRAMUSCULAR | Status: AC
  Administered 2017-10-01: 60 mg via INTRAMUSCULAR

## 2017-10-01 NOTE — ED Provider Notes (Signed)
[place]    CSN: [id number] Arrival date & time: 10/01/17  0854     History   Chief Complaint Chief Complaint  Patient presents with  . Back Pain  . Leg Pain    HPI Connie Wade is a 62 y.o. female.   HPI  Patient has known lumbar degenerative disc disease.  She has occasional back pain.  Currently she has back pain for 2 weeks.  Is getting worse over time.  She has pain and numbness going down the right leg.  She is tried over-the-counter medicines.  This has not helped.  She is been trying to continue to work.  She works in Scientist, research (medical).  No fall or injury.  No bowel or bladder complaints.  No muscular injury.  She states she does do a lot of lifting.  Past Medical History:  Diagnosis Date  . ANEMIA, OTHER, UNSPECIFIED 03/25/2006  . DEPRESSION 03/17/2007  . HEPATITIS B 03/25/2006  . HIV 03/25/2006   since 2000  . HYDRADENITIS 03/25/2006  . HYPERLIPIDEMIA 03/25/2006  . HYPERTENSION, BENIGN ESSENTIAL 04/26/2007  . TOBACCO DEPENDENCE 03/25/2006  . Tubular adenoma of colon 06/23/2011   Benign, no high grade dysplasia 06/16/11 Followed by [name] Dr. Watt Climes Repeat in 5 years (due 2018)  . UTERINE FIBROID 03/25/2006    Patient Active Problem List   Diagnosis Date Noted  . Shoulder blade pain 05/23/2017  . Gout 04/27/2016  . Subarachnoid hemorrhage due to ruptured aneurysm ([place]) 12/04/2014  . Tobacco abuse 09/19/2012  . Tubular adenoma of colon 06/23/2011  . HYPERTENSION, BENIGN ESSENTIAL 04/26/2007  . Depression 03/17/2007  . Degenerative disc disease, lumbar 03/17/2007  . Human immunodeficiency virus (HIV) disease ([place]) 03/25/2006  . HEPATITIS B 03/25/2006  . UTERINE FIBROID 03/25/2006  . HLD (hyperlipidemia) 03/25/2006  . ANEMIA, OTHER, UNSPECIFIED 03/25/2006  . Allergic rhinitis 03/25/2006  . MENOPAUSAL SYNDROME 03/25/2006    Past Surgical History:  Procedure Laterality Date  . coil     anuerysm  . IR GENERIC HISTORICAL  07/26/2015   IR ANGIO INTRA  EXTRACRAN SEL INTERNAL CAROTID BILAT MOD SED 07/26/2015 Consuella Lose, MD [facility]  . IR GENERIC HISTORICAL  07/26/2015   IR ANGIO VERTEBRAL SEL VERTEBRAL BILAT MOD SED 07/26/2015 Consuella Lose, MD [facility]  . OVARIAN CYST REMOVAL  2001  . RADIOLOGY WITH ANESTHESIA N/A 12/05/2014   Procedure: RADIOLOGY WITH ANESTHESIA-COING FOR SUBARACHNOID HEMORRHAGE;  Surgeon: Consuella Lose, MD;  Location: [facility];  Service: Radiology;  Laterality: N/A;    OB History   None      Home Medications    Prior to Admission medications   Medication Sig Start Date End Date Taking? Authorizing Provider  acetaminophen (TYLENOL) 500 MG tablet Take 1,000 mg by mouth every 6 (six) hours as needed for moderate pain or headache.   Yes [provider]  fluticasone (FLONASE) 50 MCG/ACT nasal spray SPRAY 2 SPRAYS INTO EACH NOSTRIL EVERY DAY 06/17/17  Yes Mayo, Pete Pelt, MD  gabapentin (NEURONTIN) 300 MG capsule TAKE 1 CAPSULE BY MOUTH EVERYDAY AT BEDTIME 06/11/17  Yes Mayo, Pete Pelt, MD  lisinopril (PRINIVIL,ZESTRIL) 20 MG tablet Take 1 tablet (20 mg total) by mouth daily. 09/23/17  Yes Matilde Haymaker, MD  cyclobenzaprine (FLEXERIL) 5 MG tablet Take 1 tablet (5 mg total) by mouth 3 (three) times daily as needed for muscle spasms. 10/01/17   Raylene Everts, MD  ibuprofen (ADVIL,MOTRIN) 800 MG tablet Take 1 tablet (800 mg total) by mouth 3 (three)  times daily. 10/01/17   Raylene Everts, MD  methylPREDNISolone (MEDROL DOSEPAK) 4 MG TBPK tablet tad 10/01/17   Raylene Everts, MD    Family History Family History  Problem Relation Age of Onset  . COPD Sister   . Arthritis Mother   . Alcohol abuse Father     Social History Social History   Tobacco Use  . Smoking status: Former Smoker    Packs/day: 1.00    Types: Cigarettes    Last attempt to quit: 12/17/2014    Years since quitting: 2.7  . Smokeless tobacco: Never Used  Substance Use Topics  . Alcohol use: No    Alcohol/week: 1.0  standard drinks    Types: 1 Glasses of wine per week    Comment: EVERYDAY, three beers today  . Drug use: Yes    Types: Marijuana     Allergies   Shrimp [shellfish allergy]   Review of Systems Review of Systems  Constitutional: Negative for chills and fever.  HENT: Negative for ear pain and sore throat.   Eyes: Negative for pain and visual disturbance.  Respiratory: Negative for cough and shortness of breath.   Cardiovascular: Negative for chest pain and palpitations.  Gastrointestinal: Negative for abdominal pain and vomiting.  Genitourinary: Negative for dysuria and hematuria.  Musculoskeletal: Positive for back pain. Negative for arthralgias.  Skin: Negative for color change and rash.  Neurological: Positive for numbness. Negative for seizures, syncope and weakness.  All other systems reviewed and are negative.    Physical Exam Triage Vital Signs ED Triage Vitals  Enc Vitals Group     BP 10/01/17 0928 (!) 155/93     Pulse Rate 10/01/17 0928 86     Resp 10/01/17 0928 18     Temp 10/01/17 0928 98 F (36.7 C)     Temp src --      SpO2 10/01/17 0928 99 %     Weight --      Height --      Head Circumference --      Peak Flow --      Pain Score 10/01/17 0929 10     Pain Loc --      Pain Edu? --      Excl. in [place]? --    No data found.  Updated Vital Signs BP (!) 155/93   Pulse 86   Temp 98 F (36.7 C)   Resp 18   SpO2 99%          Physical Exam  Constitutional: She appears well-developed and well-nourished. No distress.  Appears uncomfortable  HENT:  Head: Normocephalic and atraumatic.  Mouth/Throat: Oropharynx is clear and moist.  Eyes: Pupils are equal, round, and reactive to light. Conjunctivae are normal.  Neck: Normal range of motion.  Cardiovascular: Normal rate.  Pulmonary/Chest: Effort normal. No respiratory distress.  Abdominal: Soft. She exhibits no distension.  Musculoskeletal: Normal range of motion. She exhibits no edema.  Patient  wheelchair.  Very limited range of motion of lumbar spine.  Tenderness centrally over the L4-5 region.  No muscular spasm.  Reflexes are intact at the knee and ankle.  Straight leg raise is negative bilaterally.   Neurological: She is alert.  Skin: Skin is warm and dry.  Psychiatric: She has a normal mood and affect. Her behavior is normal.     UC Treatments / Results  Labs (all labs ordered are listed, but only abnormal results are displayed) Labs Reviewed - No data to  display  EKG None  Radiology No results found.  Procedures Procedures (including critical care time)  Medications Ordered in UC Medications  ketorolac (TORADOL) injection 60 mg (60 mg Intramuscular Given 10/01/17 1001)    Initial Impression / Assessment and Plan / UC Course  I have reviewed the triage vital signs and the nursing notes.  Pertinent labs & imaging results that were available during my care of the patient were reviewed by me and considered in my medical decision making (see chart for details).     I told her that the x-rays that were done the patient some years ago.  She has known lumbar degenerative disc disease with disc narrowing at the L4-5 junction.  Bone spurs.  I explained to her that with known degenerative disc disease flares periodically are common.  New x-rays are not indicated with no known trauma.  I explained to her that conservative treatment with steroids, pain medicine, muscle relaxers would be helpful.  She should follow-up with her primary care doctor if not better by next week.  She is given a note to be off work for couple of days.  Activity limits are discussed.  Her return are discussed. Final Clinical Impressions(s) / UC Diagnoses   Final diagnoses:  Right leg pain  Osteoarthritis of spine with radiculopathy, lumbar region  Acute midline low back pain with right-sided sciatica     Discharge Instructions     Take the Medrol Dosepak as instructed Take all of day 1  today This is a anti-inflammatory medicine to take down the back and nerve pain Take cyclobenzaprine (Flexeril) as needed.  This is a muscle relaxer After the Medrol Dosepak is finished, you may take ibuprofen 3 times a day for pain.  Take with food Follow-up with your family practice clinic   ED Prescriptions    Medication Sig Dispense Auth. Provider   methylPREDNISolone (MEDROL DOSEPAK) 4 MG TBPK tablet tad 21 tablet Raylene Everts, MD   cyclobenzaprine (FLEXERIL) 5 MG tablet Take 1 tablet (5 mg total) by mouth 3 (three) times daily as needed for muscle spasms. 30 tablet Raylene Everts, MD   ibuprofen (ADVIL,MOTRIN) 800 MG tablet Take 1 tablet (800 mg total) by mouth 3 (three) times daily. 21 tablet Raylene Everts, MD     Controlled Substance Prescriptions [place] Controlled Substance Registry consulted? Not Applicable   Raylene Everts, MD 10/01/17 2210

## 2017-10-01 NOTE — Discharge Instructions (Signed)
Take the Medrol Dosepak as instructed Take all of day 1 today This is a anti-inflammatory medicine to take down the back and nerve pain Take cyclobenzaprine (Flexeril) as needed.  This is a muscle relaxer After the Medrol Dosepak is finished, you may take ibuprofen 3 times a day for pain.  Take with food Follow-up with your family practice clinic

## 2017-10-01 NOTE — ED Triage Notes (Signed)
Pt presents with complaints of back pain radiating to right leg x 2 weeks. Complaints of numbness in right lower leg.

## 2017-10-01 NOTE — Telephone Encounter (Signed)
Contacted patient to see if she was ok with waiting until 9/24, or needed a sooner apt. Pt stated she went to urgent care this am and they gave her steroids and flexeril. Pt stated she will try this regime and see her pcp on 9/24 as scheduled.

## 2017-10-01 NOTE — Telephone Encounter (Signed)
I would like to see Ms. Connie Wade in clinic before recommending anything else.  It looks like she hasn't been seen for a complaint of sciatic in the past year which makes me want to get more information about what's bothering her. I'll follow up with her in clinic on 9/24.   Matilde Haymaker, MD

## 2017-10-19 ENCOUNTER — Ambulatory Visit (INDEPENDENT_AMBULATORY_CARE_PROVIDER_SITE_OTHER): Payer: BLUE CROSS/BLUE SHIELD | Admitting: Family Medicine

## 2017-10-19 ENCOUNTER — Encounter: Payer: Self-pay | Admitting: Family Medicine

## 2017-10-19 VITALS — BP 118/80 | HR 77 | Temp 98.8°F | Wt 115.4 lb

## 2017-10-19 DIAGNOSIS — M25561 Pain in right knee: Secondary | ICD-10-CM

## 2017-10-19 DIAGNOSIS — M5136 Other intervertebral disc degeneration, lumbar region: Secondary | ICD-10-CM

## 2017-10-19 DIAGNOSIS — Z23 Encounter for immunization: Secondary | ICD-10-CM

## 2017-10-19 DIAGNOSIS — I10 Essential (primary) hypertension: Secondary | ICD-10-CM

## 2017-10-19 DIAGNOSIS — B20 Human immunodeficiency virus [HIV] disease: Secondary | ICD-10-CM

## 2017-10-19 MED ORDER — GABAPENTIN 300 MG PO CAPS: 300.0000 mg | ORAL_CAPSULE | Freq: Two times a day (BID) | ORAL | 1 refills | Status: DC

## 2017-10-19 MED ORDER — BACLOFEN 10 MG PO TABS: 5.0000 mg | ORAL_TABLET | Freq: Three times a day (TID) | ORAL | 1 refills | Status: DC

## 2017-10-19 NOTE — Assessment & Plan Note (Signed)
Difficult to be certain if this is acute or chronic. Pt may have injured her knee during her recent fall.  Pt was told to monitor her knee pain for now and told to return for a visit for knee pain if it persists or worsens.  If pt returns to address this pain, a knee x-ray would be a helpful first step.

## 2017-10-19 NOTE — Assessment & Plan Note (Signed)
Continue to follow-up with infectious disease every 6 to 12 months.

## 2017-10-19 NOTE — Assessment & Plan Note (Signed)
This appears to be an exacerbation of DJD.   -Stop taking Flexeril due to abuse potential -Continue to take Tylenol. -Increase gabapentin from 300 mg once a day to 300 mg twice a day -Begin taking baclofen 5 mg 3 times daily

## 2017-10-19 NOTE — Assessment & Plan Note (Signed)
Will continue to take lisinopril 20 mg.

## 2017-10-19 NOTE — Patient Instructions (Addendum)
What You Need to Know About Osteoarthritis Osteoarthritis is a type of arthritis that affects tissue that covers the ends of bones in joints (cartilage). Cartilage acts as a cushion between the bones and helps them move smoothly. Osteoarthritis results when cartilage in the joints gets worn down. Osteoarthritis is sometimes called "wear and tear" arthritis. Osteoarthritis can affect any joint and can make movement painful. Hips, knees, fingers, and toes are some of the joints that are most often affected by osteoarthritis. You may be more likely to develop osteoarthritis if:  You are middle-aged or older.  You are obese.  You have injured a joint or had surgery on a joint.  You have a family history of osteoarthritis.  How can osteoarthritis affect me? Osteoarthritis can cause:  Pain and swelling in your joint.  Difficulty moving your joint.  A grating or scraping feeling inside the joint when you move it.  Popping or creaking sounds when you move.  This condition can make it harder to do things that you need or want to do each day. Osteoarthritis in a major joint, such as your knee or hip, can make it painful to walk or exercise. If you have osteoarthritis in your hands, you might not be able to grip items, twist your hand, or control small movements of your hands and fingers (fine motor skills). Over time, osteoarthritis could cause you to be less physically active. Being less active increases your risk for other long-term (chronic) health problems, such as diabetes and heart disease. What lifestyle changes can be made? You can lessen the impact that osteoarthritis has on your daily life by:  Switching to low-impact activities that do not put repeated pressure on your joints. For example, if you usually run or jog for exercise, try swimming or riding a bike instead.  Staying active. Build up to at least 150 minutes of physical activity each week to keep your joints healthy and keep  your body strong.  Losing weight. If you are overweight or obese, losing weight can take pressure off of your joints. If you need help with weight loss, talk with your health care provider or a diet and nutrition specialist (dietitian).  What other changes can be made? You can also lessen the effect of osteoarthritis by:  Using assistive devices. Sometimes a brace, wrap, splint, specialized glove, or cane can help. Talk with your health care provider or physical therapist about when and how to use these.  Working with a physical therapist who can help you find ways to do your daily activities without harming your joints. A physical therapist can also teach you exercises and stretches to strengthen the muscles that support your joints.  Treating pain and inflammation. Take over-the-counter and prescription medicines for pain and inflammation only as told by your health care provider. If directed, you may put ice on the affected joint: ? If you have a removable assistive device, remove it as told by your health care provider. ? Put ice in a plastic bag. ? Place a towel between your skin and the bag. ? Leave the ice on for 20 minutes, 2-3 times a day.  If other measures do not work, you may need joint surgery, such as joint replacement. What can happen if changes are not made? Osteoarthritis is a condition that gets worse over time (a progressive condition). If you do not take steps to strengthen your body and to slow down the progress of the disease, your condition may get worse more  quickly. Your joints may stiffen and become swollen, which will make them painful and hard to move. Where to find more information: You can learn more about osteoarthritis from:  The [place]: www.RadioScam.is  [employer] of Arthritis and Musculoskeletal and Skin Diseases: www.niams.CityPerson.tn  Contact a  health care provider if:  You cannot do your normal activities comfortably.  Your joint does not function at all.  Your pain is interfering with your sleep.  You are gaining weight.  Your joint appears to be changing in shape, instead of just being swollen and sore. Summary  Osteoarthritis is a painful joint disease that gets worse over time.  This condition can lead to other long-term (chronic) health problems.  There are changes that you can make to slow down the progression of the disease. This information is not intended to replace advice given to you by your health care provider. Make sure you discuss any questions you have with your health care provider. Document Released: 09/03/2015 Document Revised: 09/05/2015 Document Reviewed: 09/03/2015 Elsevier Interactive Patient Education  2018 [employer].

## 2017-10-19 NOTE — Progress Notes (Signed)
Subjective:  Connie Wade is a 62 y.o. female who presents to the [facility] today with a chief complaint of blood pressure management.   HPI: Blood pressure: Her blood pressure today 118/80.  Her previous visits she had been in the 140s and 150s.  She noted that she would like to decrease her medication because her friends are on lower doses of this medicine.  Back/leg pain: Her back pain today seems to be an exacerbation of her typical back pain due to osteoarthritis.  She describes her pain is starting in her back with radiating numbness down her right leg laterally she moves to the medial compartment of her right shin.  She notes that she has recently experienced a fall but she does not think that it is related to this numbness or back pain.  She is currently taking Tylenol (up to 3 g a day), gabapentin 300 mg nightly and Flexeril 5 mg 3 times daily.  She is wary of the Flexeril and does not take it as frequently as indicated, makes her nervous due to her past history of substance use.  She would like to consider different medications for pain control.  Knee pain: She is noting right knee pain today in clinic although she is not sure if this is acute or chronic.  She notes that she did have a fall several days ago while intoxicated (EtOH).  She does have a scrape on her left shin from this fall and is not sure if she hit her right knee.  She notes that she has had one fall in the past month and is unsure if it is related to ongoing knee pain.  She denies any locking sensation in her knee.  HIV: Though Connie Wade has a history of HIV she is currently not receiving any ARV therapy.  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  She explained that she follows with infectious disease every 6 months to year to ensure that her viral load remains undetectable and that her T cells remain in a healthy  range.   Objective:  Physical Exam: BP 118/80   Pulse 77   Temp 98.8 F (37.1 C)   Wt 52.3 kg   SpO2 99%   BMI 21.98 kg/m   Gen: NAD, resting comfortably MSK: no edema, cyanosis, or clubbing noted, right knee tender to palpation, difficult to assess anterior drawer sign due to tenderness, pt able to walk comfortably around the exam room and step up to the exam table with minimal discomfort. Skin: warm, dry Neuro: grossly normal, moves all extremities, sensation to light touch intact and equal in legs bilaterally.   No results found for this or any previous visit (from the past 72 hour(s)).   Assessment/Plan:  HYPERTENSION, BENIGN ESSENTIAL Will continue to take lisinopril 20 mg.    Human immunodeficiency virus (HIV) disease Continue to follow-up with infectious disease every 6 to 12 months.  Degenerative disc disease, lumbar This appears to be an exacerbation of DJD.   -Stop taking Flexeril due to abuse potential -Continue to take Tylenol. -Increase gabapentin from 300 mg once a day to 300 mg twice a day -Begin taking baclofen 5 mg 3 times daily  Acute pain of right knee Difficult to be certain if this is acute or chronic. Pt may have injured her knee during her recent fall.  Pt was told to monitor her knee pain for  now and told to return for a visit for knee pain if it persists or worsens.  If pt returns to address this pain, a knee x-ray would be a helpful first step.

## 2017-10-23 ENCOUNTER — Other Ambulatory Visit: Payer: Self-pay | Admitting: Family Medicine

## 2017-11-21 ENCOUNTER — Other Ambulatory Visit: Payer: Self-pay | Admitting: Family Medicine

## 2017-11-26 ENCOUNTER — Ambulatory Visit: Payer: BLUE CROSS/BLUE SHIELD | Admitting: Family Medicine

## 2017-11-26 ENCOUNTER — Encounter: Payer: Self-pay | Admitting: Family Medicine

## 2017-11-26 ENCOUNTER — Other Ambulatory Visit (HOSPITAL_COMMUNITY)
Admission: RE | Admit: 2017-11-26 | Discharge: 2017-11-26 | Disposition: A | Discharge status: Home / Self Care | Payer: BLUE CROSS/BLUE SHIELD | Source: Ambulatory Visit | Attending: Family Medicine | Admitting: Family Medicine

## 2017-11-26 ENCOUNTER — Other Ambulatory Visit: Payer: Self-pay

## 2017-11-26 VITALS — BP 120/78 | HR 79 | Temp 98.5°F | Ht 60.75 in | Wt 115.0 lb

## 2017-11-26 DIAGNOSIS — Z124 Encounter for screening for malignant neoplasm of cervix: Secondary | ICD-10-CM | POA: Insufficient documentation

## 2017-11-26 NOTE — Progress Notes (Addendum)
    Subjective:  Connie Wade is a 62 y.o. female who presents to the [facility] today to get her last pap smear.   HPI: No new concerns at this time. Pt noted that her back and knee pain is better compared to last visit.  She has not been taking her gabapentin or baclofen because she hasn't needed it.   Regarding her other health maintenance, she reports that she has had a colonoscopy more recently than 2013 and does not need a repeat colonoscopy yet.    Objective:  Physical Exam: BP 120/78   Pulse 79   Temp 98.5 F (36.9 C) (Oral)   Ht 5' 0.75" (1.543 m)   Wt 115 lb (52.2 kg)   SpO2 98%   BMI 21.91 kg/m    Female genitalia: Vulva: normal appearing vulva with no masses, tenderness or lesions Vagina: normal appearing vagina with normal color and discharge, no lesions Cervix: normal appearing cervix without discharge or lesions  Assessment/Plan:  No problem-specific Assessment & Plan notes found for this encounter. Health Maintenance: Pap smear performed without complication.  Pt informed that she will be contacted with results. Pt reports that she has had a colonoscopy more recently than 2013. I will try to get those results form [facility].  Per brief call with medical records at [facility]: last colonoscopy was 10/2016 and she is due next on 10/2021. Medical records were faxed.

## 2017-11-30 ENCOUNTER — Encounter: Payer: Self-pay | Admitting: Family Medicine

## 2017-11-30 ENCOUNTER — Telehealth: Payer: Self-pay | Admitting: *Deleted

## 2017-11-30 LAB — CYTOLOGY - PAP
Diagnosis: NEGATIVE
HPV (WINDOPATH): NOT DETECTED

## 2017-11-30 NOTE — Telephone Encounter (Signed)
[facility]  ID# [id number] BIN# [id number]  PCN# [id number]

## 2017-12-08 ENCOUNTER — Telehealth: Payer: Self-pay | Admitting: Behavioral Health

## 2017-12-08 ENCOUNTER — Telehealth: Payer: Self-pay

## 2017-12-08 NOTE — Telephone Encounter (Signed)
Patient is on overdue pap list. Attempted to call patient to see when last pap smear was done. Left voicemail for patient to call office back. Patient also needs to schedule an appointment for lab work and office visit. [place]

## 2017-12-08 NOTE — Telephone Encounter (Signed)
Patient called back, scheduled her a lab visit and office visit with Dr. Tommy Medal.  Patient states she has already had a PAP smear at family medicine on 11/26/2017 and results are in epic. Pricilla Riffle RN

## 2017-12-08 NOTE — Telephone Encounter (Signed)
FYI  Patient called back, scheduled her a lab visit and office visit with Dr. Tommy Medal.  Patient states she has already had a PAP smear at family medicine on 11/26/2017 and results are in epic. Pricilla Riffle RN

## 2017-12-13 ENCOUNTER — Other Ambulatory Visit: Payer: Self-pay | Admitting: *Deleted

## 2017-12-13 ENCOUNTER — Other Ambulatory Visit: Payer: BLUE CROSS/BLUE SHIELD

## 2017-12-13 DIAGNOSIS — Z113 Encounter for screening for infections with a predominantly sexual mode of transmission: Secondary | ICD-10-CM

## 2017-12-13 DIAGNOSIS — B20 Human immunodeficiency virus [HIV] disease: Secondary | ICD-10-CM

## 2017-12-13 DIAGNOSIS — Z79899 Other long term (current) drug therapy: Secondary | ICD-10-CM

## 2017-12-13 DIAGNOSIS — I10 Essential (primary) hypertension: Secondary | ICD-10-CM

## 2017-12-21 ENCOUNTER — Encounter: Payer: Self-pay | Admitting: Family Medicine

## 2017-12-27 ENCOUNTER — Other Ambulatory Visit: Payer: Self-pay | Admitting: *Deleted

## 2017-12-27 MED ORDER — FLUTICASONE PROPIONATE 50 MCG/ACT NA SUSP: NASAL | 5 refills | Status: DC

## 2017-12-28 ENCOUNTER — Ambulatory Visit: Payer: BLUE CROSS/BLUE SHIELD | Admitting: Infectious Disease

## 2018-03-14 ENCOUNTER — Other Ambulatory Visit: Payer: Self-pay | Admitting: Family Medicine

## 2018-07-07 ENCOUNTER — Telehealth (INDEPENDENT_AMBULATORY_CARE_PROVIDER_SITE_OTHER): Payer: Medicare Other | Admitting: Family Medicine

## 2018-07-07 ENCOUNTER — Other Ambulatory Visit: Payer: Self-pay

## 2018-07-07 DIAGNOSIS — R112 Nausea with vomiting, unspecified: Secondary | ICD-10-CM

## 2018-07-07 MED ORDER — ONDANSETRON 4 MG PO TBDP: 4.0000 mg | ORAL_TABLET | Freq: Three times a day (TID) | ORAL | 0 refills | Status: DC | PRN

## 2018-07-07 NOTE — Progress Notes (Signed)
[facility] Telemedicine Visit  Patient consented to have virtual visit. Method of visit: Telephone  Encounter participants: Patient: Connie Wade - located at home Provider: Bufford Lope - located at [facility] Others (if applicable): none  Chief Complaint: nausea and vomiting  HPI: Last night she had sudden onset nausea and vomiting and diarrhea.  Nonbilious and nonbloody emesis.  No blood in her stool.  Has been drinking water but has not been able to keep down food. She has had no confirmed sick contacts.  She lives with her sister who does not have any symptoms.  They did both go to [employer] yesterday. She has no fever, rash, decreased urine output. She states her belly is sore from dry heaving nonstop.  ROS: per HPI  Pertinent PMHx: Not applicable  Exam:  General: Periodically retching audibly Respiratory: Normal work of breathing, speaks in full sentences  Assessment/Plan: 1. Nausea and vomiting, intractability of vomiting not specified, unspecified vomiting type Likely a viral gastroenteritis.  She has no red flag symptoms.  Discussed supportive care at home with good p.o. hydration, Zofran sent to patient pharmacy to help with this.  Discussed red flags including fevers, worsening, rash, decreased urine output.  Patient voiced good understanding. - ondansetron (ZOFRAN ODT) 4 MG disintegrating tablet; Take 1 tablet (4 mg total) by mouth every 8 (eight) hours as needed for nausea or vomiting.  Dispense: 20 tablet; Refill: 0   Time spent during visit with patient: 6 minutes  Bufford Lope, DO PGY-3, [facility] 07/07/2018 1:52 PM

## 2018-07-09 ENCOUNTER — Other Ambulatory Visit: Payer: Self-pay | Admitting: Family Medicine

## 2018-07-12 ENCOUNTER — Telehealth: Payer: Self-pay | Admitting: *Deleted

## 2018-07-12 NOTE — Telephone Encounter (Signed)
I agree with the recommendation below.  Please let me know if she has additional concerns.  Matilde Haymaker, MD

## 2018-07-12 NOTE — Telephone Encounter (Signed)
Pt calling to check status.  She is out of medication. Christen Bame, CMA

## 2018-07-12 NOTE — Telephone Encounter (Signed)
Pt has a stiff neck and wants to know what to do.   She is unable to take ibuprofen due to an aneurysm.  She will try a heating pad and call back tomorrow if no relief. Christen Bame, CMA

## 2018-07-13 ENCOUNTER — Encounter: Payer: Self-pay | Admitting: Family Medicine

## 2018-07-13 ENCOUNTER — Ambulatory Visit (INDEPENDENT_AMBULATORY_CARE_PROVIDER_SITE_OTHER): Payer: Medicare Other | Admitting: Family Medicine

## 2018-07-13 ENCOUNTER — Other Ambulatory Visit: Payer: Self-pay

## 2018-07-13 DIAGNOSIS — M542 Cervicalgia: Secondary | ICD-10-CM | POA: Diagnosis not present

## 2018-07-13 HISTORY — DX: Cervicalgia: M54.2

## 2018-07-13 NOTE — Patient Instructions (Signed)
Thank you for coming in to see [place] today! Please see below to review our plan for today's visit:  - Try some stretching exercises to your neck in 3 different directions 2 times daily.  - Take Tylenol 1,000mg  every 6 hours (4 times daily) - do not consume with alcohol.  - Take Gabapentin 300mg  twice daily, once in the morning and again at night, for the next 203 days. - Use heating pad on your shoulders/neck 4-5 times daily for no more than 15 minutes each time! - Drink plenty of water (6 glasses daily).   Please call the clinic at [phone] if your symptoms worsen or you have any concerns. It was our pleasure to serve you!  Dr. Milus Banister [facility]

## 2018-07-13 NOTE — Assessment & Plan Note (Signed)
-   Try some stretching exercises to your neck in 3 different directions 2 times daily.  - Take Tylenol 1,000mg  every 6 hours (4 times daily) - do not consume with alcohol.  - Take Gabapentin 300mg  twice daily, once in the morning and again at night, for the next 203 days. - Use heating pad on your shoulders/neck 4-5 times daily for no more than 15 minutes each time! - Drink plenty of water (6 glasses daily).

## 2018-07-13 NOTE — Telephone Encounter (Signed)
Pt calls back.  Heating pad not helping, appt made for this afternoon @ 3:45. Christen Bame, CMA

## 2018-07-13 NOTE — Progress Notes (Signed)
   Subjective:    Patient ID: Connie Wade, female    DOB: 1955-12-25, 63 y.o.   MRN: [id number]   CC: neck pain  HPI: Currently recovering from stomach virus, has been staying at home and staying in bed. The achy neck pain started Monday (6/15), she was cleaning a house (for work) and the next day she started feeling like the had a crook in her neck. She has tried [place] but it wasn't helping. She called the clinic and was told to put heat on the spot and she continued to have excruciating pain. She took some Gabapentin 300mg  last night to help her sleep and this was affective. Pain is worse on the right side neck/shoulder, patient is right handed. She has not had this pain before. She denies chest pain, focal weakness, numbness or tingling, pain stays in the "triangle" of her neck and trapezius. Denies headaches or feeling ill.   Smoking status reviewed: she quit smoking  Review of Systems: see HPI    Objective:  BP 128/60   Pulse 90   SpO2 97%  Vitals and nursing note reviewed  General: well nourished, in no acute distress HEENT: normocephalic, TM's visualized bilaterally, no scleral icterus or conjunctival pallor, no nasal discharge, moist mucous membranes, good dentition without erythema or discharge noted in posterior oropharynx Neck: decreased ROM with sidebending/rotation/flexion/extension; tender to palpation in bilateral paravertebral muscles, w/o lymphadenopathy;  Cardiac: RRR, clear S1 and S2, no murmurs, rubs, or gallops Respiratory: clear to auscultation bilaterally, no increased work of breathing Abdomen: soft, nontender, nondistended, no masses or organomegaly. Bowel sounds present Extremities: no edema or cyanosis. Warm, well perfused. 2+ radial and PT pulses bilaterally Skin: warm and dry, no rashes noted Neuro: alert and oriented, no focal deficits, 5/5 strength in extension/flexion and grip strength  Osteopathic assessment: hypertonicity to trapezius R>L and  bilateral rhomboids without gross deformity. Left shoulder trapezius is boggier than right (L has possible overlying lipoma). Patient with restricted motion in neck flexion, extension, R and L side bending, and R and L rotation. Patient was treated with cervical soft tissue technique and muscle energy to decrease tonicity and increase ROM. Desired results were achieved and patient can be sent home to continue neck stretching and ROM exercises.   Assessment & Plan:   Neck pain: improved s/p osteopathic soft tissue and muscle energy technique - Try some stretching exercises to your neck in 3 different directions 2 times daily.  - Take Tylenol 1,000mg  every 6 hours (4 times daily) - do not consume with alcohol.  - Take Gabapentin 300mg  twice daily, once in the morning and again at night, for the next 203 days. - Use heating pad on your shoulders/neck 4-5 times daily for no more than 15 minutes each time! - Drink plenty of water (6 glasses daily).  Return if symptoms worsen or fail to improve.  Dr. Milus [facility], PGY-1

## 2018-10-12 DIAGNOSIS — H524 Presbyopia: Secondary | ICD-10-CM | POA: Diagnosis not present

## 2018-10-12 DIAGNOSIS — H25093 Other age-related incipient cataract, bilateral: Secondary | ICD-10-CM | POA: Diagnosis not present

## 2018-10-31 ENCOUNTER — Other Ambulatory Visit: Payer: Self-pay | Admitting: Family Medicine

## 2018-10-31 MED ORDER — LISINOPRIL 20 MG PO TABS: 20.0000 mg | ORAL_TABLET | Freq: Every day | ORAL | 0 refills | Status: DC

## 2018-10-31 NOTE — Telephone Encounter (Signed)
Pt is calling to have lisinopril refilled. She scheduled a med refill appointment for 11/05 but would like to have enough of her medication sent to her pharmacy to last until this appointment.

## 2018-12-01 ENCOUNTER — Ambulatory Visit (HOSPITAL_COMMUNITY)
Admission: RE | Admit: 2018-12-01 | Discharge: 2018-12-01 | Disposition: A | Discharge status: Home / Self Care | Payer: Medicare Other | Source: Ambulatory Visit | Attending: Family Medicine | Admitting: Family Medicine

## 2018-12-01 ENCOUNTER — Ambulatory Visit (INDEPENDENT_AMBULATORY_CARE_PROVIDER_SITE_OTHER): Payer: Medicare Other | Admitting: Family Medicine

## 2018-12-01 ENCOUNTER — Encounter: Payer: Self-pay | Admitting: Family Medicine

## 2018-12-01 ENCOUNTER — Other Ambulatory Visit: Payer: Self-pay

## 2018-12-01 VITALS — BP 118/62 | HR 92 | Ht 61.0 in | Wt 113.2 lb

## 2018-12-01 DIAGNOSIS — R011 Cardiac murmur, unspecified: Secondary | ICD-10-CM | POA: Insufficient documentation

## 2018-12-01 DIAGNOSIS — Z780 Asymptomatic menopausal state: Secondary | ICD-10-CM

## 2018-12-01 DIAGNOSIS — B20 Human immunodeficiency virus [HIV] disease: Secondary | ICD-10-CM | POA: Diagnosis not present

## 2018-12-01 DIAGNOSIS — Z1231 Encounter for screening mammogram for malignant neoplasm of breast: Secondary | ICD-10-CM | POA: Diagnosis not present

## 2018-12-01 DIAGNOSIS — Z23 Encounter for immunization: Secondary | ICD-10-CM | POA: Diagnosis not present

## 2018-12-01 DIAGNOSIS — I10 Essential (primary) hypertension: Secondary | ICD-10-CM

## 2018-12-01 DIAGNOSIS — Z1239 Encounter for other screening for malignant neoplasm of breast: Secondary | ICD-10-CM | POA: Insufficient documentation

## 2018-12-01 NOTE — Progress Notes (Signed)
    Subjective:  Connie Wade is a 63 y.o. female who presents to the [facility] today with a chief complaint of routine clinic visit.   HPI:  HTN Currently takes lisinopril 20 mg daily.  She has not noted any dizziness or lightheadedness with standing.  She was wondering if this dose could be reduced because her friends take less of this medication.  HIV, elite controller She is not on medications per ID recommendation.  She has not followed up with infectious disease in about 1 year.  She notes that she is supposed to see them roughly once every year.  Screening She reports that she usually gets a mammogram every 2 years.  She denies she is due for a mammogram soon.  She is open to having a bone density scan.  Chief Complaint noted Review of Symptoms - see HPI PMH - Smoking status noted.    Objective:  Physical Exam: BP 118/62   Pulse 92   Ht 5\' 1"  (1.549 m)   Wt 113 lb 4 oz (51.4 kg)   SpO2 98%   BMI 21.40 kg/m    Gen: NAD, resting comfortably CV: RRR.  1/6 systolic murmur noted at the right sternal border. Pulm: NWOB, CTAB with no crackles, wheezes, or rhonchi GI: Normal bowel sounds present. Soft, Nontender, Nondistended. MSK: no edema, cyanosis, or clubbing noted Skin: warm, dry Neuro: grossly normal, moves all extremities Psych: Normal affect and thought content  No results found for this or any previous visit (from the past 72 hour(s)).   Assessment/Plan:  HYPERTENSION, BENIGN ESSENTIAL Well-controlled today.  No evidence of hypotension.  We will continue on current regimen for now. -Continue lisinopril 20 mg daily -Follow-up BMP  Human immunodeficiency virus (HIV) disease Elite controller, no medications recommended by ID. -Follow-up with ID  Postmenopausal estrogen deficiency -DEXA scan ordered  Screening for breast cancer -Call breast center to schedule mammogram  Systolic murmur New, 1/6 systolic murmur loudest at right sternal border.  Denies  symptoms of shortness of breath, orthopnea, lightheadedness/dizziness, chest pain, palpitations.  EKG performed in office shows only left atrial enlargement without evidence of LVH.  Differential includes: Aortic stenosis, aortic sclerosis, flow murmur, tricuspid regurg.  Discussed the possibility of ordering an echocardiogram for further evaluation.  Based on lack of symptoms, Connie Wade decided not to pursue further work-up at this time.  Her risk factors for cardiovascular disease include 20-pack-year smoking history (last cigarette 2016) and hypertension.  She is advised to monitor self for symptoms of dyspnea on exertion, chest pain, palpitations, lightheadedness/dizziness. -Continue to monitor

## 2018-12-01 NOTE — Assessment & Plan Note (Signed)
Elite controller, no medications recommended by ID. -Follow-up with ID

## 2018-12-01 NOTE — Assessment & Plan Note (Signed)
-  Call breast center to schedule mammogram

## 2018-12-01 NOTE — Patient Instructions (Signed)
It was so good to see you today.  Here is a quick summary of what we covered.  - Keep taking your lisinopril - HIV - make sure you are following up with your infectious disease doctor regularly - screening. You have the sheet to contact the [facility] about a mammogram and we have ordered a bone density scan as well.  You should be able to schedule both at the [facility].  I will let you know if your labs have any abnormalities.  See you again in one year!

## 2018-12-01 NOTE — Assessment & Plan Note (Signed)
-   DEXA scan ordered

## 2018-12-01 NOTE — Assessment & Plan Note (Addendum)
Well-controlled today.  No evidence of hypotension.  We will continue on current regimen for now. -Continue lisinopril 20 mg daily -Follow-up BMP

## 2018-12-01 NOTE — Assessment & Plan Note (Signed)
New, 1/6 systolic murmur loudest at right sternal border.  Denies symptoms of shortness of breath, orthopnea, lightheadedness/dizziness, chest pain, palpitations.  EKG performed in office shows only left atrial enlargement without evidence of LVH.  Differential includes: Aortic stenosis, aortic sclerosis, flow murmur, tricuspid regurg.  Discussed the possibility of ordering an echocardiogram for further evaluation.  Based on lack of symptoms, Connie Wade decided not to pursue further work-up at this time.  Her risk factors for cardiovascular disease include 20-pack-year smoking history (last cigarette 2016) and hypertension.  She is advised to monitor self for symptoms of dyspnea on exertion, chest pain, palpitations, lightheadedness/dizziness. -Continue to monitor

## 2018-12-02 ENCOUNTER — Encounter: Payer: Self-pay | Admitting: Family Medicine

## 2018-12-02 ENCOUNTER — Other Ambulatory Visit: Payer: Self-pay | Admitting: Family Medicine

## 2018-12-02 DIAGNOSIS — Z1231 Encounter for screening mammogram for malignant neoplasm of breast: Secondary | ICD-10-CM

## 2018-12-02 LAB — BASIC METABOLIC PANEL
BUN/Creatinine Ratio: 21 (ref 12–28)
BUN: 18 mg/dL (ref 8–27)
CO2: 20 mmol/L (ref 20–29)
Calcium: 9.7 mg/dL (ref 8.7–10.3)
Chloride: 105 mmol/L (ref 96–106)
Creatinine, Ser: 0.87 mg/dL (ref 0.57–1.00)
GFR calc Af Amer: 81 mL/min/{1.73_m2} (ref >59)
GFR calc non Af Amer: 70 mL/min/{1.73_m2} (ref >59)
Glucose: 89 mg/dL (ref 65–99)
Potassium: 4.4 mmol/L (ref 3.5–5.2)
Sodium: 139 mmol/L (ref 134–144)

## 2019-01-25 ENCOUNTER — Other Ambulatory Visit: Payer: Self-pay | Admitting: Family Medicine

## 2019-02-27 ENCOUNTER — Ambulatory Visit
Admission: RE | Admit: 2019-02-27 | Discharge: 2019-02-27 | Disposition: A | Discharge status: Home / Self Care | Payer: Medicare Other | Source: Ambulatory Visit | Attending: Family Medicine | Admitting: Family Medicine

## 2019-02-27 ENCOUNTER — Other Ambulatory Visit: Payer: Self-pay

## 2019-02-27 DIAGNOSIS — Z780 Asymptomatic menopausal state: Secondary | ICD-10-CM

## 2019-02-27 DIAGNOSIS — Z1231 Encounter for screening mammogram for malignant neoplasm of breast: Secondary | ICD-10-CM | POA: Diagnosis not present

## 2019-03-01 ENCOUNTER — Telehealth: Payer: Self-pay

## 2019-03-06 ENCOUNTER — Telehealth: Payer: Self-pay

## 2019-03-06 NOTE — Telephone Encounter (Signed)
Please call Connie Wade and let her know if this is simply her chronic back pain, she can look up exercises online (healthline and [facility] are good places to look). I would also be happy to order a referral to Physical Therapy if she would like that.  If she is having new back pain, please ask her to be seen in clinic.  Thank you, Matilde Haymaker, MD

## 2019-03-06 NOTE — Telephone Encounter (Signed)
Pt calls nurse line regarding worsening back pain. Patient is taking gabapentin 300mg  2 times daily. Patient reports that medication is not helping. Patient requests PT exercises that could help with pain.   To PCP  Talbot Grumbling, RN

## 2019-03-08 NOTE — Telephone Encounter (Addendum)
Spoke with pt, she has tried some exercises that we have given her in the past with no relief.  No complains that pain is going down her leg.  Only wants to see Dr. Pilar Plate, appt made for Friday pm. Christen Bame, CMA

## 2019-03-09 ENCOUNTER — Ambulatory Visit: Payer: Medicare Other | Attending: Family

## 2019-03-09 DIAGNOSIS — Z23 Encounter for immunization: Secondary | ICD-10-CM | POA: Insufficient documentation

## 2019-03-09 NOTE — Progress Notes (Signed)
   Covid-19 Vaccination Clinic  Name:  Connie Wade    MRN: [id number] DOB: 1955/03/23  03/09/2019  Connie Wade was observed post Covid-19 immunization for 15 minutes without incidence. She was provided with Vaccine Information Sheet and instruction to access the V-Safe system.   Connie Wade was instructed to call 911 with any severe reactions post vaccine: Marland Kitchen Difficulty breathing  . Swelling of your face and throat  . A fast heartbeat  . A bad rash all over your body  . Dizziness and weakness    Immunizations Administered    Name Date Dose VIS Date Route   Pfizer COVID-19 Vaccine 03/09/2019  2:45 PM 0.3 mL 01/06/2019 Intramuscular   Manufacturer: [employer], [employer]   Lot: [id number]   [place]: [id number]

## 2019-03-10 ENCOUNTER — Ambulatory Visit (INDEPENDENT_AMBULATORY_CARE_PROVIDER_SITE_OTHER): Payer: Medicare Other | Admitting: Family Medicine

## 2019-03-10 ENCOUNTER — Other Ambulatory Visit: Payer: Self-pay

## 2019-03-10 DIAGNOSIS — M5136 Other intervertebral disc degeneration, lumbar region: Secondary | ICD-10-CM

## 2019-03-10 MED ORDER — CYCLOBENZAPRINE HCL 10 MG PO TABS: 10.0000 mg | ORAL_TABLET | Freq: Two times a day (BID) | ORAL | 0 refills | Status: DC | PRN

## 2019-03-10 NOTE — Progress Notes (Signed)
[place] Telemedicine Visit   Encounter participants: Patient: Connie Wade - located at home Provider: Matilde Haymaker - located at [facility] Others (if applicable): none  Chief Complaint: back pain  HPI:  Degenerative disc disease Ms. Northcott has a history of degenerative disc disease of the lumbar region.  She has previously experienced low back pain.  For the past 2 weeks, she has had worsened low back pain feels similar to her previous experience with the addition of occasional shooting pain.  She noted that she sometimes has shooting pain going down the back of her leg to about her mid thigh.  This persisted for about a week and a half but seems to have made some minor improvements since she removed to the top layer of her foam mattress.  She denies urinary or fecal incontinence and reports that she is currently walking comfortably without her cane.  She would like to know what she can take for this back pain in addition to her gabapentin.  ROS: per HPI  Pertinent PMHx: Aneurysm with subarachnoid hemorrhage.  Degenerative disc disease  Exam:  Respiratory: Breathing comfortably on room air.  No difficulty completing long sentences without effort.  Assessment/Plan:  Degenerative disc disease, lumbar -Ambulatory referral to physical therapy -2 weeks of Flexeril nightly as needed -Tylenol for pain (NSAIDs avoided due to aneurysm)    Time spent during visit with patient: 15 minutes

## 2019-03-10 NOTE — Assessment & Plan Note (Signed)
-  Ambulatory referral to physical therapy -2 weeks of Flexeril nightly as needed -Tylenol for pain (NSAIDs avoided due to aneurysm)

## 2019-04-11 ENCOUNTER — Ambulatory Visit: Payer: Medicare Other | Attending: Family

## 2019-04-11 DIAGNOSIS — Z23 Encounter for immunization: Secondary | ICD-10-CM

## 2019-04-11 NOTE — Progress Notes (Signed)
   Covid-19 Vaccination Clinic  Name:  Connie Wade    MRN: [id number] DOB: 08-Sep-1955  04/11/2019  [name] was observed post Covid-19 immunization for 15 minutes without incident. She was provided with Vaccine Information Sheet and instruction to access the V-Safe system.   [name] was instructed to call 911 with any severe reactions post vaccine: Marland Kitchen Difficulty breathing  . Swelling of face and throat  . A fast heartbeat  . A bad rash all over body  . Dizziness and weakness   Immunizations Administered    Name Date Dose VIS Date Route   Moderna COVID-19 Vaccine 04/11/2019  2:11 PM 0.5 mL 12/27/2018 Intramuscular   Manufacturer: Moderna   Lot: [id number]   [place]:[id number]

## 2019-04-18 NOTE — Telephone Encounter (Signed)
Opened in error

## 2019-04-25 ENCOUNTER — Other Ambulatory Visit: Payer: Self-pay | Admitting: Family Medicine

## 2019-05-08 ENCOUNTER — Other Ambulatory Visit: Payer: Self-pay | Admitting: Family Medicine

## 2019-06-22 ENCOUNTER — Telehealth: Payer: Self-pay

## 2019-06-22 NOTE — Telephone Encounter (Signed)
Patient calls nurse line regarding R arm pain and numbness. Patient describes pain as "aching" pain that comes and goes. Patient reports onset of about 3 weeks ago. Patient rates current pain of 5/10. Denies chest pain, shortness of breath. Patient scheduled tomorrow morning for office visit at 8:45.   ED precautions given.   Talbot Grumbling, RN

## 2019-06-23 ENCOUNTER — Encounter: Payer: Self-pay | Admitting: Family Medicine

## 2019-06-23 ENCOUNTER — Other Ambulatory Visit: Payer: Self-pay

## 2019-06-23 ENCOUNTER — Ambulatory Visit (INDEPENDENT_AMBULATORY_CARE_PROVIDER_SITE_OTHER): Payer: Medicare Other | Admitting: Family Medicine

## 2019-06-23 DIAGNOSIS — M5136 Other intervertebral disc degeneration, lumbar region: Secondary | ICD-10-CM

## 2019-06-23 DIAGNOSIS — G5691 Unspecified mononeuropathy of right upper limb: Secondary | ICD-10-CM | POA: Diagnosis not present

## 2019-06-23 MED ORDER — GABAPENTIN 300 MG PO CAPS: 300.0000 mg | ORAL_CAPSULE | Freq: Two times a day (BID) | ORAL | 1 refills | Status: DC

## 2019-06-23 NOTE — Patient Instructions (Signed)
It was a pleasure to see you today! Thank you for choosing [facility] for your primary care.  Connie Wade was seen for right arm/neck numbness and tingling.   Our plans for today were:  Start taking gabapentin twice daily, you can start taking at night as we discussed and then increase as needed.   Call our office [phone]) if you do not feel improved within the next two weeks.   Please call us if you began to experience worsening symptoms, redness or swelling in your arm. If you are unable to use your arm or move it, please go to the ED as this may be symptoms of a stroke.    You should return to our clinic as needed.   Neuropathic Pain Neuropathic pain is pain caused by damage to the nerves that are responsible for certain sensations in your body (sensory nerves). The pain can be caused by:  Damage to the sensory nerves that send signals to your spinal cord and brain (peripheral nervous system).  Damage to the sensory nerves in your brain or spinal cord (central nervous system). Neuropathic pain can make you more sensitive to pain. Even a minor sensation can feel very painful. This is usually a long-term condition that can be difficult to treat. The type of pain differs from person to person. It may:  Start suddenly (acute), or it may develop slowly and last for a long time (chronic).  Come and go as damaged nerves heal, or it may stay at the same level for years.  Cause emotional distress, loss of sleep, and a lower quality of life. What are the causes? The most common cause of this condition is diabetes. Many other diseases and conditions can also cause neuropathic pain. Causes of neuropathic pain can be classified as:  Toxic. This is caused by medicines and chemicals. The most common cause of toxic neuropathic pain is damage from cancer treatments (chemotherapy).  Metabolic. This can be caused by: ? Diabetes. This is the most common disease that  damages the nerves. ? Lack of vitamin B from long-term alcohol abuse.  Traumatic. Any injury that cuts, crushes, or stretches a nerve can cause damage and pain. A common example is feeling pain after losing an arm or leg (phantom limb pain).  Compression-related. If a sensory nerve gets trapped or compressed for a long period of time, the blood supply to the nerve can be cut off.  Vascular. Many blood vessel diseases can cause neuropathic pain by decreasing blood supply and oxygen to nerves.  Autoimmune. This type of pain results from diseases in which the body's defense system (immune system) mistakenly attacks sensory nerves. Examples of autoimmune diseases that can cause neuropathic pain include lupus and multiple sclerosis.  Infectious. Many types of viral infections can damage sensory nerves and cause pain. Shingles infection is a common cause of this type of pain.  Inherited. Neuropathic pain can be a symptom of many diseases that are passed down through families (genetic). What increases the risk? You are more likely to develop this condition if:  You have diabetes.  You smoke.  You drink too much alcohol.  You are taking certain medicines, including medicines that kill cancer cells (chemotherapy) or that treat immune system disorders. What are the signs or symptoms? The main symptom is pain. Neuropathic pain is often described as:  Burning.  Shock-like.  Stinging.  Hot or cold.  Itching. How is this diagnosed? No single test can diagnose neuropathic pain.  It is diagnosed based on:  Physical exam and your symptoms. Your health care provider will ask you about your pain. You may be asked to use a pain scale to describe how bad your pain is.  Tests. These may be done to see if you have a high sensitivity to pain and to help find the cause and location of any sensory nerve damage. They include: ? Nerve conduction studies to test how well nerve signals travel through your  sensory nerves (electrodiagnostic testing). ? Stimulating your sensory nerves through electrodes on your skin and measuring the response in your spinal cord and brain (somatosensory evoked potential).  Imaging studies, such as: ? X-rays. ? CT scan. ? MRI. How is this treated? Treatment for neuropathic pain may change over time. You may need to try different treatment options or a combination of treatments. Some options include:  Treating the underlying cause of the neuropathy, such as diabetes, kidney disease, or vitamin deficiencies.  Stopping medicines that can cause neuropathy, such as chemotherapy.  Medicine to relieve pain. Medicines may include: ? Prescription or over-the-counter pain medicine. ? Anti-seizure medicine. ? Antidepressant medicines. ? Pain-relieving patches that are applied to painful areas of skin. ? A medicine to numb the area (local anesthetic), which can be injected as a nerve block.  Transcutaneous nerve stimulation. This uses electrical currents to block painful nerve signals. The treatment is painless.  Alternative treatments, such as: ? Acupuncture. ? Meditation. ? Massage. ? Physical therapy. ? Pain management programs. ? Counseling. Follow these instructions at home: Medicines   Take over-the-counter and prescription medicines only as told by your health care provider.  Do not drive or use heavy machinery while taking prescription pain medicine.  If you are taking prescription pain medicine, take actions to prevent or treat constipation. Your health care provider may recommend that you: ? Drink enough fluid to keep your urine pale yellow. ? Eat foods that are high in fiber, such as fresh fruits and vegetables, whole grains, and beans. ? Limit foods that are high in fat and processed sugars, such as fried or sweet foods. ? Take an over-the-counter or prescription medicine for constipation. Lifestyle   Have a good support system at  home.  Consider joining a chronic pain support group.  Do not use any products that contain nicotine or tobacco, such as cigarettes and e-cigarettes. If you need help quitting, ask your health care provider.  Do not drink alcohol. General instructions  Learn as much as you can about your condition.  Work closely with all your health care providers to find the treatment plan that works best for you.  Ask your health care provider what activities are safe for you.  Keep all follow-up visits as told by your health care provider. This is important. Contact a health care provider if:  Your pain treatments are not working.  You are having side effects from your medicines.  You are struggling with tiredness (fatigue), mood changes, depression, or anxiety. Summary  Neuropathic pain is pain caused by damage to the nerves that are responsible for certain sensations in your body (sensory nerves).  Neuropathic pain may come and go as damaged nerves heal, or it may stay at the same level for years.  Neuropathic pain is usually a long-term condition that can be difficult to treat. Consider joining a chronic pain support group. This information is not intended to replace advice given to you by your health care provider. Make sure you discuss any questions  you have with your health care provider. Document Revised: 05/05/2018 Document Reviewed: 01/29/2017 Elsevier Patient Education  2020 [employer].   Best Wishes,  Dr. Rosita Fire

## 2019-06-23 NOTE — Assessment & Plan Note (Signed)
Patient with 2 weeks of numbness and paresthesias and right upper extremity and right side of neck.  Differential includes cervical radiculopathy, peripheral neuropathy and less likely myelopathy. Most likely between cervical radiculopathy and peripheral neuropathy and patient does not report pain as primary symptom.   -gabapentin twice daily, 300mg   -RTC and ED precautions given  -patient to call if symptoms do not improve

## 2019-06-23 NOTE — Progress Notes (Signed)
    SUBJECTIVE:   CHIEF COMPLAINT / HPI: right arm numbness  Right upper extremity numbness/tingling Connie Wade reports that she has been experiencing numbness and paresthesias in her right side of neck that extends into her right upper extremity for the last 2 weeks.  Patient states that she has not tried to take any medications.  She previously had this issue and used an arm brace so she tried to use this brace again but without relief.  Patient states that the numbness and tingling is worst when she is driving.  Patient states that she sometimes has dropped objects due to the numbness.  She reports normal range of motion and ability to move bilateral upper extremities.  Denies any redness or swelling.  Patient denies any recent trauma to her neck or right upper extremity recently.  She has no problems in the left upper extremity.  Denies any skin changes.  Patient states that she previously was prescribed gabapentin and has not taken this in about 6 months but would like to restart this medication.     PERTINENT  PMH / PSH: Arthritis  OBJECTIVE:   BP 126/72   Pulse 85   Ht 5' 0.5" (1.537 m)   Wt 110 lb 9.6 oz (50.2 kg)   SpO2 100%   BMI 21.24 kg/m   General: female appearing stated age in no acute distress, sitting in exam room chair HEENT: Neck non-tender, no swelling or signs of erythema, demonstrates normal range of motion with neck flexion and extension Cardio: Normal S1 and S2, no S3 or S4. Rhythm is regular. No murmurs or rubs.  Bilateral radial pulses palpable Pulm: Clear to auscultation bilaterally, no crackles, wheezing, or diminished breath sounds. Normal respiratory effort, stable on room air Abdomen: Bowel sounds normal. Abdomen soft and non-tender.  Extremities: No peripheral edema. Warm/ well perfused. Neuro: pt alert and oriented x4; sensation on right side of neck and right upper extremity intact throughout, responsive to monofilament testing in all regions of right  upper extremity and 5 fingertips, 5/5 strength, normal grip strength   ASSESSMENT/PLAN:   Neuropathy, arm, right Patient with 2 weeks of numbness and paresthesias and right upper extremity and right side of neck.  Differential includes cervical radiculopathy, peripheral neuropathy and less likely myelopathy. Most likely between cervical radiculopathy and peripheral neuropathy and patient does not report pain as primary symptom.   -gabapentin twice daily, 300mg   -RTC and ED precautions given  -patient to call if symptoms do not improve      Stark Klein, MD [place]

## 2019-07-23 ENCOUNTER — Other Ambulatory Visit: Payer: Self-pay | Admitting: Family Medicine

## 2019-07-28 ENCOUNTER — Other Ambulatory Visit: Payer: Self-pay

## 2019-07-28 ENCOUNTER — Ambulatory Visit (INDEPENDENT_AMBULATORY_CARE_PROVIDER_SITE_OTHER): Payer: Medicare Other | Admitting: Family Medicine

## 2019-07-28 DIAGNOSIS — K529 Noninfective gastroenteritis and colitis, unspecified: Secondary | ICD-10-CM | POA: Diagnosis not present

## 2019-07-28 HISTORY — DX: Noninfective gastroenteritis and colitis, unspecified: K52.9

## 2019-07-28 NOTE — Patient Instructions (Signed)
It was great to meet you!  Our plans for today:  - Begin taking Imodium (over the counter) 1-2 tabs daily as needed - Modify diet over the next few days to include bland foods (bread, rice, bananas, crackers) and avoid greasy meals (french fries, cheeseburgers, excessive dairy)  - Drink lots of water! - Call if symptoms are worsening or not improved after 1 week.  Take care,  Dr. [facility]

## 2019-07-28 NOTE — Progress Notes (Signed)
    SUBJECTIVE:   CHIEF COMPLAINT / HPI:   Patient presents with diarrhea. The diarrhea has occurred daily x7 days, typically 3-4 episodes per day, and is exacerbated by food intake. She noticed a small amount of blood on the toilet tissue yesterday and became concerned. Patient states her rectum feels "chapped" from excessive stool output but denies rectal pain or melena. No significant abdominal pain, nausea, vomiting, fever, chills or urinary symptoms. Denies fatigue or SOB. No sick contacts.   PERTINENT  PMH / PSH: Hepatitis B, HIV, HTN  OBJECTIVE:   BP 121/70   Pulse 93   Ht 5\' 1"  (1.549 m)   Wt 108 lb 3.2 oz (49.1 kg)   SpO2 98%   BMI 20.44 kg/m   General: well-appearing, alert, NAD Eyes: normal sclera and conjunctiva Cardiac: RRR, II/VI systolic murmur heard best at LUSB Lungs: normal work of breathing, breath sounds equal bilaterally, lungs CTA without wheezes or rales Abdomen: active bowel sounds, mild diffuse tenderness, no rebound or guarding, no hepatosplenomegaly Rectal exam deferred   ASSESSMENT/PLAN:   Enteritis Diarrhea most likely due to benign viral enteritis given lack of other symptoms and reassuring physical exam. Small amount of blood likely secondary to local irritation. Low suspicion for C. Diff, invasive GI illness, or GI bleeding at this time.  Plan: -supportive care including Imodium as needed and dietary modifications -encouraged hydration -return precautions given     Alcus Dad, MD [place]

## 2019-07-28 NOTE — Assessment & Plan Note (Addendum)
Diarrhea most likely due to benign viral enteritis given lack of other symptoms and reassuring physical exam. Small amount of blood likely secondary to local irritation. Low suspicion for C. Diff, invasive GI illness, or GI bleeding at this time.  Plan: -supportive care including Imodium as needed and dietary modifications -encouraged hydration -return precautions given

## 2019-10-31 ENCOUNTER — Other Ambulatory Visit: Payer: Self-pay | Admitting: Family Medicine

## 2019-11-03 ENCOUNTER — Other Ambulatory Visit: Payer: Self-pay | Admitting: Family Medicine

## 2019-11-20 ENCOUNTER — Encounter: Payer: Self-pay | Admitting: Family Medicine

## 2019-11-29 ENCOUNTER — Other Ambulatory Visit: Payer: Self-pay

## 2019-11-29 ENCOUNTER — Ambulatory Visit (INDEPENDENT_AMBULATORY_CARE_PROVIDER_SITE_OTHER): Payer: Medicare Other | Admitting: Family Medicine

## 2019-11-29 VITALS — BP 128/80 | HR 89 | Temp 98.8°F | Ht 61.0 in | Wt 106.6 lb

## 2019-11-29 DIAGNOSIS — K625 Hemorrhage of anus and rectum: Secondary | ICD-10-CM | POA: Diagnosis not present

## 2019-11-29 DIAGNOSIS — R011 Cardiac murmur, unspecified: Secondary | ICD-10-CM | POA: Diagnosis not present

## 2019-11-29 NOTE — Patient Instructions (Signed)
It was a pleasure to see you today!  Thank you for choosing [facility] for your primary care.  Connie Wade was seen for rectal bleeding  Our plans for today were:  I have obtained labs to further evaluate your bleeding  Your sample of blood tested was negative for blood  I will call you with results and next steps. You would likely benefit from seeing the gastroenterologists   You have a heart murmur. This needs to be further evaluated. Please schedule an appointment with your PCP within the next month for further evaluation.  To keep you healthy, please keep in mind the following health maintenance items that you are due for:   1. Flu   Best Wishes,   Mina Marble, DO

## 2019-11-29 NOTE — Progress Notes (Signed)
   Subjective:   Patient ID: Connie Wade    DOB: 1955/09/04, 64 y.o. female   MRN: [id number]  Connie Wade is a 64 y.o. female with a history of HTN, allergic rhinitis, Hep B, neuropathy, h/o subarachnoid hemorrhage due to ruptured aneurysm, DDD, uterine fibroids, gout,  Heart murmur, and tobacco abuse here for rectal bleeding  Rectal Bleeding: Patient notes that she has diarrhea for 1 week. On Saturday she noticed BRBPR that filled up the entire toilet bowl. This also happened on Sunday. She endorses a chronic constant abdominal ache but denies any new or worsening abdominal pain. She notes that the bleeding has almost stopped; she notices a small amount mixed in her stool. She notes that her stool have become more firm. Denies any pain in rectum. Denies any dizziness, lightheadedness, SOB, CP. Denies family history of colon cancer. Did have a colonoscopy in 2018 that was noted to have scattered diverticula and multiple colon polyps determined to be tubular adenomas with recommendation for repeat colonoscopy in 5 years. She denies taking iron. Does endorse taking pepto one time 1 week prior. Denies NSAIDs or ASA use. Endorses some nausea but denies any vomiting. Denies any constipation.   Review of Systems:  Per HPI.   Objective:   BP 128/80   Pulse 89   Temp 98.8 F (37.1 C) (Other (Comment))   Ht 5\' 1"  (1.549 m)   Wt 106 lb 9.6 oz (48.4 kg)   SpO2 98%   BMI 20.14 kg/m  Vitals and nursing note reviewed.  General: pleasant older female, sitting comfortably in exam chair, well nourished, well developed, in no acute distress with non-toxic appearance CV: regular rate and rhythm, 2/5 systolic murmur appreciated on right upper sternal border, no rubs or gallops, no carotid bruits Lungs: clear to auscultation bilaterally with normal work of breathing on room air, speaking in full sentences Abdomen: soft, mild RUQ and RLQ abdominal pain,  non-tender, normoactive bowel  sounds Skin: warm, dry, no rashes or lesions Extremities: warm and well perfused, normal tone HFW:YOVZ normal Neuro: Alert and oriented, speech normal Rectal Exam + Anoscopy: No external or internal hemorroids appreicated. No fissures.NO active bleed appreicated. Soft brown stool noted.   Prior Imaging: Colonscopy in 2018 notable for scattered diverticula. 3 polyps were biopsied and returned as tubular adenomas. Recommendation for repeat in 5 years.  Assessment & Plan:   Bright red blood per rectum Acute and resolving. Hemodynamically stable on exam and asymptomatic. Anoscopy without signs of internal or external hemorrhoids. No active bleed appreciated. Soft brown stool noted. FOBT negative. Hgb stable at 11.7. CMP WNL. Differential at this time includes acute lower GI bleed from polyp vs diverticula vs angiodysplacia that has since resolved. Doubt diverticulitis given lack of pain. At this time recommend continuing to monitor.  Consider referral to GI if recurs given unclear cause and prior history. Continue to avoid NSAIDs and ASA.   Systolic murmur 2/5 systolic murmur appreciated at right upper sternal border. Patient asymptomatic.  Recommend continued monitoring by PCP and consider echo for baseline and further evaluation.   Orders Placed This Encounter  Procedures  . Fecal occult blood, imunochemical  . CBC  . Comprehensive metabolic panel   No orders of the defined types were placed in this encounter.  Mina Marble, DO PGY-3, [facility] 11/30/2019 9:46 PM

## 2019-11-30 DIAGNOSIS — K625 Hemorrhage of anus and rectum: Secondary | ICD-10-CM

## 2019-11-30 HISTORY — DX: Hemorrhage of anus and rectum: K62.5

## 2019-11-30 LAB — COMPREHENSIVE METABOLIC PANEL
ALT: 16 IU/L (ref 0–32)
AST: 21 IU/L (ref 0–40)
Albumin/Globulin Ratio: 1.6 (ref 1.2–2.2)
Albumin: 4.4 g/dL (ref 3.8–4.8)
Alkaline Phosphatase: 79 IU/L (ref 44–121)
BUN/Creatinine Ratio: 13 (ref 12–28)
BUN: 11 mg/dL (ref 8–27)
Bilirubin Total: 0.3 mg/dL (ref 0.0–1.2)
CO2: 25 mmol/L (ref 20–29)
Calcium: 9.7 mg/dL (ref 8.7–10.3)
Chloride: 102 mmol/L (ref 96–106)
Creatinine, Ser: 0.86 mg/dL (ref 0.57–1.00)
GFR calc Af Amer: 81 mL/min/{1.73_m2} (ref >59)
GFR calc non Af Amer: 71 mL/min/{1.73_m2} (ref >59)
Globulin, Total: 2.8 g/dL (ref 1.5–4.5)
Glucose: 90 mg/dL (ref 65–99)
Potassium: 4.5 mmol/L (ref 3.5–5.2)
Sodium: 140 mmol/L (ref 134–144)
Total Protein: 7.2 g/dL (ref 6.0–8.5)

## 2019-11-30 LAB — CBC
Hematocrit: 35 % (ref 34.0–46.6)
Hemoglobin: 11.7 g/dL (ref 11.1–15.9)
MCH: 26.5 pg — ABNORMAL LOW (ref 26.6–33.0)
MCHC: 33.4 g/dL (ref 31.5–35.7)
MCV: 79 fL (ref 79–97)
Platelets: 484 10*3/uL — ABNORMAL HIGH (ref 150–450)
RBC: 4.42 x10E6/uL (ref 3.77–5.28)
RDW: 16.2 % — ABNORMAL HIGH (ref 11.7–15.4)
WBC: 6 10*3/uL (ref 3.4–10.8)

## 2019-11-30 NOTE — Assessment & Plan Note (Signed)
2/5 systolic murmur appreciated at right upper sternal border. Patient asymptomatic.  Recommend continued monitoring by PCP and consider echo for baseline and further evaluation.

## 2019-11-30 NOTE — Assessment & Plan Note (Addendum)
Acute and resolving. Hemodynamically stable on exam and asymptomatic. Anoscopy without signs of internal or external hemorrhoids. No active bleed appreciated. Soft brown stool noted. FOBT negative. Hgb stable at 11.7. CMP WNL. Differential at this time includes acute lower GI bleed from polyp vs diverticula vs angiodysplacia that has since resolved. Doubt diverticulitis given lack of pain. At this time recommend continuing to monitor.  Consider referral to GI if recurs given unclear cause and prior history. Continue to avoid NSAIDs and ASA.

## 2019-12-02 LAB — FECAL OCCULT BLOOD, IMMUNOCHEMICAL: Fecal Occult Bld: POSITIVE — AB

## 2019-12-11 ENCOUNTER — Other Ambulatory Visit: Payer: Self-pay

## 2019-12-11 ENCOUNTER — Ambulatory Visit (INDEPENDENT_AMBULATORY_CARE_PROVIDER_SITE_OTHER): Payer: Medicare Other | Admitting: Family Medicine

## 2019-12-11 ENCOUNTER — Encounter: Payer: Self-pay | Admitting: Family Medicine

## 2019-12-11 VITALS — BP 120/70 | HR 67 | Ht 61.0 in | Wt 106.4 lb

## 2019-12-11 DIAGNOSIS — R110 Nausea: Secondary | ICD-10-CM

## 2019-12-11 DIAGNOSIS — Z23 Encounter for immunization: Secondary | ICD-10-CM | POA: Diagnosis not present

## 2019-12-11 DIAGNOSIS — B20 Human immunodeficiency virus [HIV] disease: Secondary | ICD-10-CM

## 2019-12-11 DIAGNOSIS — E785 Hyperlipidemia, unspecified: Secondary | ICD-10-CM

## 2019-12-11 DIAGNOSIS — R011 Cardiac murmur, unspecified: Secondary | ICD-10-CM

## 2019-12-11 NOTE — Progress Notes (Signed)
    SUBJECTIVE:   CHIEF COMPLAINT / HPI:   Morning nausea Connie Wade reports that she has been having morning nausea for about the past week.  Once she gets through the morning, this nausea typically goes away.  She does not associate the nausea with any abdominal pain although she does have chronic, generalized abdominal pain which has not been changing.  She was seen in clinic about 2 weeks ago for a 2-day episode of rectal bleeding which resolved without any significant intervention.  In addition to these symptoms, she does note some increasing fatigue recently.  HIV positive, elite suppressor She follows with infectious disease annually although she has been having trouble getting an appointment due to scheduling issues.  She knows that she is due for blood work but has been having trouble scheduling an appointment.  Systolic heart murmur This is been noted at multiple of her recent visits.  Because it keeps coming up, she would like to have it investigated with imaging if necessary.  She specifically denies shortness of breath, orthopnea, chest pain, palpitations.  PERTINENT  PMH / PSH: Recent GI bleed, HIV positive  OBJECTIVE:   BP 120/70   Pulse 67   Ht 5\' 1"  (1.549 m)   Wt 106 lb 6 oz (48.3 kg)   SpO2 98%   BMI 20.10 kg/m    General: Alert and cooperative and appears to be in no acute distress HEENT: Neck non-tender without lymphadenopathy, masses or thyromegaly Cardio: Normal S1 and S2, no S3 or S4. Rhythm is regular.  1/6 systolic murmur no rubs.   Pulm: Clear to auscultation bilaterally, no crackles, wheezing, or diminished breath sounds. Normal respiratory effort Abdomen: Soft abdomen, diffusely tender to soft palpation.  Not peritoneal.  Guarding on exam. Extremities: No peripheral edema. Warm/ well perfused.  Strong radial pulse.   ASSESSMENT/PLAN:   Systolic murmur Most suspicious for mitral regurgitation or aortic stenosis.  No apparent symptoms based on  history. -Follow-up echo  Human immunodeficiency virus (HIV) disease She typically follows regularly with her infectious disease doctor.  She has had trouble with scheduling to get her lab work done to follow-up.  We will get routine labs today and assess for her CD4 count and viral load.  She does not currently take any antiretrovirals as she is an elite suppressor.  I think these labs are warranted based on her symptoms of fatigue and nausea. -Follow-up CD4 count, viral load -Follow-up with ID as schedule permits  Nausea In the setting of a recent GI bleed and chronic abdominal pain, I would like her to follow-up with GI for this new morning nausea.  Recent blood work demonstrated no kidney or electrolyte abnormalities.  The differential at this time certainly includes poorly controlled HIV infection, GI neoplasm, viral gastroenteritis.  If severe GI illness and worsened HIV infection can be ruled out, this is most likely benign etiology.  She is otherwise up-to-date with her cancer screening. -Placed referral to GI -Follow-up HIV viral load and CD4 count -Ginger for nausea  HLD (hyperlipidemia) -Follow-up lipid panel     Connie Haymaker, MD [place]

## 2019-12-11 NOTE — Assessment & Plan Note (Signed)
Follow-up lipid panel 

## 2019-12-11 NOTE — Assessment & Plan Note (Signed)
Most suspicious for mitral regurgitation or aortic stenosis.  No apparent symptoms based on history. -Follow-up echo

## 2019-12-11 NOTE — Patient Instructions (Signed)
Nausea: I am sorry that this is been such an issue lately.  Because you have been having this nausea and a recent GI bleed, I would like you to follow-up with the stomach and intestinal doctors.  I put in referral today.  You should get a call in the next 1-2 weeks to set up an appointment with them.  I will also get your basic HIV labs to ensure that remains well controlled.  Please try to follow-up with the infectious disease doctors when you are able.  I recommend ginger for over-the-counter medicine that can be helpful for nausea.  Please return to clinic in 1 month to make sure you are on the right track.

## 2019-12-11 NOTE — Assessment & Plan Note (Addendum)
In the setting of a recent GI bleed and chronic abdominal pain, I would like her to follow-up with GI for this new morning nausea.  Recent blood work demonstrated no kidney or electrolyte abnormalities.  The differential at this time certainly includes poorly controlled HIV infection, GI neoplasm, viral gastroenteritis.  If severe GI illness and worsened HIV infection can be ruled out, this is most likely benign etiology.  She is otherwise up-to-date with her cancer screening. -Placed referral to GI -Follow-up HIV viral load and CD4 count -Ginger for nausea

## 2019-12-11 NOTE — Assessment & Plan Note (Signed)
>>  ASSESSMENT AND PLAN FOR HLD (HYPERLIPIDEMIA) WRITTEN ON 12/11/2019  5:31 PM BY Mirian Mo, MD  -Follow-up lipid panel

## 2019-12-11 NOTE — Assessment & Plan Note (Signed)
She typically follows regularly with her infectious disease doctor.  She has had trouble with scheduling to get her lab work done to follow-up.  We will get routine labs today and assess for her CD4 count and viral load.  She does not currently take any antiretrovirals as she is an elite suppressor.  I think these labs are warranted based on her symptoms of fatigue and nausea. -Follow-up CD4 count, viral load -Follow-up with ID as schedule permits

## 2019-12-12 ENCOUNTER — Other Ambulatory Visit: Payer: Self-pay | Admitting: Family Medicine

## 2019-12-12 LAB — T-HELPER CELLS (CD4) COUNT (NOT AT ARMC)
% CD 4 Pos. Lymph.: 45.3 % (ref 30.8–58.5)
Absolute CD 4 Helper: 1133 /uL (ref 359–1519)
Basophils Absolute: 0.1 10*3/uL (ref 0.0–0.2)
Basos: 1 %
EOS (ABSOLUTE): 0 10*3/uL (ref 0.0–0.4)
Eos: 1 %
Hematocrit: 33.5 % — ABNORMAL LOW (ref 34.0–46.6)
Hemoglobin: 11.5 g/dL (ref 11.1–15.9)
Immature Grans (Abs): 0 10*3/uL (ref 0.0–0.1)
Immature Granulocytes: 0 %
Lymphocytes Absolute: 2.5 10*3/uL (ref 0.7–3.1)
Lymphs: 42 %
MCH: 26.5 pg — ABNORMAL LOW (ref 26.6–33.0)
MCHC: 34.3 g/dL (ref 31.5–35.7)
MCV: 77 fL — ABNORMAL LOW (ref 79–97)
Monocytes Absolute: 0.5 10*3/uL (ref 0.1–0.9)
Monocytes: 8 %
Neutrophils Absolute: 2.9 10*3/uL (ref 1.4–7.0)
Neutrophils: 48 %
Platelets: 501 10*3/uL — ABNORMAL HIGH (ref 150–450)
RBC: 4.34 x10E6/uL (ref 3.77–5.28)
RDW: 15.6 % — ABNORMAL HIGH (ref 11.7–15.4)
WBC: 6.1 10*3/uL (ref 3.4–10.8)

## 2019-12-12 LAB — LIPID PANEL
Chol/HDL Ratio: 2.5 ratio (ref 0.0–4.4)
Cholesterol, Total: 183 mg/dL (ref 100–199)
HDL: 73 mg/dL (ref >39)
LDL Chol Calc (NIH): 89 mg/dL (ref 0–99)
Triglycerides: 123 mg/dL (ref 0–149)
VLDL Cholesterol Cal: 21 mg/dL (ref 5–40)

## 2019-12-12 LAB — HIV-1 RNA QUANT-NO REFLEX-BLD: HIV-1 RNA Viral Load: <20 copies/mL

## 2019-12-13 ENCOUNTER — Ambulatory Visit (HOSPITAL_COMMUNITY)
Admission: RE | Admit: 2019-12-13 | Discharge: 2019-12-13 | Disposition: A | Discharge status: Home / Self Care | Payer: Medicare Other | Source: Ambulatory Visit | Attending: Family Medicine | Admitting: Family Medicine

## 2019-12-13 ENCOUNTER — Other Ambulatory Visit: Payer: Self-pay

## 2019-12-13 DIAGNOSIS — I10 Essential (primary) hypertension: Secondary | ICD-10-CM | POA: Diagnosis not present

## 2019-12-13 DIAGNOSIS — E785 Hyperlipidemia, unspecified: Secondary | ICD-10-CM | POA: Insufficient documentation

## 2019-12-13 DIAGNOSIS — D649 Anemia, unspecified: Secondary | ICD-10-CM | POA: Diagnosis not present

## 2019-12-13 DIAGNOSIS — R011 Cardiac murmur, unspecified: Secondary | ICD-10-CM | POA: Insufficient documentation

## 2019-12-13 LAB — ECHOCARDIOGRAM COMPLETE
Area-P 1/2: 2 cm2
S' Lateral: 2.4 cm

## 2019-12-13 NOTE — Progress Notes (Signed)
  Echocardiogram 2D Echocardiogram has been performed.  Darlina Sicilian M 12/13/2019, 10:44 AM

## 2019-12-15 ENCOUNTER — Other Ambulatory Visit: Payer: Self-pay | Admitting: Family Medicine

## 2019-12-15 DIAGNOSIS — I358 Other nonrheumatic aortic valve disorders: Secondary | ICD-10-CM

## 2019-12-25 ENCOUNTER — Encounter: Payer: Self-pay | Admitting: Family Medicine

## 2019-12-26 DIAGNOSIS — H524 Presbyopia: Secondary | ICD-10-CM | POA: Diagnosis not present

## 2019-12-26 DIAGNOSIS — H25093 Other age-related incipient cataract, bilateral: Secondary | ICD-10-CM | POA: Diagnosis not present

## 2020-01-08 ENCOUNTER — Other Ambulatory Visit: Payer: Self-pay

## 2020-01-08 ENCOUNTER — Encounter: Payer: Self-pay | Admitting: Cardiology

## 2020-01-08 ENCOUNTER — Ambulatory Visit: Payer: Medicare Other | Admitting: Cardiology

## 2020-01-08 VITALS — BP 130/72 | HR 80 | Ht 61.0 in | Wt 106.0 lb

## 2020-01-08 DIAGNOSIS — I358 Other nonrheumatic aortic valve disorders: Secondary | ICD-10-CM | POA: Diagnosis not present

## 2020-01-08 DIAGNOSIS — I10 Essential (primary) hypertension: Secondary | ICD-10-CM | POA: Diagnosis not present

## 2020-01-08 NOTE — Progress Notes (Signed)
Cardiology Consult Note    Date:  01/08/2020   ID:  Wade, Connie 07/13/1955, MRN [id number]  PCP:  Matilde Haymaker, MD  Cardiologist:  Fransico Him, MD   Chief Complaint  Patient presents with  . New Patient (Initial Visit)    AV mass     History of Present Illness:  Connie Wade is a 64 y.o. female who is being seen today for the evaluation of AV mass at the request of Andrena Mews T, MD.  This is a 64yo AAF with a hx of HIV, Hep B, HLD, HTN and tobacco abuse.  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  She is here today for evaulation.  She denies any chest pain or pressure, SOB, DOE, PND, orthopnea, LE edema, dizziness, palpitations or syncope. She denies any recent fever, chills or recent infections.  She has a hx of tobacco use in the past and now smokes marijuana.  No hx of IVDA.  She is compliant with her meds and is tolerating meds with no SE.     Past Medical History:  Diagnosis Date  . ANEMIA, OTHER, UNSPECIFIED 03/25/2006  . DEPRESSION 03/17/2007  . HEPATITIS B 03/25/2006  . HIV 03/25/2006   since 2000  . HYDRADENITIS 03/25/2006  . HYPERLIPIDEMIA 03/25/2006  . HYPERTENSION, BENIGN ESSENTIAL 04/26/2007  . TOBACCO DEPENDENCE 03/25/2006  . Tubular adenoma of colon 06/23/2011   Benign, no high grade dysplasia 06/16/11 Followed by [name] Dr. Watt Climes Repeat in 5 years (due 2018)  . UTERINE FIBROID 03/25/2006    Past Surgical History:  Procedure Laterality Date  . BREAST LUMPECTOMY Bilateral   . coil     anuerysm  . IR GENERIC HISTORICAL  07/26/2015   IR ANGIO INTRA EXTRACRAN SEL INTERNAL CAROTID BILAT MOD SED 07/26/2015 Consuella Lose, MD [facility]  . IR GENERIC HISTORICAL  07/26/2015   IR ANGIO VERTEBRAL SEL VERTEBRAL BILAT MOD SED 07/26/2015  Consuella Lose, MD [facility]  . OVARIAN CYST REMOVAL  2001  . RADIOLOGY WITH ANESTHESIA N/A 12/05/2014   Procedure: RADIOLOGY WITH ANESTHESIA-COING FOR SUBARACHNOID HEMORRHAGE;  Surgeon: Consuella Lose, MD;  Location: [facility];  Service: Radiology;  Laterality: N/A;    Current Medications: Current Meds  Medication Sig  . acetaminophen (TYLENOL) 500 MG tablet Take 1,000 mg by mouth every 6 (six) hours as needed for moderate pain or headache.  . cyclobenzaprine (FLEXERIL) 10 MG tablet Take 1 tablet (10 mg total) by mouth 2 (two) times daily as needed for muscle spasms. Start taking this medication at night to help with you acute back pain.  . fluticasone (FLONASE) 50 MCG/ACT nasal spray SPRAY 2 SPRAYS INTO EACH NOSTRIL EVERY DAY  . gabapentin (NEURONTIN) 300 MG capsule Take 1 capsule (300 mg total) by mouth 2 (two) times daily.  Marland Kitchen lisinopril (ZESTRIL) 20 MG tablet TAKE 1 TABLET BY MOUTH EVERY DAY    Allergies:   Asa [aspirin], Nsaids, and Shrimp [shellfish allergy]   Social History   Socioeconomic History  . Marital status: Widowed    Spouse name: Not on file  . Number of children: Not on file  . Years of education: Not on file  . Highest education level: Not on file  Occupational History  . Not on file  Tobacco Use  . Smoking status: Former Smoker    Packs/day: 1.00    Types: Cigarettes    Quit date: 12/17/2014    Years since quitting: 5.0  . Smokeless tobacco: Never Used  Substance and Sexual Activity  . Alcohol use: No    Alcohol/week: 1.0 standard drink    Types: 1 Glasses of wine per week    Comment: EVERYDAY, three beers today  . Drug use: Yes    Types: Marijuana  . Sexual activity: Not Currently    Partners: Male  Other Topics Concern  . Not on file  Social History Narrative  . Not on file   Social Determinants of Health   Financial Resource Strain: Not on file  Food Insecurity: Not on file  Transportation Needs: Not on file  Physical Activity: Not on  file  Stress: Not on file  Social Connections: Not on file     Family History:  The patient's family history includes Alcohol abuse in her father; Arthritis in her mother; COPD in her sister.   ROS:   Please see the history of present illness.    ROS All other systems reviewed and are negative.  No flowsheet data found.   PHYSICAL EXAM:   VS:  BP 130/72   Pulse 80   Ht 5\' 1"  (1.549 m)   Wt 106 lb (48.1 kg)   SpO2 98%   BMI 20.03 kg/m     GEN: Well nourished, well developed, in no acute distress  HEENT: normal  Neck: no JVD, carotid bruits, or masses Cardiac: RRR; no murmurs, rubs, or gallops,no edema.  Intact distal pulses bilaterally.  Respiratory:  clear to auscultation bilaterally, normal work of breathing GI: soft, nontender, nondistended, + BS MS: no deformity or atrophy  Skin: warm and dry, no rash Neuro:  Alert and Oriented x 3, Strength and sensation are intact Psych: euthymic mood, full affect  Wt Readings from Last 3 Encounters:  01/08/20 106 lb (48.1 kg)  12/11/19 106 lb 6 oz (48.3 kg)  11/29/19 106 lb 9.6 oz (48.4 kg)      Studies/Labs Reviewed:   EKG:  EKG is ordered today.  The ekg ordered today demonstrates NSR with no ST changes  Recent Labs: 11/29/2019: ALT 16; BUN 11; Creatinine, Ser 0.86; Potassium 4.5; Sodium 140 12/11/2019: Hemoglobin 11.5; Platelets 501   Lipid Panel    Component Value Date/Time   CHOL 183 12/11/2019 1107   TRIG 123 12/11/2019 1107   HDL 73 12/11/2019 1107   CHOLHDL 2.5 12/11/2019 1107   CHOLHDL 2.2 05/13/2016 1054   VLDL 14 05/13/2016 1054   LDLCALC 89 12/11/2019 1107   LDLDIRECT 109 12/24/2014 1114    Additional studies/ records that were reviewed today include:  2D echo IMPRESSIONS    1. There is a small mass attached to the non-coronary cusp of the aortic  valve that measures 0.6 cm x 0.5 cm. Suspect this represents calcium as  the structure is bright and moves with the leaflet. There is no  significant  regurgitation or destruction of  the valve to suggest endocarditis. Also could represent papillary  fibroelastoma. Would recommend a TEE for better characterization. The  aortic valve is tricuspid. Aortic valve regurgitation is not visualized.  No aortic stenosis is present.  2. Left ventricular ejection fraction, by estimation, is 65 to 70%. The  left ventricle has normal function. The left ventricle has no regional  wall motion abnormalities. Left ventricular diastolic parameters were  normal.  The average left ventricular  global longitudinal strain is -19.7 %. The global longitudinal strain is  normal.  3. Right ventricular systolic function is normal. The right ventricular  size is normal. Tricuspid regurgitation signal is inadequate for assessing  PA pressure.  4. The mitral valve is grossly normal. No evidence of mitral valve  regurgitation. No evidence of mitral stenosis.  5. The inferior vena cava is normal in size with greater than 50%  respiratory variability, suggesting right atrial pressure of 3 mmHg.     ASSESSMENT:    1. Aortic valve mass   2. HYPERTENSION, BENIGN ESSENTIAL      PLAN:  In order of problems listed above:  1. Aortic valve mass -2D echo showed a 0.6 x 0.5cm bright mass on the non coronary cusp of the AV with no AI and possible representing a papillary fibroelastoma or calcified cups -she is symptomatic -no hx of IVDA -there is no regurgitation on the echo and no recent fever, chills or illness to be concerned over endocarditis -recommend TEE for further evaluation -After careful review of history and examination, the risks and benefits of transesophageal echocardiogram have been explained including risks of esophageal damage, perforation (1:10,000 risk), bleeding, pharyngeal hematoma as well as other potential complications associated with conscious sedation including aspiration, arrhythmia, respiratory failure and death. Alternatives to treatment  were discussed, questions were answered. Patient is willing to proceed.   2.  HTN -BP controlled one exam -continue Zestril 20mg  daily     Medication Adjustments/Labs and Tests Ordered: Current medicines are reviewed at length with the patient today.  Concerns regarding medicines are outlined above.  Medication changes, Labs and Tests ordered today are listed in the Patient Instructions below.  There are no Patient Instructions on file for this visit.   Signed, Fransico Him, MD  01/08/2020 8:59 AM    [facility] [address], [place], [place]  [id number] Phone: [phone]; Fax: [phone]

## 2020-01-08 NOTE — Addendum Note (Signed)
Addended by: Antonieta Iba on: 01/08/2020 09:20 AM   Modules accepted: Orders

## 2020-01-08 NOTE — H&P (View-Only) (Signed)
Cardiology Consult Note    Date:  01/08/2020   ID:  Connie Wade, Connie Wade May 29, 1955, MRN [id number]  PCP:  Matilde Haymaker, MD  Cardiologist:  Fransico Him, MD   Chief Complaint  Patient presents with  . New Patient (Initial Visit)    AV mass     History of Present Illness:  Connie Wade is a 64 y.o. female who is being seen today for the evaluation of AV mass at the request of Andrena Mews T, MD.  This is a 64yo AAF with a hx of HIV, Hep B, HLD, HTN and tobacco abuse.  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  She is here today for evaulation.  She denies any chest pain or pressure, SOB, DOE, PND, orthopnea, LE edema, dizziness, palpitations or syncope. She denies any recent fever, chills or recent infections.  She has a hx of tobacco use in the past and now smokes marijuana.  No hx of IVDA.  She is compliant with her meds and is tolerating meds with no SE.     Past Medical History:  Diagnosis Date  . ANEMIA, OTHER, UNSPECIFIED 03/25/2006  . DEPRESSION 03/17/2007  . HEPATITIS B 03/25/2006  . HIV 03/25/2006   since 2000  . HYDRADENITIS 03/25/2006  . HYPERLIPIDEMIA 03/25/2006  . HYPERTENSION, BENIGN ESSENTIAL 04/26/2007  . TOBACCO DEPENDENCE 03/25/2006  . Tubular adenoma of colon 06/23/2011   Benign, no high grade dysplasia 06/16/11 Followed by [name] Dr. Watt Climes Repeat in 5 years (due 2018)  . UTERINE FIBROID 03/25/2006    Past Surgical History:  Procedure Laterality Date  . BREAST LUMPECTOMY Bilateral   . coil     anuerysm  . IR GENERIC HISTORICAL  07/26/2015   IR ANGIO INTRA EXTRACRAN SEL INTERNAL CAROTID BILAT MOD SED 07/26/2015 Consuella Lose, MD [facility]  . IR GENERIC HISTORICAL  07/26/2015   IR ANGIO VERTEBRAL SEL VERTEBRAL BILAT MOD SED 07/26/2015  Consuella Lose, MD [facility]  . OVARIAN CYST REMOVAL  2001  . RADIOLOGY WITH ANESTHESIA N/A 12/05/2014   Procedure: RADIOLOGY WITH ANESTHESIA-COING FOR SUBARACHNOID HEMORRHAGE;  Surgeon: Consuella Lose, MD;  Location: [facility];  Service: Radiology;  Laterality: N/A;    Current Medications: Current Meds  Medication Sig  . acetaminophen (TYLENOL) 500 MG tablet Take 1,000 mg by mouth every 6 (six) hours as needed for moderate pain or headache.  . cyclobenzaprine (FLEXERIL) 10 MG tablet Take 1 tablet (10 mg total) by mouth 2 (two) times daily as needed for muscle spasms. Start taking this medication at night to help with you acute back pain.  . fluticasone (FLONASE) 50 MCG/ACT nasal spray SPRAY 2 SPRAYS INTO EACH NOSTRIL EVERY DAY  . gabapentin (NEURONTIN) 300 MG capsule Take 1 capsule (300 mg total) by mouth 2 (two) times daily.  Marland Kitchen lisinopril (ZESTRIL) 20 MG tablet TAKE 1 TABLET BY MOUTH EVERY DAY    Allergies:   Asa [aspirin], Nsaids, and Shrimp [shellfish allergy]   Social History   Socioeconomic History  . Marital status: Widowed    Spouse name: Not on file  . Number of children: Not on file  . Years of education: Not on file  . Highest education level: Not on file  Occupational History  . Not on file  Tobacco Use  . Smoking status: Former Smoker    Packs/day: 1.00    Types: Cigarettes    Quit date: 12/17/2014    Years since quitting: 5.0  . Smokeless tobacco: Never Used  Substance and Sexual Activity  . Alcohol use: No    Alcohol/week: 1.0 standard drink    Types: 1 Glasses of wine per week    Comment: EVERYDAY, three beers today  . Drug use: Yes    Types: Marijuana  . Sexual activity: Not Currently    Partners: Male  Other Topics Concern  . Not on file  Social History Narrative  . Not on file   Social Determinants of Health   Financial Resource Strain: Not on file  Food Insecurity: Not on file  Transportation Needs: Not on file  Physical Activity: Not on  file  Stress: Not on file  Social Connections: Not on file     Family History:  The patient's family history includes Alcohol abuse in her father; Arthritis in her mother; COPD in her sister.   ROS:   Please see the history of present illness.    ROS All other systems reviewed and are negative.  No flowsheet data found.   PHYSICAL EXAM:   VS:  BP 130/72   Pulse 80   Ht 5\' 1"  (1.549 m)   Wt 106 lb (48.1 kg)   SpO2 98%   BMI 20.03 kg/m     GEN: Well nourished, well developed, in no acute distress  HEENT: normal  Neck: no JVD, carotid bruits, or masses Cardiac: RRR; no murmurs, rubs, or gallops,no edema.  Intact distal pulses bilaterally.  Respiratory:  clear to auscultation bilaterally, normal work of breathing GI: soft, nontender, nondistended, + BS MS: no deformity or atrophy  Skin: warm and dry, no rash Neuro:  Alert and Oriented x 3, Strength and sensation are intact Psych: euthymic mood, full affect  Wt Readings from Last 3 Encounters:  01/08/20 106 lb (48.1 kg)  12/11/19 106 lb 6 oz (48.3 kg)  11/29/19 106 lb 9.6 oz (48.4 kg)      Studies/Labs Reviewed:   EKG:  EKG is ordered today.  The ekg ordered today demonstrates NSR with no ST changes  Recent Labs: 11/29/2019: ALT 16; BUN 11; Creatinine, Ser 0.86; Potassium 4.5; Sodium 140 12/11/2019: Hemoglobin 11.5; Platelets 501   Lipid Panel    Component Value Date/Time   CHOL 183 12/11/2019 1107   TRIG 123 12/11/2019 1107   HDL 73 12/11/2019 1107   CHOLHDL 2.5 12/11/2019 1107   CHOLHDL 2.2 05/13/2016 1054   VLDL 14 05/13/2016 1054   LDLCALC 89 12/11/2019 1107   LDLDIRECT 109 12/24/2014 1114    Additional studies/ records that were reviewed today include:  2D echo IMPRESSIONS    1. There is a small mass attached to the non-coronary cusp of the aortic  valve that measures 0.6 cm x 0.5 cm. Suspect this represents calcium as  the structure is bright and moves with the leaflet. There is no  significant  regurgitation or destruction of  the valve to suggest endocarditis. Also could represent papillary  fibroelastoma. Would recommend a TEE for better characterization. The  aortic valve is tricuspid. Aortic valve regurgitation is not visualized.  No aortic stenosis is present.  2. Left ventricular ejection fraction, by estimation, is 65 to 70%. The  left ventricle has normal function. The left ventricle has no regional  wall motion abnormalities. Left ventricular diastolic parameters were  normal.  The average left ventricular  global longitudinal strain is -19.7 %. The global longitudinal strain is  normal.  3. Right ventricular systolic function is normal. The right ventricular  size is normal. Tricuspid regurgitation signal is inadequate for assessing  PA pressure.  4. The mitral valve is grossly normal. No evidence of mitral valve  regurgitation. No evidence of mitral stenosis.  5. The inferior vena cava is normal in size with greater than 50%  respiratory variability, suggesting right atrial pressure of 3 mmHg.     ASSESSMENT:    1. Aortic valve mass   2. HYPERTENSION, BENIGN ESSENTIAL      PLAN:  In order of problems listed above:  1. Aortic valve mass -2D echo showed a 0.6 x 0.5cm bright mass on the non coronary cusp of the AV with no AI and possible representing a papillary fibroelastoma or calcified cups -she is symptomatic -no hx of IVDA -there is no regurgitation on the echo and no recent fever, chills or illness to be concerned over endocarditis -recommend TEE for further evaluation -After careful review of history and examination, the risks and benefits of transesophageal echocardiogram have been explained including risks of esophageal damage, perforation (1:10,000 risk), bleeding, pharyngeal hematoma as well as other potential complications associated with conscious sedation including aspiration, arrhythmia, respiratory failure and death. Alternatives to treatment  were discussed, questions were answered. Patient is willing to proceed.   2.  HTN -BP controlled one exam -continue Zestril 20mg  daily     Medication Adjustments/Labs and Tests Ordered: Current medicines are reviewed at length with the patient today.  Concerns regarding medicines are outlined above.  Medication changes, Labs and Tests ordered today are listed in the Patient Instructions below.  There are no Patient Instructions on file for this visit.   Signed, Fransico Him, MD  01/08/2020 8:59 AM    [facility] [employer], [place], [place]  [address] Phone: [phone]; Fax: [phone]

## 2020-01-08 NOTE — Patient Instructions (Signed)
Medication Instructions:  Your physician recommends that you continue on your current medications as directed. Please refer to the Current Medication list given to you today.  *If you need a refill on your cardiac medications before your next appointment, please call your pharmacy*  Follow-Up: At [facility], you and your health needs are our priority.  As part of our continuing mission to provide you with exceptional heart care, we have created designated Provider Care Teams.  These Care Teams include your primary Cardiologist (physician) and Advanced Practice Providers (APPs -  Physician Assistants and Nurse Practitioners) who all work together to provide you with the care you need, when you need it.  Your next appointment:   1 year(s)  The format for your next appointment:   In Person  Provider:   You may see Fransico Him, MD or one of the following Advanced Practice Providers on your designated Care Team:    Melina Copa, PA-C  Ermalinda Barrios, PA-C    Other Instructions  You are scheduled for a TEE on Tuesday, December 28th with Dr. Radford Pax.  Please arrive at the [facility] (Main Entrance A) at [facility]: [address] [place], [place] [address] at 6:30am. (1 hour prior to procedure unless lab work is needed; if lab work is needed arrive 1.5 hours ahead)  DIET: Nothing to eat or drink after midnight except a sip of water with medications (see medication instructions below)  Labs:  Come to the lab at [facility] on Tuesday, December 21st, 2021 between the hours of 8:00 am and 4:30 pm. You do not have to be fasting.  COVID Screening: Due to recent COVID-19 restrictions implemented by our local and state authorities and in an effort to keep both patients and staff as safe as possible, our hospital system requires COVID-19 testing prior to certain scheduled hospital procedures. Please go to [facility] [place], [place] [address] on Monday, December 27th at  8:20am. This is a drive up testing site. You will not need to exit your vehicle. You will not be billed at the time of testing but may receive a bill later depending on your insurance. The approximate cost of the test is $100. You must agree to self-quarantine from the time of your testing until the procedure date on December 28th, 2022. This should included staying home with ONLY the people you live with. Avoid take-out, grocery store shopping or leaving the house for any non-emergent reason. Failure to have your COVID-19 test done on the date and time you have been scheduled will result in cancellation of your procedure. Please call our office at [phone] if you have any questions.    You must have a responsible person to drive you home and stay in the waiting area during your procedure. Failure to do so could result in cancellation.  Bring your insurance cards.  *Special Note: Every effort is made to have your procedure done on time. Occasionally there are emergencies that occur at the hospital that may cause delays. Please be patient if a delay does occur.

## 2020-01-16 ENCOUNTER — Other Ambulatory Visit: Payer: Medicare Other

## 2020-01-16 ENCOUNTER — Other Ambulatory Visit: Payer: Self-pay

## 2020-01-16 DIAGNOSIS — I10 Essential (primary) hypertension: Secondary | ICD-10-CM

## 2020-01-16 DIAGNOSIS — I358 Other nonrheumatic aortic valve disorders: Secondary | ICD-10-CM

## 2020-01-16 LAB — CBC
Hematocrit: 34 % (ref 34.0–46.6)
Hemoglobin: 11.8 g/dL (ref 11.1–15.9)
MCH: 26.2 pg — ABNORMAL LOW (ref 26.6–33.0)
MCHC: 34.7 g/dL (ref 31.5–35.7)
MCV: 76 fL — ABNORMAL LOW (ref 79–97)
Platelets: 473 10*3/uL — ABNORMAL HIGH (ref 150–450)
RBC: 4.5 x10E6/uL (ref 3.77–5.28)
RDW: 15.7 % — ABNORMAL HIGH (ref 11.7–15.4)
WBC: 6.3 10*3/uL (ref 3.4–10.8)

## 2020-01-16 LAB — BASIC METABOLIC PANEL
BUN/Creatinine Ratio: 16 (ref 12–28)
BUN: 9 mg/dL (ref 8–27)
CO2: 26 mmol/L (ref 20–29)
Calcium: 9.6 mg/dL (ref 8.7–10.3)
Chloride: 105 mmol/L (ref 96–106)
Creatinine, Ser: 0.58 mg/dL (ref 0.57–1.00)
GFR calc Af Amer: 111 mL/min/{1.73_m2} (ref >59)
GFR calc non Af Amer: 96 mL/min/{1.73_m2} (ref >59)
Glucose: 79 mg/dL (ref 65–99)
Potassium: 4 mmol/L (ref 3.5–5.2)
Sodium: 139 mmol/L (ref 134–144)

## 2020-01-22 ENCOUNTER — Other Ambulatory Visit (HOSPITAL_COMMUNITY)
Admission: RE | Admit: 2020-01-22 | Discharge: 2020-01-22 | Disposition: A | Discharge status: Home / Self Care | Payer: Medicare Other | Source: Ambulatory Visit | Attending: Cardiology | Admitting: Cardiology

## 2020-01-22 ENCOUNTER — Other Ambulatory Visit: Payer: Self-pay | Admitting: Cardiology

## 2020-01-22 DIAGNOSIS — Z20822 Contact with and (suspected) exposure to covid-19: Secondary | ICD-10-CM | POA: Diagnosis not present

## 2020-01-22 DIAGNOSIS — I358 Other nonrheumatic aortic valve disorders: Secondary | ICD-10-CM

## 2020-01-22 DIAGNOSIS — Z01812 Encounter for preprocedural laboratory examination: Secondary | ICD-10-CM | POA: Insufficient documentation

## 2020-01-22 LAB — SARS CORONAVIRUS 2 (TAT 6-24 HRS): SARS Coronavirus 2: NEGATIVE

## 2020-01-23 ENCOUNTER — Ambulatory Visit (HOSPITAL_BASED_OUTPATIENT_CLINIC_OR_DEPARTMENT_OTHER)
Admission: RE | Admit: 2020-01-23 | Discharge: 2020-01-23 | Disposition: A | Discharge status: Home / Self Care | Payer: Medicare Other | Source: Home / Self Care | Attending: Cardiology | Admitting: Cardiology

## 2020-01-23 ENCOUNTER — Other Ambulatory Visit: Payer: Self-pay

## 2020-01-23 ENCOUNTER — Ambulatory Visit (HOSPITAL_COMMUNITY): Payer: Medicare Other | Admitting: Certified Registered"

## 2020-01-23 ENCOUNTER — Encounter (HOSPITAL_COMMUNITY)
Admission: RE | Disposition: A | Discharge status: Home / Self Care | Payer: Self-pay | Source: Home / Self Care | Attending: Cardiology

## 2020-01-23 ENCOUNTER — Encounter (HOSPITAL_COMMUNITY): Payer: Self-pay | Admitting: Cardiology

## 2020-01-23 ENCOUNTER — Ambulatory Visit (HOSPITAL_COMMUNITY)
Admission: RE | Admit: 2020-01-23 | Discharge: 2020-01-23 | Disposition: A | Discharge status: Home / Self Care | Payer: Medicare Other | Attending: Cardiology | Admitting: Cardiology

## 2020-01-23 DIAGNOSIS — I340 Nonrheumatic mitral (valve) insufficiency: Secondary | ICD-10-CM | POA: Diagnosis not present

## 2020-01-23 DIAGNOSIS — I10 Essential (primary) hypertension: Secondary | ICD-10-CM | POA: Diagnosis not present

## 2020-01-23 DIAGNOSIS — I358 Other nonrheumatic aortic valve disorders: Secondary | ICD-10-CM | POA: Diagnosis not present

## 2020-01-23 DIAGNOSIS — Z87891 Personal history of nicotine dependence: Secondary | ICD-10-CM | POA: Diagnosis not present

## 2020-01-23 DIAGNOSIS — Z79899 Other long term (current) drug therapy: Secondary | ICD-10-CM | POA: Insufficient documentation

## 2020-01-23 DIAGNOSIS — Z886 Allergy status to analgesic agent status: Secondary | ICD-10-CM | POA: Diagnosis not present

## 2020-01-23 DIAGNOSIS — E785 Hyperlipidemia, unspecified: Secondary | ICD-10-CM | POA: Diagnosis not present

## 2020-01-23 DIAGNOSIS — M5136 Other intervertebral disc degeneration, lumbar region: Secondary | ICD-10-CM

## 2020-01-23 DIAGNOSIS — Z91013 Allergy to seafood: Secondary | ICD-10-CM | POA: Insufficient documentation

## 2020-01-23 DIAGNOSIS — I080 Rheumatic disorders of both mitral and aortic valves: Secondary | ICD-10-CM | POA: Diagnosis not present

## 2020-01-23 HISTORY — PX: TEE WITHOUT CARDIOVERSION: SHX5443

## 2020-01-23 SURGERY — ECHOCARDIOGRAM, TRANSESOPHAGEAL
Anesthesia: Monitor Anesthesia Care

## 2020-01-23 MED ORDER — GABAPENTIN 300 MG PO CAPS: 300.0000 mg | ORAL_CAPSULE | Freq: Two times a day (BID) | ORAL | Status: DC | PRN

## 2020-01-23 MED ORDER — PROPOFOL 500 MG/50ML IV EMUL
INTRAVENOUS | Status: DC | PRN
  Administered 2020-01-23: 150 ug/kg/min via INTRAVENOUS

## 2020-01-23 MED ORDER — LISINOPRIL 20 MG PO TABS: 20.0000 mg | ORAL_TABLET | Freq: Every day | ORAL | Status: DC

## 2020-01-23 MED ORDER — LIDOCAINE 2% (20 MG/ML) 5 ML SYRINGE
INTRAMUSCULAR | Status: DC | PRN
  Administered 2020-01-23: 40 mg via INTRAVENOUS

## 2020-01-23 MED ORDER — SODIUM CHLORIDE 0.9 % IV SOLN: INTRAVENOUS | Status: DC

## 2020-01-23 MED ORDER — PROPOFOL 10 MG/ML IV BOLUS
INTRAVENOUS | Status: DC | PRN
  Administered 2020-01-23 (×5): 20 mg via INTRAVENOUS

## 2020-01-23 MED ORDER — LACTATED RINGERS IV SOLN: INTRAVENOUS | Status: DC | PRN

## 2020-01-23 MED ORDER — FLUTICASONE PROPIONATE 50 MCG/ACT NA SUSP: 2.0000 | Freq: Every day | NASAL | Status: DC

## 2020-01-23 NOTE — Anesthesia Preprocedure Evaluation (Signed)
Anesthesia Evaluation  Patient identified by MRN, date of birth, ID band Patient awake    Reviewed: Allergy & Precautions, NPO status , Patient's Chart, lab work & pertinent test results  History of Anesthesia Complications Negative for: history of anesthetic complications  Airway Mallampati: II  TM Distance: >3 FB Neck ROM: Full    Dental   Pulmonary neg pulmonary ROS, former smoker,    Pulmonary exam normal        Cardiovascular hypertension, Pt. on medications Normal cardiovascular exam+ Valvular Problems/Murmurs (aortic valve mass)      Neuro/Psych H/o ruptured cerebral aneurysm w/ SAH s/p coiling negative psych ROS   GI/Hepatic negative GI ROS, (+)     substance abuse  marijuana use, Hepatitis -, B  Endo/Other  negative endocrine ROS  Renal/GU negative Renal ROS  negative genitourinary   Musculoskeletal negative musculoskeletal ROS (+)   Abdominal   Peds  Hematology  (+) HIV,   Anesthesia Other Findings   Echo 12/13/19:  1. There is a small mass attached to the non-coronary cusp of the aortic valve that measures 0.6 cm x 0.5 cm. Suspect this represents calcium as the structure is bright and moves with the leaflet. There is no significant regurgitation or destruction of the valve to suggest endocarditis. Also could represent papillary fibroelastoma. Would recommend a TEE for better characterization. The aortic valve is tricuspid. Aortic valve regurgitation is not visualized. No aortic stenosis is present.  2. Left ventricular ejection fraction, by estimation, is 65 to 70%. The left ventricle has normal function. The left ventricle has no regional wall motion abnormalities. Left ventricular diastolic parameters were normal. The average left ventricular global longitudinal strain is -19.7 %. The global longitudinal strain is normal.  3. Right ventricular systolic function is normal. The right ventricular size is  normal. Tricuspid regurgitation signal is inadequate for assessing PA pressure.  4. The mitral valve is grossly normal. No evidence of mitral valve regurgitation. No evidence of mitral stenosis.   Reproductive/Obstetrics                             Anesthesia Physical Anesthesia Plan  ASA: III  Anesthesia Plan: MAC   Post-op Pain Management:    Induction: Intravenous  PONV Risk Score and Plan: 2 and Propofol infusion, TIVA and Treatment may vary due to age or medical condition  Airway Management Planned: Natural Airway, Nasal Cannula and Simple Face Mask  Additional Equipment: None  Intra-op Plan:   Post-operative Plan:   Informed Consent: I have reviewed the patients History and Physical, chart, labs and discussed the procedure including the risks, benefits and alternatives for the proposed anesthesia with the patient or authorized representative who has indicated his/her understanding and acceptance.       Plan Discussed with:   Anesthesia Plan Comments:         Anesthesia Quick Evaluation

## 2020-01-23 NOTE — Transfer of Care (Signed)
Immediate Anesthesia Transfer of Care Note  Patient: Connie Wade  Procedure(s) Performed: TRANSESOPHAGEAL ECHOCARDIOGRAM (TEE) (N/A )  Patient Location: Endoscopy Unit  Anesthesia Type:MAC  Level of Consciousness: awake, alert  and oriented  Airway & Oxygen Therapy: Patient Spontanous Breathing  Post-op Assessment: Report given to RN, Post -op Vital signs reviewed and stable and Patient moving all extremities  Post vital signs: Reviewed and stable  Last Vitals:  Vitals Value Taken Time  BP 121/65 01/23/20 0815  Temp 37.1 C 01/23/20 0815  Pulse 82 01/23/20 0817  Resp 16 01/23/20 0817  SpO2 100 % 01/23/20 0817  Vitals shown include unvalidated device data.  Last Pain:  Vitals:   01/23/20 0815  TempSrc: Tympanic  PainSc: 0-No pain         Complications: No complications documented.

## 2020-01-23 NOTE — Anesthesia Postprocedure Evaluation (Signed)
Anesthesia Post Note  Patient: Nikesha Swygert Medici  Procedure(s) Performed: TRANSESOPHAGEAL ECHOCARDIOGRAM (TEE) (N/A )     Patient location during evaluation: Endoscopy Anesthesia Type: MAC Level of consciousness: awake and alert Pain management: pain level controlled Vital Signs Assessment: post-procedure vital signs reviewed and stable Respiratory status: spontaneous breathing, nonlabored ventilation and respiratory function stable Cardiovascular status: blood pressure returned to baseline and stable Postop Assessment: no apparent nausea or vomiting Anesthetic complications: no   No complications documented.  Last Vitals:  Vitals:   01/23/20 0825 01/23/20 0835  BP: 139/75 (!) 155/76  Pulse: 84 81  Resp: (!) 9 13  Temp:    SpO2: 100% 100%    Last Pain:  Vitals:   01/23/20 0835  TempSrc:   PainSc: 0-No pain                 Lidia Collum

## 2020-01-23 NOTE — Progress Notes (Signed)
  Echocardiogram 2D Echocardiogram with 3D and color has been performed.  Leta Jungling M 01/23/2020, 8:22 AM

## 2020-01-23 NOTE — Anesthesia Procedure Notes (Signed)
Procedure Name: MAC Date/Time: 01/23/2020 7:52 AM Performed by: Amadeo Garnet, CRNA Pre-anesthesia Checklist: Patient identified, Emergency Drugs available, Suction available and Patient being monitored Patient Re-evaluated:Patient Re-evaluated prior to induction Oxygen Delivery Method: Nasal cannula Preoxygenation: Pre-oxygenation with 100% oxygen Induction Type: IV induction Placement Confirmation: positive ETCO2 Dental Injury: Teeth and Oropharynx as per pre-operative assessment

## 2020-01-23 NOTE — Interval H&P Note (Signed)
History and Physical Interval Note:  01/23/2020 7:47 AM  Connie Wade  has presented today for surgery, with the diagnosis of AORTIC VALVE MASS.  The various methods of treatment have been discussed with the patient and family. After consideration of risks, benefits and other options for treatment, the patient has consented to  Procedure(s): TRANSESOPHAGEAL ECHOCARDIOGRAM (TEE) (N/A) as a surgical intervention.  The patient's history has been reviewed, patient examined, no change in status, stable for surgery.  I have reviewed the patient's chart and labs.  Questions were answered to the patient's satisfaction.     Armanda Magic

## 2020-01-23 NOTE — CV Procedure (Signed)
    PROCEDURE NOTE:  Procedure:  Transesophageal echocardiogram Operator:  Armanda Magic, MD Indications:  AV mass Complications: None  During this procedure the patient is administered a total of Propofol 208 mg to achieve and maintain moderate conscious sedation.  The patient's heart rate, blood pressure, and oxygen saturation are monitored continuously during the procedure by anesthesia.   Results: Normal LV size and function Normal RV size and function Normal RA Normal LA Normal TV with mild TR Normal PV with trivial PR Normal MV with mild MR Trileaflet AV with thickening of the right coronary cusp and Lambl's excrucences.  No vegetation noted.  Normal interatrial septum with no evidence of shunt by colorflow dopper  Grade 1 atherosclerosis of the aortic arch and ascending aorta.  The patient tolerated the procedure well and was transferred back to their room in stable condition.  Signed: Armanda Magic, MD [facility]

## 2020-01-24 ENCOUNTER — Encounter (HOSPITAL_COMMUNITY): Payer: Self-pay | Admitting: Cardiology

## 2020-06-03 ENCOUNTER — Other Ambulatory Visit: Payer: Self-pay

## 2020-06-03 ENCOUNTER — Encounter: Payer: Self-pay | Admitting: Family Medicine

## 2020-06-03 ENCOUNTER — Ambulatory Visit (INDEPENDENT_AMBULATORY_CARE_PROVIDER_SITE_OTHER): Payer: Medicare Other | Admitting: Family Medicine

## 2020-06-03 VITALS — BP 120/60 | HR 86 | Ht 61.0 in | Wt 102.2 lb

## 2020-06-03 DIAGNOSIS — G5691 Unspecified mononeuropathy of right upper limb: Secondary | ICD-10-CM

## 2020-06-03 DIAGNOSIS — M5136 Other intervertebral disc degeneration, lumbar region: Secondary | ICD-10-CM

## 2020-06-03 DIAGNOSIS — R718 Other abnormality of red blood cells: Secondary | ICD-10-CM | POA: Diagnosis not present

## 2020-06-03 DIAGNOSIS — M79641 Pain in right hand: Secondary | ICD-10-CM | POA: Diagnosis not present

## 2020-06-03 DIAGNOSIS — D75839 Thrombocytosis, unspecified: Secondary | ICD-10-CM | POA: Diagnosis not present

## 2020-06-03 MED ORDER — DICLOFENAC SODIUM 1 % EX GEL: 2.0000 g | Freq: Four times a day (QID) | CUTANEOUS | 1 refills | Status: DC

## 2020-06-03 NOTE — Assessment & Plan Note (Addendum)
She seems to have some new right arm pain in the setting of her known right arm neuropathy.  She does not report any trauma and I have a low suspicion for any significant musculoskeletal injury at this time.  No obvious significant injury to the rotator cuff.  For now, we can watch and wait and monitor progression.  She cannot use NSAIDs due to her known cerebral aneurysm.  She was encouraged to try Voltaren gel.  She was encouraged to return to clinic if this did not significantly improve in the next 1-2 weeks.  With regard to the neuropathy of her fingers and arm, she was encouraged to try a wrist splint at night for the next 6 weeks to see if this improves her symptoms at all.  As she has a significant history of neuropathy, it is not obvious that this will improve her symptoms but it does seem to be a harmless intervention that could offer some benefit.

## 2020-06-03 NOTE — Progress Notes (Signed)
SUBJECTIVE:   CHIEF COMPLAINT / HPI:   Right first finger pain Ms. Fenter reports that she has been having pain in the joints on her hand for the past 3 days.  She does not remember any specific trauma.  She notes that she seems to have some swelling in this joint in her right hand compared to her left along with some minor difficulty using her thumb.  She is not able to take NSAIDs has not tried any pain relievers for this issue.  She has a previous medical history notable for gout and has had gout flares in her feet before but never in her hands.  She notes this is significantly more minor compared to a gout flare.  Right arm neuropathy She has had some right arm and right hand neuropathy for several years now which affects her entire right arm and most of her fingertips.  She does note it seems to affect her second, third and fourth fingers more than the others.  In the past day, she is began to experience some significant right arm aching over her entire right arm.  Yesterday, she spent a lot of time with her church performing manual tasks and being much more active than her typical day.  She is suspicious that she may have strained a muscle or simply sore.  PERTINENT  PMH / PSH: Hypertension, HIV (elite responder)  OBJECTIVE:   BP 120/60   Pulse 86   Ht 5\' 1"  (1.549 m)   Wt 102 lb 4 oz (46.4 kg)   SpO2 97%   BMI 19.32 kg/m    General: Alert and cooperative and appears to be in no acute distress Respiratory: Breathing comfortably on room air.  No respiratory distress. Neuro: Cranial nerves grossly intact  Hand: Inspection: No obvious deformity. No erythema or bruising.  Mild swelling noted around the first [facility]. Palpation: Mild tenderness with palpation of the first CMC. ROM: Full ROM of the digits and wrist. Fully able to extend and flex finger. Strength: 5/5 strength in the forearm, wrist and interosseus muscles Neurovascular: NV intact Special tests: negative tinel's at  the carpal tunnel, weakly positive Phalen's test  Right shoulder Inspection: No gross deformity Palpation: Mildly tender to palpation over right lateral deltoid and whole right upper arm. ROM: Full active ROM with right and left ar.  More ginger with the right shoulder.   ASSESSMENT/PLAN:   Neuropathy, arm, right She seems to have some new right arm pain in the setting of her known right arm neuropathy.  She does not report any trauma and I have a low suspicion for any significant musculoskeletal injury at this time.  No obvious significant injury to the rotator cuff.  For now, we can watch and wait and monitor progression.  She cannot use NSAIDs due to her known cerebral aneurysm.  She was encouraged to try Voltaren gel.  She was encouraged to return to clinic if this did not significantly improve in the next 1-2 weeks.  With regard to the neuropathy of her fingers and arm, she was encouraged to try a wrist splint at night for the next 6 weeks to see if this improves her symptoms at all.  As she has a significant history of neuropathy, it is not obvious that this will improve her symptoms but it does seem to be a harmless intervention that could offer some benefit.  Right hand pain The differential for first right CMC pain includes: Arthritis, gout, acute trauma.  Overall, low suspicion for bony abnormality as she denies any significant history of trauma to this area.  This seems much less severe than a gout flare and I do not think there is any need for colchicine at this time.  It is possible she has a little bit of arthritis in this joint which is flared up.  For now, she was encouraged to use topical Voltaren gel.  If this does not improve, it may be helpful to move forward with hand x-rays.  I do not think is necessary at this time due to her lack of trauma and lack of functional deficits.     Matilde Haymaker, MD [place]

## 2020-06-03 NOTE — Assessment & Plan Note (Signed)
The differential for first right CMC pain includes: Arthritis, gout, acute trauma.  Overall, low suspicion for bony abnormality as she denies any significant history of trauma to this area.  This seems much less severe than a gout flare and I do not think there is any need for colchicine at this time.  It is possible she has a little bit of arthritis in this joint which is flared up.  For now, she was encouraged to use topical Voltaren gel.  If this does not improve, it may be helpful to move forward with hand x-rays.  I do not think is necessary at this time due to her lack of trauma and lack of functional deficits.

## 2020-06-03 NOTE — Patient Instructions (Signed)
Thumb pain: Not positive what is causing the irritation in your thumb right now.  It is possible this is related to some mild arthritis but probably not gout.  It is okay for you to put Voltaren gel over that joint which can be helpful.  Shoulder pain: It is possible you have a little bit of a muscular injury to your shoulder.  I think it would be too soon to do any significant shoulder work-up.  The chance of this getting significantly better on its own is very high.  For now, you can use some Voltaren gel to help reduce aching and Tylenol.  Neuropathy: I think it might be worth trying a right wrist splint to see if this helps at all with the numbness in your hand.  Wear it every night for at least 6 weeks.

## 2020-06-04 LAB — FERRITIN: Ferritin: 58 ng/mL (ref 15–150)

## 2020-07-25 ENCOUNTER — Other Ambulatory Visit: Payer: Self-pay

## 2020-07-25 DIAGNOSIS — M5136 Other intervertebral disc degeneration, lumbar region: Secondary | ICD-10-CM

## 2020-07-26 ENCOUNTER — Other Ambulatory Visit: Payer: Self-pay

## 2020-07-26 ENCOUNTER — Ambulatory Visit (INDEPENDENT_AMBULATORY_CARE_PROVIDER_SITE_OTHER): Payer: Medicare Other | Admitting: Family Medicine

## 2020-07-26 VITALS — BP 129/72 | HR 88 | Ht 61.0 in | Wt 102.1 lb

## 2020-07-26 DIAGNOSIS — R300 Dysuria: Secondary | ICD-10-CM

## 2020-07-26 DIAGNOSIS — R21 Rash and other nonspecific skin eruption: Secondary | ICD-10-CM | POA: Diagnosis not present

## 2020-07-26 LAB — POCT URINALYSIS DIP (MANUAL ENTRY)
Bilirubin, UA: NEGATIVE
Blood, UA: NEGATIVE
Glucose, UA: NEGATIVE mg/dL
Leukocytes, UA: NEGATIVE
Nitrite, UA: NEGATIVE
Protein Ur, POC: NEGATIVE mg/dL
Spec Grav, UA: 1.025 (ref 1.010–1.025)
Urobilinogen, UA: 0.2 E.U./dL
pH, UA: 5.5 (ref 5.0–8.0)

## 2020-07-26 MED ORDER — MUPIROCIN CALCIUM 2 % EX CREA: 1.0000 "application " | TOPICAL_CREAM | Freq: Two times a day (BID) | CUTANEOUS | 0 refills | Status: DC

## 2020-07-26 NOTE — Progress Notes (Signed)
   SUBJECTIVE:   CHIEF COMPLAINT / HPI:   Chief Complaint  Patient presents with   skin    Connie Wade is a 65 y.o. female here for to discuss itching. Has bumps on her neck, back and ears. Bumps started on her back. Rash also behind her ear and on her right shoulder.  She thought it was hives. Tried nothing prior to arrival. Does reports her sister brings people in and out of her home. Itching is worse at night.   Complains of mild discomfort when urinating. Denies urinary frequency and urgency. Reports not being sexually active currently. No vaginal discharge.   PERTINENT  PMH / PSH: reviewed and updated as appropriate   OBJECTIVE:   BP 129/72   Pulse 88   Ht 5\' 1"  (1.549 m)   Wt 102 lb 2 oz (46.3 kg)   SpO2 100%   BMI 19.30 kg/m    GEN: well appearing female, in no acute distress  CV: regular rate and rhythm, no murmurs appreciated  RESP: no increased work of breathing, clear to ascultation bilaterally  SKIN: warm, dry, red raised >1 cm lesions on back, papules behind the ears    ASSESSMENT/PLAN:   No problem-specific Assessment & Plan notes found for this encounter.  Rash History and exam concerning for bed bugs. Discussed prevention. Advised drying bedding with high heat. Applying mattress protector and steaming couch. Handout provided.   Dysuria  UA unremarkable. Pt has no concern for STI. Follow up if sx do not resolve.   Lyndee Hensen, DO PGY-2, [facility] 07/26/2020

## 2020-07-26 NOTE — Patient Instructions (Signed)
It was great seeing you today!   I'd like to see you back for any new symptoms. Be sure to wash and dry on high heat. Take benadryl (diphenhydramine) 4-6 hours prn for itching. Apply the anitibiotic ointment to the areas that you have been scratching to prevent a skin infection.   If you have questions or concerns please do not hesitate to call at [phone].  Dr. Rushie [facility]

## 2020-08-05 ENCOUNTER — Other Ambulatory Visit: Payer: Self-pay

## 2020-08-05 MED ORDER — FLUTICASONE PROPIONATE 50 MCG/ACT NA SUSP
2.0000 | Freq: Every day | NASAL | Status: DC
Start: 2020-08-05 — End: 2020-08-08

## 2020-08-08 MED ORDER — FLUTICASONE PROPIONATE 50 MCG/ACT NA SUSP: 2.0000 | Freq: Every day | NASAL | Status: DC

## 2020-08-08 NOTE — Telephone Encounter (Signed)
Medication set to print. Will resend.

## 2020-08-08 NOTE — Addendum Note (Signed)
Addended by: Dorna Bloom on: 08/08/2020 03:54 PM   Modules accepted: Orders

## 2020-08-12 NOTE — Addendum Note (Signed)
Addended by: Talbot Grumbling on: 08/12/2020 01:44 PM   Modules accepted: Orders

## 2020-08-12 NOTE — Telephone Encounter (Signed)
Patient calls nurse line regarding issues with picking up nasal spray. Per chart review, medication was set to "no print". Attempted to resend electronically, however, rx is missing quantity to be dispensed and number of refills.   Forwarding to PCP to resend.   Talbot Grumbling, RN

## 2020-08-13 MED ORDER — FLUTICASONE PROPIONATE 50 MCG/ACT NA SUSP: NASAL | 1 refills | Status: DC

## 2020-08-13 NOTE — Addendum Note (Signed)
Addended by: Francene Castle on: 08/13/2020 05:04 PM   Modules accepted: Orders

## 2020-09-05 ENCOUNTER — Other Ambulatory Visit: Payer: Self-pay | Admitting: Family Medicine

## 2020-10-01 ENCOUNTER — Other Ambulatory Visit: Payer: Self-pay | Admitting: Family Medicine

## 2020-10-09 ENCOUNTER — Other Ambulatory Visit: Payer: Self-pay

## 2020-10-09 MED ORDER — LISINOPRIL 20 MG PO TABS: 20.0000 mg | ORAL_TABLET | Freq: Every day | ORAL | 1 refills | Status: DC

## 2020-10-31 ENCOUNTER — Other Ambulatory Visit: Payer: Self-pay

## 2020-11-01 MED ORDER — LISINOPRIL 20 MG PO TABS: 20.0000 mg | ORAL_TABLET | Freq: Every day | ORAL | 3 refills | Status: DC

## 2020-11-19 ENCOUNTER — Other Ambulatory Visit: Payer: Self-pay | Admitting: Family Medicine

## 2021-01-04 ENCOUNTER — Other Ambulatory Visit: Payer: Self-pay | Admitting: Family Medicine

## 2021-02-17 ENCOUNTER — Telehealth: Payer: Self-pay

## 2021-02-17 NOTE — Telephone Encounter (Signed)
Patient calls nurse line reporting right leg pain. Patient reports the pain is 7/10 above her knee. Patient denies any trauma to the area. Patient denies any swelling, SOB or bruising. Patient reports the pain has been present for ~ 1 week, however today she is having to use a cane to ambulate.   Patient reports she would feel better going to the UC this evening. However, apt scheduled for tomorrow in [facility] in case she can not be seen. Patient to call and cancel if evaluated at [facility].   Red flags discussed.

## 2021-02-18 ENCOUNTER — Ambulatory Visit
Admission: RE | Admit: 2021-02-18 | Discharge: 2021-02-18 | Disposition: A | Discharge status: Home / Self Care | Payer: Medicare Other | Source: Ambulatory Visit | Attending: Family Medicine | Admitting: Family Medicine

## 2021-02-18 ENCOUNTER — Other Ambulatory Visit: Payer: Self-pay

## 2021-02-18 ENCOUNTER — Ambulatory Visit (INDEPENDENT_AMBULATORY_CARE_PROVIDER_SITE_OTHER): Payer: Medicare Other | Admitting: Family Medicine

## 2021-02-18 VITALS — BP 130/74 | HR 91 | Ht 61.0 in | Wt 100.2 lb

## 2021-02-18 DIAGNOSIS — M25551 Pain in right hip: Secondary | ICD-10-CM

## 2021-02-18 DIAGNOSIS — M1611 Unilateral primary osteoarthritis, right hip: Secondary | ICD-10-CM | POA: Diagnosis not present

## 2021-02-18 DIAGNOSIS — M5136 Other intervertebral disc degeneration, lumbar region: Secondary | ICD-10-CM | POA: Diagnosis not present

## 2021-02-18 MED ORDER — GABAPENTIN 300 MG PO CAPS: 300.0000 mg | ORAL_CAPSULE | Freq: Two times a day (BID) | ORAL | 2 refills | Status: AC | PRN

## 2021-02-18 MED ORDER — ACETAMINOPHEN 500 MG PO TABS: 1000.0000 mg | ORAL_TABLET | Freq: Three times a day (TID) | ORAL | 0 refills | Status: AC | PRN

## 2021-02-18 NOTE — Patient Instructions (Signed)
It was wonderful to meet you today. Thank you for allowing me to be a part of your care. Below is a short summary of what we discussed at your visit today:  Right hip pain -Please go to [facility] imaging for hip x-rays - I have sent a prescription for high-dose Tylenol.  Please make sure to take no more than 3 times a day. -I have referred you to physical therapy, which can greatly help with osteoarthritis of the hip joint - Other things to try include ice packs, heat, tai chi, gentle stretching, range of motion exercises  [facility] [address], [place], [place] [address] [phone] Mon-Fri 8-5      Please bring all of your medications to every appointment!  If you have any questions or concerns, please do not hesitate to contact us via phone or MyChart message.   Ezequiel Essex, MD

## 2021-02-18 NOTE — Progress Notes (Signed)
° ° °  SUBJECTIVE:   CHIEF COMPLAINT / HPI:   Right hip and leg pain - intermittent last 3-4 months - much worse over the last week, yesterday was the worst - now needing to use a cane - pain so bad she was scared to drive to UC last night - has been taking tylenol q6 over the last couple days without relief - more so the hip than the knee - hip hurts less with right leg propped - numbness over lateral right thigh - sharp pain in groin area - last DEXA 02/27/2019 showed normal bone density - last hip XR of bilateral hips and pelvis 08/22/2016 showed normal joints  PERTINENT  PMH / PSH:  Patient Active Problem List   Diagnosis Date Noted   Right hand pain 06/03/2020   Aortic valve mass    Neuropathy, arm, right 69/67/8938   Systolic murmur 11/11/5100   Right hip pain 05/12/2016   Subarachnoid hemorrhage due to ruptured aneurysm ([place]) 12/04/2014   Tobacco abuse 09/19/2012   HYPERTENSION, BENIGN ESSENTIAL 04/26/2007   Depression 03/17/2007   Degenerative disc disease, lumbar 03/17/2007   Human immunodeficiency virus (HIV) disease ([place]) 03/25/2006   HEPATITIS B 03/25/2006   UTERINE FIBROID 03/25/2006   HLD (hyperlipidemia) 03/25/2006   Allergic rhinitis 03/25/2006     OBJECTIVE:   BP 130/74    Pulse 91    Ht 5\' 1"  (1.549 m)    Wt 100 lb 3.2 oz (45.5 kg)    SpO2 98%    BMI 18.93 kg/m    PHQ-9:  Depression screen [facility] 2/9 02/18/2021 07/26/2020 06/03/2020  Decreased Interest 1 0 3  Down, Depressed, Hopeless 0 3 3  PHQ - 2 Score 1 3 6   Altered sleeping 0 1 2  Tired, decreased energy 0 2 3  Change in appetite 3 0 3  Feeling bad or failure about yourself  1 0 0  Trouble concentrating 0 0 1  Moving slowly or fidgety/restless 0 0 0  Suicidal thoughts 0 0 0  PHQ-9 Score 5 6 15   Difficult doing work/chores Not difficult at all - Not difficult at all  Some recent data might be hidden     GAD-7: No flowsheet data found.   Physical Exam General: Awake, alert,  oriented Cardiovascular: Regular rate and rhythm, S1 and S2 present, 3/5 systolic murmur auscultated Respiratory: Lung fields clear to auscultation bilaterally Extremities: Mild TTP over right greater trochanter, right IT band without tenderness, bilateral knees intact/stable/nontender, patient cannot raise right leg due to pain, left leg raise strength 5/5 against pressure, provider manipulation of leg elicits sharp right groin pain when elevated above 30 degrees  ASSESSMENT/PLAN:   Right hip pain Chronic with acute worsening.  Suspect right hip OA.  Will obtain bilateral hip x-rays.  Trial of Tylenol, as she cannot have NSAIDs due to history of brain aneurysm.  Also restarted gabapentin for trial, although this will not likely help for bone pain. Referred to PT.  See AVS for more     Ezequiel Essex, MD [place]

## 2021-02-18 NOTE — Assessment & Plan Note (Addendum)
Chronic with acute worsening.  Suspect right hip OA.  Will obtain bilateral hip x-rays.  Trial of Tylenol, as she cannot have NSAIDs due to history of brain aneurysm.  Also restarted gabapentin for trial, although this will not likely help for bone pain. Referred to PT.  See AVS for more

## 2021-02-20 ENCOUNTER — Telehealth: Payer: Self-pay | Admitting: Family Medicine

## 2021-02-20 DIAGNOSIS — M25551 Pain in right hip: Secondary | ICD-10-CM

## 2021-02-20 MED ORDER — DICLOFENAC EPOLAMINE 1.3 % EX PTCH: 1.0000 | MEDICATED_PATCH | Freq: Two times a day (BID) | CUTANEOUS | 1 refills | Status: DC

## 2021-02-20 NOTE — Telephone Encounter (Signed)
Called patient to discuss hip XR findings of minimal bilateral hip OA. Limited on use of NSAIDs due to history of aneurysm. At Tuesday appointment, was prescribed tylenol and gabapentin, referred to PT.   Patient reports doing moderately better with the tylenol. Gabapentin not helping today so far.   Today discussed possibility of topical diclofenac trial (although patient previously reported she does not like this due to the consistency and stickiness). Patient is amenable to trying this, will send script for diclofenac patch to pharmacy.  Precepted with Dr. Owens Shark, who recommends 4-6 weeks of conservative measures prior to referral to orthopedics. Discussed with patient, she voiced understanding.   All questions answered.   Ezequiel Essex, MD

## 2021-02-20 NOTE — Telephone Encounter (Signed)
Erroneous note.  Ezequiel Essex, MD

## 2021-03-08 NOTE — Therapy (Signed)
OUTPATIENT PHYSICAL THERAPY LOWER EXTREMITY EVALUATION   Patient Name: Connie Wade MRN: [id number] DOB:07/31/1955, 66 y.o., female Today's Date: 03/11/2021   PT End of Session - 03/11/21 0954     Visit Number 1    Number of Visits 17    Date for PT Re-Evaluation 05/06/21    Authorization Type UHC MCR    Authorization Time Period FOTO v6, v10, KX mod at v15    Progress Note Due on Visit 10    PT Start Time 0910    PT Stop Time 0955    PT Time Calculation (min) 45 min    Activity Tolerance Patient tolerated treatment well    Behavior During Therapy [facility] for tasks assessed/performed             Past Medical History:  Diagnosis Date   ANEMIA, OTHER, UNSPECIFIED 03/25/2006   Bright red blood per rectum 11/30/2019   DEPRESSION 03/17/2007   Enteritis 07/28/2019   Gout 04/27/2016   HEPATITIS B 03/25/2006   HIV 03/25/2006   since 2000   HYDRADENITIS 03/25/2006   HYPERLIPIDEMIA 03/25/2006   HYPERTENSION, BENIGN ESSENTIAL 04/26/2007   MENOPAUSAL SYNDROME 03/25/2006   Qualifier: Diagnosis of  By: Damita Dunnings MD, [place]     Neck pain 07/13/2018   TOBACCO DEPENDENCE 03/25/2006   Tubular adenoma of colon 06/23/2011   Benign, no high grade dysplasia 06/16/11 Followed by [name] Dr. Watt Climes Repeat in 5 years (due 2018)   Tubular adenoma of colon 06/23/2011   Benign, no high grade dysplasia 06/16/11 Followed by [name] Dr. Watt Climes Repeat in 5 years (due 2018)   UTERINE FIBROID 03/25/2006   Past Surgical History:  Procedure Laterality Date   BREAST LUMPECTOMY Bilateral    coil     anuerysm   IR GENERIC HISTORICAL  07/26/2015   IR ANGIO INTRA EXTRACRAN SEL INTERNAL CAROTID BILAT MOD SED 07/26/2015 Consuella Lose, MD [facility]   IR GENERIC HISTORICAL  07/26/2015   IR ANGIO VERTEBRAL SEL VERTEBRAL BILAT MOD SED 07/26/2015 Consuella Lose, MD [facility]   OVARIAN CYST REMOVAL  2001   RADIOLOGY WITH ANESTHESIA N/A 12/05/2014   Procedure: RADIOLOGY WITH ANESTHESIA-COING FOR SUBARACHNOID  HEMORRHAGE;  Surgeon: Consuella Lose, MD;  Location: [facility];  Service: Radiology;  Laterality: N/A;   TEE WITHOUT CARDIOVERSION N/A 01/23/2020   Procedure: TRANSESOPHAGEAL ECHOCARDIOGRAM (TEE);  Surgeon: Sueanne Margarita, MD;  Location: [facility] ENDOSCOPY;  Service: Cardiovascular;  Laterality: N/A;   Patient Active Problem List   Diagnosis Date Noted   Right hand pain 06/03/2020   Aortic valve mass    Neuropathy, arm, right 18/29/9371   Systolic murmur 69/67/8938   Right hip pain 05/12/2016   Subarachnoid hemorrhage due to ruptured aneurysm ([place]) 12/04/2014   Tobacco abuse 09/19/2012   HYPERTENSION, BENIGN ESSENTIAL 04/26/2007   Depression 03/17/2007   Degenerative disc disease, lumbar 03/17/2007   Human immunodeficiency virus (HIV) disease ([place]) 03/25/2006   HEPATITIS B 03/25/2006   UTERINE FIBROID 03/25/2006   HLD (hyperlipidemia) 03/25/2006   Allergic rhinitis 03/25/2006    PCP: Rise Patience, DO  REFERRING PROVIDER: Martyn Malay, MD  REFERRING DIAG: [phone] (ICD-10-CM) - Right hip pain  THERAPY DIAG:  Pain in right hip  Muscle weakness (generalized)  Difficulty in walking, not elsewhere classified  ONSET DATE: 1 year ago  SUBJECTIVE:   SUBJECTIVE STATEMENT: Pt reports primary c/o chronic anterior Rt hip pain of insidious onset beginning about 1 year ago. She reports that occasionally, the pain gets really  bad, requiring her to walk with a SPC. She also reports hx of chronic LBP lasting for years which may or may not be associated with her hip pain according to the pt. She states that her hip is very stiff in the mornings when getting out of bed, adding that this lasts for about 15-30 minutes. She denies any clicking/ popping in her hip. The pt reports that sometimes she also has stiffness in her Rt knee, requiring movement to "loosen up." Pt denies any unexplained weight change, changes in bowel/ bladder function, nausea/ vomiting, N/T, saddle anesthesia, or unrelenting  night pain. However, she reports that sometimes after a bad day, her pain can wake her at night. Current pain is 0/10, worst pain is is 7-8/10. Aggravating factors include stress, prolonged sitting> 30 minutes, standing from seated position. Easing factors include walking, Tylenol.  PERTINENT HISTORY: Hx of HTN, depression, HIV  PAIN:  Are you having pain? No NPRS scale: 0/10 Pain location: Rt anterior hip PAIN TYPE: aching and sharp Pain description: intermittent  Aggravating factors: stress, prolonged sitting> 30 minutes, standing from seated position Relieving factors: walking, Tylenol  PRECAUTIONS: None  WEIGHT BEARING RESTRICTIONS No  FALLS:  Has patient fallen in last 6 months? 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  PATIENT SURVEYS:  FOTO 71%, predicted 73% in 10 visits  COGNITION:  Overall cognitive status: Within functional limits for tasks assessed     SENSATION:  Light touch: Appears intact  MUSCLE LENGTH: Hamstrings: WNL BIL Thomas test: Moderate limitation BIL  POSTURE:  WNL  PALPATION: TTP at Rt greater trochanter  LE AROM/PROM:  A/PROM Right 03/11/2021 Left 03/11/2021  Hip flexion 104/138p! 110/135  Hip extension 10/15 15/20  Hip abduction 40/55p! 45/60  Hip adduction 18/25p! 25/30  Hip internal rotation 25/40 40/50  Hip external rotation  50/60p! 35/40   (Blank rows = not tested)  LE MMT:  MMT Right 03/11/2021 Left 03/11/2021  Hip flexion 3+/5p! 4/5  Hip extension 3-/5 3/5  Hip abduction 3+/5 3+/5  Hip internal rotation 4/5 5/5  Hip external rotation 4/5 5/5  Knee flexion 5/5 5/5  Knee extension 4/5 5/5   (Blank rows = not tested)  LOWER EXTREMITY SPECIAL TESTS:  FABER: (+) on Rt FADDIR: (+) on Rt Hip Scour: (-) on Rt   FUNCTIONAL TESTS:  Squat: 70% with minimal pain 5xSTS: 18 seconds, increased pain  GAIT: Distance walked: 20 ft Assistive device utilized: None Level of assistance: Complete Independence Comments: Decreased stride length BIL, otherwise WNL    TODAY'S TREATMENT: [facility] Adult PT Treatment:                                                DATE: 03/11/2021 Issued HEP    PATIENT EDUCATION:  Education details: Pt educated on possible underlying pathophysiology behind  her pain presentation, POC, prognosis, HEP Person educated: Patient Education method: Explanation, Demonstration, and Handouts Education comprehension: verbalized understanding and returned demonstration   HOME EXERCISE PROGRAM: Access Code: [email or web address] URL: [email or web address] Date: 03/11/2021 Prepared by: Vanessa Amo  Exercises: Supine Bridge - 1 x daily - 7 x weekly - 3 sets - 10 reps - 3-sec hold Sidelying Hip Abduction - 1 x daily - 7 x weekly - 2 sets - 10 reps - 3-sec hold Modified Thomas Stretch - 1 x daily - 7 x weekly - 1 sets - 2-min hold Mini Squat with Counter Support - 1 x daily - 7 x weekly - 3 sets - 10 reps Walking Program  ASSESSMENT:  CLINICAL IMPRESSION: Patient is a 66 y.o. F who was seen today for physical therapy evaluation and treatment for chronic Rt hip pain. Upon assessment, the pt's primary impairments include limited and painful Rt hip AROM in all planes compared to Lt, weak global BIL Rt>Lt hip MMT, TTP to Rt greater trochanter, moderately limited BIL hip flexor  extensibility, painful and limited squat, and increased fall risk as indicated by diminished 5xSTS. Ruling up Rt hip osteoarthritic pain symptoms due to capsular limitation in ROM and MMT, report of morning stiffness lasting <30 minutes, report of anterior hip pain, and recent radiographic evidence of Rt hip OA. Cannot rule out greater trochanteric pain syndrome (GTPS) due to TTP upon palpation of greater trochanter, although further testing would be needed to rule in this as a contributing factor to the pt's pain. These impairments are limiting patient from cleaning, community activity, driving, laundry, and yard work. Personal factors including 3+ comorbidities: HTN, HIV, and depression  are also affecting patient's functional outcome. Patient will benefit from skilled PT to address above impairments and improve overall function.  REHAB POTENTIAL: Good  CLINICAL DECISION MAKING: Stable/uncomplicated  EVALUATION COMPLEXITY: Low   GOALS: Goals reviewed with patient? Yes  SHORT TERM GOALS:  STG Name Target Date Goal status  1 Pt will report understanding and adherence to her HEP in order to promote independence in the management of her primary impairments. Baseline: HEP provided at eval 04/08/2021 INITIAL   LONG TERM GOALS:   LTG Name Target Date Goal status  1 Pt will demonstrate ability to perform 10 full-depth weight-bearing squats with 0-2/10 pain in order to do the laundry and dishes at home without limitation. 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 Baseline: Increased pain after 30 minutes of sitting 05/06/2021 INITIAL  3 Pt will achieve Lt hip IR AROM of 40 degrees in order to cross her legs to put shoes and socks on without limitation. Baseline: 25 degrees 05/06/2021 INITIAL  4 Pt will achieve 5xSTS </= 12 seconds  in order to promote safe ambulation when grocery shopping. Baseline: 18 seconds 05/06/2021 INITIAL  5 Pt will achieve BIL global hip strength of 4+/5 in order to progress her independent strengthening regimen with less limitation. Baseline: See MMT chart 05/06/2021 INITIAL   PLAN: PT FREQUENCY: 1-2x/week  PT DURATION: 8 weeks  PLANNED INTERVENTIONS: Therapeutic exercises, Therapeutic activity, Neuro Muscular re-education, Balance training, Gait training, Patient/Family education, Joint mobilization, Stair training, Dry Needling, Spinal mobilization, Cryotherapy, Moist heat, Taping, Vasopneumatic device, Traction, and Manual therapy  PLAN FOR NEXT SESSION: Progress closed-chain hip strengthening/ mobility; assess IT band, spinal passive accessories  as able   Vanessa [place], PT, DPT 03/11/21 11:11 AM

## 2021-03-11 ENCOUNTER — Other Ambulatory Visit: Payer: Self-pay

## 2021-03-11 ENCOUNTER — Ambulatory Visit: Payer: Medicare Other | Attending: Family Medicine

## 2021-03-11 DIAGNOSIS — M6281 Muscle weakness (generalized): Secondary | ICD-10-CM

## 2021-03-11 DIAGNOSIS — M25551 Pain in right hip: Secondary | ICD-10-CM | POA: Diagnosis not present

## 2021-03-11 DIAGNOSIS — R262 Difficulty in walking, not elsewhere classified: Secondary | ICD-10-CM | POA: Diagnosis not present

## 2021-03-18 ENCOUNTER — Other Ambulatory Visit: Payer: Self-pay

## 2021-03-18 ENCOUNTER — Encounter: Payer: Self-pay | Admitting: Physical Therapy

## 2021-03-18 ENCOUNTER — Ambulatory Visit: Payer: Medicare Other | Admitting: Physical Therapy

## 2021-03-18 DIAGNOSIS — M25551 Pain in right hip: Secondary | ICD-10-CM | POA: Diagnosis not present

## 2021-03-18 DIAGNOSIS — R262 Difficulty in walking, not elsewhere classified: Secondary | ICD-10-CM | POA: Diagnosis not present

## 2021-03-18 DIAGNOSIS — M6281 Muscle weakness (generalized): Secondary | ICD-10-CM

## 2021-03-18 NOTE — Therapy (Signed)
OUTPATIENT PHYSICAL THERAPY TREATMENT NOTE   Patient Name: Connie [facility] MRN: [id number] DOB:[birth year], 66 y.o., female Today's Date: 03/18/2021  PCP: Connie Patience, DO REFERRING PROVIDER: Rise Patience, DO   PT End of Session - 03/18/21 1040     Visit Number 2    Number of Visits 17    Date for PT Re-Evaluation 05/06/21    Authorization Type UHC MCR    Authorization Time Period FOTO v6, v10, KX mod at v15    Progress Note Due on Visit 10    PT Start Time 1000    PT Stop Time 1040    PT Time Calculation (min) 40 min    Activity Tolerance Patient tolerated treatment well    Behavior During Therapy WFL for tasks assessed/performed             Past Medical History:  Diagnosis Date   ANEMIA, OTHER, UNSPECIFIED 03/25/2006   Bright red blood per rectum 11/30/2019   DEPRESSION 03/17/2007   Enteritis 07/28/2019   Gout 04/27/2016   HEPATITIS B 03/25/2006   HIV 03/25/2006   since 2000   HYDRADENITIS 03/25/2006   HYPERLIPIDEMIA 03/25/2006   HYPERTENSION, BENIGN ESSENTIAL 04/26/2007   MENOPAUSAL SYNDROME 03/25/2006   Qualifier: Diagnosis of  By: Connie Dunnings MD, [facility]     Neck pain 07/13/2018   TOBACCO DEPENDENCE 03/25/2006   Tubular adenoma of colon 06/23/2011   Benign, no high grade dysplasia 06/16/11 Followed by [facility] Dr. Watt [facility] Repeat in 5 years (due 2018)   Tubular adenoma of colon 06/23/2011   Benign, no high grade dysplasia 06/16/11 Followed by [facility] Dr. Watt [facility] Repeat in 5 years (due 2018)   UTERINE FIBROID 03/25/2006   Past Surgical History:  Procedure Laterality Date   BREAST LUMPECTOMY Bilateral    coil     anuerysm   IR GENERIC HISTORICAL  07/26/2015   IR ANGIO INTRA EXTRACRAN SEL INTERNAL CAROTID BILAT MOD SED 07/26/2015 Connie Lose, MD [facility]   IR GENERIC HISTORICAL  07/26/2015   IR ANGIO VERTEBRAL SEL VERTEBRAL BILAT MOD SED 07/26/2015 Connie Lose, [facility]   OVARIAN CYST REMOVAL  2001   RADIOLOGY WITH ANESTHESIA N/A 12/05/2014    Procedure: RADIOLOGY WITH ANESTHESIA-COING FOR SUBARACHNOID HEMORRHAGE;  Surgeon: Connie Lose, MD;  Location: [facility];  Service: Radiology;  Laterality: N/A;   TEE WITHOUT CARDIOVERSION N/A 01/23/2020   Procedure: TRANSESOPHAGEAL ECHOCARDIOGRAM (TEE);  Surgeon: Connie Margarita, MD;  Location: [facility] ENDOSCOPY;  Service: Cardiovascular;  Laterality: N/A;   Patient Active Problem List   Diagnosis Date Noted   Right hand pain 06/03/2020   Aortic valve mass    Neuropathy, arm, right 57/32/2025   Systolic murmur 42/70/6237   Right hip pain 05/12/2016   Subarachnoid hemorrhage due to ruptured aneurysm ([facility]) 12/04/2014   Tobacco abuse 09/19/2012   HYPERTENSION, BENIGN ESSENTIAL 04/26/2007   Depression 03/17/2007   Degenerative disc disease, lumbar 03/17/2007   Human immunodeficiency virus (HIV) disease ([place]) 03/25/2006   HEPATITIS B 03/25/2006   UTERINE FIBROID 03/25/2006   HLD (hyperlipidemia) 03/25/2006   Allergic rhinitis 03/25/2006    REFERRING DIAG: M25.551 (ICD-10-CM) - Right hip pain  THERAPY DIAG:  Pain in right hip  Muscle weakness (generalized)  Difficulty in walking, not elsewhere classified  PERTINENT HISTORY: Hx of HTN, depression, HIV  PRECAUTIONS/RESTRICTIONS:   none  SUBJECTIVE:  Pt reports that her R hip pain is improved.  She is having some back pain today.  PAIN:  Are you having pain?  Yes NPRS scale: 2/10 Pain location: Rt anterior hip PAIN TYPE: aching and sharp Pain description: intermittent  Aggravating factors: stress, prolonged sitting> 30 minutes, standing from seated position Relieving factors: walking, Tylenol  OBJECTIVE:  LE AROM/PROM:   A/PROM Right 03/11/2021 Left 03/11/2021  Hip flexion 104/138p! 110/135  Hip extension 10/15 15/20  Hip abduction 40/55p! 45/60  Hip adduction 18/25p! 25/30  Hip internal rotation 25/40 40/50  Hip external rotation 50/60p! 35/40   (Blank rows = not tested)   LE MMT:   MMT Right 03/11/2021  Left 03/11/2021  Hip flexion 3+/5p! 4/5  Hip extension 3-/5 3/5  Hip abduction 3+/5 3+/5  Hip internal rotation 4/5 5/5  Hip external rotation 4/5 5/5  Knee flexion 5/5 5/5  Knee extension 4/5 5/5   (Blank rows = not tested)   TREATMENT 03/18/2021:  Therapeutic Exercise: - nu-step L5 61m while taking subjective and planning session with patient - LTR - 20x - bridge - 2x10 5'' hold - Hip IR/adduction ball squeeze - 2x10 - 3'' - ER clam shell - alternating - RTB - 2x20 - Modified thomas with contralateral SKTC (R hip flexor) - stopped d/t pain - S/L hip abd - 2x10  - Alternating SLR, pt in supine, 2x20  Manual Therapy: - LA R hip distraction - Lateral glide of R hip, pt in supine with gait belt  HOME EXERCISE PROGRAM: Access Code: [email or web address] URL: [email or web address] Date: 03/11/2021 Prepared by: [facility]   Exercises: Supine Bridge - 1 x daily - 7 x weekly - 3 sets - 10 reps - 3-sec hold Sidelying Hip Abduction - 1 x daily - 7 x weekly - 2 sets - 10 reps - 3-sec hold Modified Thomas Stretch - 1 x daily - 7 x weekly - 1 sets - 2-min hold Mini Squat with Counter Support - 1 x daily - 7 x weekly - 3 sets - 10 reps Walking Program   ASSESSMENT:   CLINICAL IMPRESSION: Connie [facility] is progressing well with therapy.  Pt reports significant pain reduction following therapy (>/= 2 pts NPRS).  Today we concentrated on core strengthening and hip strengthening.  Pt responded well to manual therapy with significant pain reduction.  She fatigues rapidly with mat exercises, but demonstrates good form throughout with min cuing.  Pt will continue to benefit from skilled physical therapy to address remaining deficits and achieve listed goals.  Continue per POC.     GOALS: Goals reviewed with patient? Yes   SHORT TERM GOALS:   STG Name Target Date Goal status  1 Pt will report understanding and adherence to her HEP in order to promote independence in the management of  her primary impairments. Baseline: HEP provided at eval 04/08/2021 INITIAL    LONG TERM GOALS:    LTG Name Target Date Goal status  1 Pt will demonstrate ability to perform 10 full-depth weight-bearing squats with 0-2/10 pain in order to do the laundry and dishes at home without limitation. 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 Baseline: Increased pain after 30 minutes of sitting 05/06/2021 INITIAL  3 Pt will achieve Lt hip IR AROM of 40 degrees in order to cross her legs to put shoes and socks on without limitation. Baseline: 25 degrees 05/06/2021 INITIAL  4 Pt will achieve 5xSTS </= 12 seconds in order to  promote safe ambulation when grocery shopping. Baseline: 18 seconds 05/06/2021 INITIAL  5 Pt will achieve BIL global hip strength of 4+/5 in order to progress her independent strengthening regimen with less limitation. Baseline: See MMT chart 05/06/2021 INITIAL    PLAN: PT FREQUENCY: 1-2x/week   PT DURATION: 8 weeks   PLANNED INTERVENTIONS: Therapeutic exercises, Therapeutic activity, Neuro Muscular re-education, Balance training, Gait training, Patient/Family education, Joint mobilization, Stair training, Dry Needling, Spinal mobilization, Cryotherapy, Moist heat, Taping, Vasopneumatic device, Traction, and Manual therapy   PLAN FOR NEXT SESSION: Progress closed-chain hip strengthening/ mobility; assess IT band, spinal passive accessories as able    Mathis Dad PT 03/18/2021, 10:41 AM

## 2021-03-22 NOTE — Therapy (Signed)
OUTPATIENT PHYSICAL THERAPY TREATMENT NOTE   Patient Name: Connie [facility] MRN: [id number] DOB:1955/07/05, 66 y.o., female Today's Date: 03/25/2021  PCP: Rise Patience, DO REFERRING PROVIDER: Rise Patience, DO   PT End of Session - 03/25/21 1049     Visit Number 3    Number of Visits 17    Date for PT Re-Evaluation 05/06/21    Authorization Type UHC MCR    Authorization Time Period FOTO v6, v10, KX mod at v15    Progress Note Due on Visit 10    PT Start Time 1048    PT Stop Time 1130    PT Time Calculation (min) 42 min    Activity Tolerance Patient tolerated treatment well    Behavior During Therapy WFL for tasks assessed/performed              Past Medical History:  Diagnosis Date   ANEMIA, OTHER, UNSPECIFIED 03/25/2006   Bright red blood per rectum 11/30/2019   DEPRESSION 03/17/2007   Enteritis 07/28/2019   Gout 04/27/2016   HEPATITIS B 03/25/2006   HIV 03/25/2006   since 2000   HYDRADENITIS 03/25/2006   HYPERLIPIDEMIA 03/25/2006   HYPERTENSION, BENIGN ESSENTIAL 04/26/2007   MENOPAUSAL SYNDROME 03/25/2006   Qualifier: Diagnosis of  By: Damita Dunnings MD, [place]     Neck pain 07/13/2018   TOBACCO DEPENDENCE 03/25/2006   Tubular adenoma of colon 06/23/2011   Benign, no high grade dysplasia 06/16/11 Followed by [name] Dr. Watt [facility] Repeat in 5 years (due 2018)   Tubular adenoma of colon 06/23/2011   Benign, no high grade dysplasia 06/16/11 Followed by [name] Dr. Watt Climes Repeat in 5 years (due 2018)   UTERINE FIBROID 03/25/2006   Past Surgical History:  Procedure Laterality Date   BREAST LUMPECTOMY Bilateral    coil     anuerysm   IR GENERIC HISTORICAL  07/26/2015   IR ANGIO INTRA EXTRACRAN SEL INTERNAL CAROTID BILAT MOD SED 07/26/2015 Consuella Lose, MD [facility]   IR GENERIC HISTORICAL  07/26/2015   IR ANGIO VERTEBRAL SEL VERTEBRAL BILAT MOD SED 07/26/2015 Consuella Lose, [facility]   OVARIAN CYST REMOVAL  2001   RADIOLOGY WITH ANESTHESIA N/A 12/05/2014    Procedure: RADIOLOGY WITH ANESTHESIA-COING FOR SUBARACHNOID HEMORRHAGE;  Surgeon: Consuella Lose, MD;  Location: [facility];  Service: Radiology;  Laterality: N/A;   TEE WITHOUT CARDIOVERSION N/A 01/23/2020   Procedure: TRANSESOPHAGEAL ECHOCARDIOGRAM (TEE);  Surgeon: Sueanne Margarita, MD;  Location: [facility] ENDOSCOPY;  Service: Cardiovascular;  Laterality: N/A;   Patient Active Problem List   Diagnosis Date Noted   Right hand pain 06/03/2020   Aortic valve mass    Neuropathy, arm, right 76/28/3151   Systolic murmur 76/16/0737   Right hip pain 05/12/2016   Subarachnoid hemorrhage due to ruptured aneurysm ([place]) 12/04/2014   Tobacco abuse 09/19/2012   HYPERTENSION, BENIGN ESSENTIAL 04/26/2007   Depression 03/17/2007   Degenerative disc disease, lumbar 03/17/2007   Human immunodeficiency virus (HIV) disease ([place]) 03/25/2006   HEPATITIS B 03/25/2006   UTERINE FIBROID 03/25/2006   HLD (hyperlipidemia) 03/25/2006   Allergic rhinitis 03/25/2006    REFERRING DIAG: M25.551 (ICD-10-CM) - Right hip pain  THERAPY DIAG:  Pain in right hip  Muscle weakness (generalized)  Difficulty in walking, not elsewhere classified  PERTINENT HISTORY: Hx of HTN, depression, HIV  PRECAUTIONS/RESTRICTIONS:   none  SUBJECTIVE:  Pt reports 0/10 Rt hip pain today, although she reports increased pain yesterday after prolonged walking on Sunday. She reports doing her HEP  roughly every other day.  PAIN:  Are you having pain? Yes NPRS scale: 0/10 Pain location: Rt anterior hip PAIN TYPE: aching and sharp Pain description: intermittent  Aggravating factors: stress, prolonged sitting> 30 minutes, standing from seated position Relieving factors: walking, Tylenol  OBJECTIVE:  LE AROM/PROM:   A/PROM Right 03/11/2021 Left 03/11/2021  Hip flexion 104/138p! 110/135  Hip extension 10/15 15/20  Hip abduction 40/55p! 45/60  Hip adduction 18/25p! 25/30  Hip internal rotation 25/40 40/50  Hip external rotation  50/60p! 35/40   (Blank rows = not tested)   LE MMT:   MMT Right 03/11/2021 Left 03/11/2021 Right 03/25/2021 Left 03/25/2021  Hip flexion 3+/5p! 4/5 4+/5 4+/5  Hip extension 3-/5 3/5 3/5 3+/5  Hip abduction 3+/5 3+/5 4/5 4/5  Hip internal rotation 4/5 5/5 4+/5 5/5  Hip external rotation 4/5 5/5 4+/5 5/5  Knee flexion 5/5 5/5    Knee extension 4/5 5/5 4+/5    (Blank rows = not tested)   [facility] Adult PT Treatment:                                                DATE: 03/25/2021 Therapeutic Exercise: Standing Cybex hip abduction with 30# cable 2x10 BIL Standing Cybex hip extension with 30# cable 2x10 BIL Kickstand stance Pallof press with 3# cable into hip ER and IR 2x10 with 5-sec hold BIL each Dead lift with two 10# kettlebells 3x8 Standing IT band stretch with overhead reach 2x1 min on Rt Manual Therapy: Supine lateral Rt hip distraction with mobilization belt x4 minutes Supine Rt hip long-axis distraction x4 minutes Neuromuscular re-ed: N/A Therapeutic Activity: N/A Modalities: N/A Self Care: N/A    TREATMENT 03/25/2021:  Therapeutic Exercise: - nu-step L5 26m while taking subjective and planning session with patient - LTR - 20x - bridge - 2x10 5'' hold - Hip IR/adduction ball squeeze - 2x10 - 3'' - ER clam shell - alternating - RTB - 2x20 - Modified thomas with contralateral SKTC (R hip flexor) - stopped d/t pain - S/L hip abd - 2x10  - Alternating SLR, pt in supine, 2x20  Manual Therapy: - LA R hip distraction - Lateral glide of R hip, pt in supine with gait belt  HOME EXERCISE PROGRAM: Access Code: [email or web address] URL: [email or web address] Date: 03/11/2021 Prepared by: Vanessa [place]   Exercises: Supine Bridge - 1 x daily - 7 x weekly - 3 sets - 10 reps - 3-sec hold Sidelying Hip Abduction - 1 x daily - 7 x weekly - 2 sets - 10 reps - 3-sec hold Modified Thomas Stretch - 1 x daily - 7 x weekly - 1 sets - 2-min hold Mini Squat with Counter Support  - 1 x daily - 7 x weekly - 3 sets - 10 reps Walking Program   ASSESSMENT:   CLINICAL IMPRESSION: Upon re-assessment of strength measures, the pt is making steady progress toward her rehab goals. She again reports a therapeutic response to Rt hip distraction techniques prior to LE strengthening. She tolerated all progressed hip strengthening exercises today well, demonstrating good form and no increase in pain. She also reports a therapeutic response to standing IT band stretch. She will continue to benefit from skilled PT to address her primary impairments and return to her prior level of function with less limitation.     GOALS: Goals reviewed with patient?  Yes   SHORT TERM GOALS:   STG Name Target Date Goal status  1 Pt will report understanding and adherence to her HEP in order to promote independence in the management of her primary impairments. Baseline: HEP provided at eval 03/25/2021: Pt reports regular adherence to her HEP 04/08/2021 ACHIEVED    LONG TERM GOALS:    LTG Name Target Date Goal status  1 Pt will demonstrate ability to perform 10 full-depth weight-bearing squats with 0-2/10 pain in order to do the laundry and dishes at home without limitation. 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 Baseline: Increased pain after 30 minutes of sitting 05/06/2021 INITIAL  3 Pt will achieve Lt hip IR AROM of 40 degrees in order to cross her legs to put shoes and socks on without limitation. Baseline: 25 degrees 05/06/2021 INITIAL  4 Pt will achieve 5xSTS </= 12 seconds in order to promote safe ambulation when grocery shopping. Baseline: 18 seconds 05/06/2021 INITIAL  5 Pt will achieve BIL global hip strength of 4+/5 in order to progress her independent strengthening regimen with less limitation. Baseline: See MMT  chart 03/25/2021: See updated MMT chart 05/06/2021 IN PROGRESS    PLAN: PT FREQUENCY: 1-2x/week   PT DURATION: 8 weeks   PLANNED INTERVENTIONS: Therapeutic exercises, Therapeutic activity, Neuro Muscular re-education, Balance training, Gait training, Patient/Family education, Joint mobilization, Stair training, Dry Needling, Spinal mobilization, Cryotherapy, Moist heat, Taping, Vasopneumatic device, Traction, and Manual therapy   PLAN FOR NEXT SESSION: Progress closed-chain hip strengthening/ mobility; assess IT band, spinal passive accessories as able    Vanessa [place], PT, DPT 03/25/21 11:28 AM

## 2021-03-25 ENCOUNTER — Ambulatory Visit: Payer: Medicare Other

## 2021-03-25 ENCOUNTER — Other Ambulatory Visit: Payer: Self-pay

## 2021-03-25 DIAGNOSIS — R262 Difficulty in walking, not elsewhere classified: Secondary | ICD-10-CM

## 2021-03-25 DIAGNOSIS — M25551 Pain in right hip: Secondary | ICD-10-CM

## 2021-03-25 DIAGNOSIS — M6281 Muscle weakness (generalized): Secondary | ICD-10-CM | POA: Diagnosis not present

## 2021-03-26 NOTE — Therapy (Signed)
OUTPATIENT PHYSICAL THERAPY TREATMENT NOTE   Patient Name: Connie Wade MRN: [id number] DOB:10/30/1955, 66 y.o., female Today's Date: 03/27/2021  PCP: Rise Patience, DO REFERRING PROVIDER: Martyn Malay, MD   PT End of Session - 03/27/21 1615     Visit Number 4    Number of Visits 17    Date for PT Re-Evaluation 05/06/21    Authorization Type UHC MCR    Authorization Time Period FOTO v6, v10, KX mod at v15    Progress Note Due on Visit 10    PT Start Time 1615    PT Stop Time 1655    PT Time Calculation (min) 40 min    Activity Tolerance Patient tolerated treatment well    Behavior During Therapy [facility] for tasks assessed/performed               Past Medical History:  Diagnosis Date   ANEMIA, OTHER, UNSPECIFIED 03/25/2006   Bright red blood per rectum 11/30/2019   DEPRESSION 03/17/2007   Enteritis 07/28/2019   Gout 04/27/2016   HEPATITIS B 03/25/2006   HIV 03/25/2006   since 2000   HYDRADENITIS 03/25/2006   HYPERLIPIDEMIA 03/25/2006   HYPERTENSION, BENIGN ESSENTIAL 04/26/2007   MENOPAUSAL SYNDROME 03/25/2006   Qualifier: Diagnosis of  By: Damita Dunnings MD, [place]     Neck pain 07/13/2018   TOBACCO DEPENDENCE 03/25/2006   Tubular adenoma of colon 06/23/2011   Benign, no high grade dysplasia 06/16/11 Followed by [name] Dr. Watt Climes Repeat in 5 years (due 2018)   Tubular adenoma of colon 06/23/2011   Benign, no high grade dysplasia 06/16/11 Followed by [name] Dr. Watt Climes Repeat in 5 years (due 2018)   UTERINE FIBROID 03/25/2006   Past Surgical History:  Procedure Laterality Date   BREAST LUMPECTOMY Bilateral    coil     anuerysm   IR GENERIC HISTORICAL  07/26/2015   IR ANGIO INTRA EXTRACRAN SEL INTERNAL CAROTID BILAT MOD SED 07/26/2015 Consuella Lose, MD [facility]-INTERV RAD   IR GENERIC HISTORICAL  07/26/2015   IR ANGIO VERTEBRAL SEL VERTEBRAL BILAT MOD SED 07/26/2015 Consuella Lose, MD [facility]   OVARIAN CYST REMOVAL  2001   RADIOLOGY WITH ANESTHESIA N/A 12/05/2014    Procedure: RADIOLOGY WITH ANESTHESIA-COING FOR SUBARACHNOID HEMORRHAGE;  Surgeon: Consuella Lose, MD;  Location: [place];  Service: Radiology;  Laterality: N/A;   TEE WITHOUT CARDIOVERSION N/A 01/23/2020   Procedure: TRANSESOPHAGEAL ECHOCARDIOGRAM (TEE);  Surgeon: Sueanne Margarita, MD;  Location: [facility] ENDOSCOPY;  Service: Cardiovascular;  Laterality: N/A;   Patient Active Problem List   Diagnosis Date Noted   Right hand pain 06/03/2020   Aortic valve mass    Neuropathy, arm, right 35/57/3220   Systolic murmur 25/42/7062   Right hip pain 05/12/2016   Subarachnoid hemorrhage due to ruptured aneurysm ([place]) 12/04/2014   Tobacco abuse 09/19/2012   HYPERTENSION, BENIGN ESSENTIAL 04/26/2007   Depression 03/17/2007   Degenerative disc disease, lumbar 03/17/2007   Human immunodeficiency virus (HIV) disease ([place]) 03/25/2006   HEPATITIS B 03/25/2006   UTERINE FIBROID 03/25/2006   HLD (hyperlipidemia) 03/25/2006   Allergic rhinitis 03/25/2006    REFERRING DIAG: M25.551 (ICD-10-CM) - Right hip pain  THERAPY DIAG:  Pain in right hip  Muscle weakness (generalized)  Difficulty in walking, not elsewhere classified  PERTINENT HISTORY: Hx of HTN, depression, HIV  PRECAUTIONS/RESTRICTIONS:   none  SUBJECTIVE:  Pt reports no hip pain today, adding that she has seen a pattern of improvement since starting PT. She reports that  she has been doing her HEP daily.  PAIN:  Are you having pain? Yes NPRS scale: 0/10 Pain location: Rt anterior hip PAIN TYPE: aching and sharp Pain description: intermittent  Aggravating factors: stress, prolonged sitting> 30 minutes, standing from seated position Relieving factors: walking, Tylenol  OBJECTIVE:  LE AROM/PROM:   A/PROM Right 03/11/2021 Left 03/11/2021  Hip flexion 104/138p! 110/135  Hip extension 10/15 15/20  Hip abduction 40/55p! 45/60  Hip adduction 18/25p! 25/30  Hip internal rotation 25/40 40/50  Hip external rotation 50/60p! 35/40    (Blank rows = not tested)   LE MMT:   MMT Right 03/11/2021 Left 03/11/2021 Right 03/25/2021 Left 03/25/2021  Hip flexion 3+/5p! 4/5 4+/5 4+/5  Hip extension 3-/5 3/5 3/5 3+/5  Hip abduction 3+/5 3+/5 4/5 4/5  Hip internal rotation 4/5 5/5 4+/5 5/5  Hip external rotation 4/5 5/5 4+/5 5/5  Knee flexion 5/5 5/5    Knee extension 4/5 5/5 4+/5    (Blank rows = not tested)  03/27/2021: 5xSTS: 12 seconds   [facility] Adult PT Treatment:                                                DATE: 03/27/2021 Therapeutic Exercise: Standing Cybex hip abduction with 30# cable 2x10 BIL Standing Cybex hip extension with 30# cable 2x10 BIL Standing Cybex hip flexion with 30# cable 2x10 BIL Dead lift with 45# barbell 3x8 Kickstand stance Pallof press with 3# cable into hip ER and IR 2x10 with 5-sec hold BIL each Standing IT band stretch with overhead reach 2x1 min on Rt Supine DKTC 2x1 min Supine LTR 2x10 Manual Therapy: N/A Neuromuscular re-ed: N/A Therapeutic Activity: N/A Modalities: N/A Self Care: N/A   [facility] Adult PT Treatment:                                                DATE: 03/25/2021 Therapeutic Exercise: Standing Cybex hip abduction with 30# cable 2x10 BIL Standing Cybex hip extension with 30# cable 2x10 BIL Kickstand stance Pallof press with 3# cable into hip ER and IR 2x10 with 5-sec hold BIL each Dead lift with two 10# kettlebells 3x8 Standing IT band stretch with overhead reach 2x1 min on Rt Manual Therapy: Supine lateral Rt hip distraction with mobilization belt x4 minutes Supine Rt hip long-axis distraction x4 minutes Neuromuscular re-ed: N/A Therapeutic Activity: N/A Modalities: N/A Self Care: N/A    TREATMENT 03/18/2021:  Therapeutic Exercise: - nu-step L5 61m while taking subjective and planning session with patient - LTR - 20x - bridge - 2x10 5'' hold - Hip IR/adduction ball squeeze - 2x10 - 3'' - ER clam shell - alternating - RTB - 2x20 - Modified thomas with  contralateral SKTC (R hip flexor) - stopped d/t pain - S/L hip abd - 2x10  - Alternating SLR, pt in supine, 2x20  Manual Therapy: - LA R hip distraction - Lateral glide of R hip, pt in supine with gait belt  HOME EXERCISE PROGRAM: Access Code: [email or web address] URL: [email or web address] Date: 03/11/2021 Prepared by: Vanessa [place]   Exercises: Supine Bridge - 1 x daily - 7 x weekly - 3 sets - 10 reps - 3-sec hold Sidelying Hip Abduction - 1 x daily -  7 x weekly - 2 sets - 10 reps - 3-sec hold Modified Thomas Stretch - 1 x daily - 7 x weekly - 1 sets - 2-min hold Mini Squat with Counter Support - 1 x daily - 7 x weekly - 3 sets - 10 reps Walking Program   ASSESSMENT:   CLINICAL IMPRESSION: Pt continues to respond well to progressed hip strengthening exercises, demonstrating good form and no pain. She also continues to show improvements in objective measures as her 5xSTS improved from 18 seconds to 12 seconds. She will continue to benefit from skilled PT to address her primary impairments and return to her prior level of function with less limitation.     GOALS: Goals reviewed with patient? Yes   SHORT TERM GOALS:   STG Name Target Date Goal status  1 Pt will report understanding and adherence to her HEP in order to promote independence in the management of her primary impairments. Baseline: HEP provided at eval 03/25/2021: Pt reports regular adherence to her HEP 04/08/2021 ACHIEVED    LONG TERM GOALS:    LTG Name Target Date Goal status  1 Pt will demonstrate ability to perform 10 full-depth weight-bearing squats with 0-2/10 pain in order to do the laundry and dishes at home without limitation. 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 Baseline: Increased pain after 30 minutes of sitting 05/06/2021  INITIAL  3 Pt will achieve Lt hip IR AROM of 40 degrees in order to cross her legs to put shoes and socks on without limitation. Baseline: 25 degrees 05/06/2021 INITIAL  4 Pt will achieve 5xSTS </= 12 seconds in order to promote safe ambulation when grocery shopping. Baseline: 18 seconds 03/27/2021: 12 seconds  05/06/2021 ACHIEVED  5 Pt will achieve BIL global hip strength of 4+/5 in order to progress her independent strengthening regimen with less limitation. Baseline: See MMT chart 03/25/2021: See updated MMT chart 05/06/2021 IN PROGRESS    PLAN: PT FREQUENCY: 1-2x/week   PT DURATION: 8 weeks   PLANNED INTERVENTIONS: Therapeutic exercises, Therapeutic activity, Neuro Muscular re-education, Balance training, Gait training, Patient/Family education, Joint mobilization, Stair training, Dry Needling, Spinal mobilization, Cryotherapy, Moist heat, Taping, Vasopneumatic device, Traction, and Manual therapy   PLAN FOR NEXT SESSION: Progress closed-chain hip strengthening/ mobility; assess IT band, spinal passive accessories as able    Vanessa [place], PT, DPT 03/27/21 4:55 PM

## 2021-03-27 ENCOUNTER — Ambulatory Visit: Payer: Medicare Other | Attending: Family Medicine

## 2021-03-27 ENCOUNTER — Other Ambulatory Visit: Payer: Self-pay

## 2021-03-27 DIAGNOSIS — M6281 Muscle weakness (generalized): Secondary | ICD-10-CM | POA: Diagnosis not present

## 2021-03-27 DIAGNOSIS — M25551 Pain in right hip: Secondary | ICD-10-CM | POA: Diagnosis not present

## 2021-03-27 DIAGNOSIS — R262 Difficulty in walking, not elsewhere classified: Secondary | ICD-10-CM | POA: Diagnosis not present

## 2021-04-01 ENCOUNTER — Ambulatory Visit: Payer: Medicare Other

## 2021-04-01 ENCOUNTER — Other Ambulatory Visit: Payer: Self-pay

## 2021-04-01 DIAGNOSIS — M25551 Pain in right hip: Secondary | ICD-10-CM | POA: Diagnosis not present

## 2021-04-01 DIAGNOSIS — R262 Difficulty in walking, not elsewhere classified: Secondary | ICD-10-CM | POA: Diagnosis not present

## 2021-04-01 DIAGNOSIS — M6281 Muscle weakness (generalized): Secondary | ICD-10-CM | POA: Diagnosis not present

## 2021-04-01 NOTE — Therapy (Signed)
OUTPATIENT PHYSICAL THERAPY TREATMENT NOTE   Patient Name: Connie Wade MRN: [id number] DOB:1955/06/08, 66 y.o., female Today's Date: 04/01/2021  PCP: Rise Patience, DO REFERRING PROVIDER: Rise Patience, DO   PT End of Session - 04/01/21 0920     Visit Number 5    Number of Visits 17    Date for PT Re-Evaluation 05/06/21    Authorization Type UHC MCR    Authorization Time Period FOTO v6, v10, KX mod at v15    Progress Note Due on Visit 10    PT Start Time 0918    PT Stop Time 1000    PT Time Calculation (min) 42 min    Activity Tolerance Patient tolerated treatment well    Behavior During Therapy [facility] for tasks assessed/performed                Past Medical History:  Diagnosis Date   ANEMIA, OTHER, UNSPECIFIED 03/25/2006   Bright red blood per rectum 11/30/2019   DEPRESSION 03/17/2007   Enteritis 07/28/2019   Gout 04/27/2016   HEPATITIS B 03/25/2006   HIV 03/25/2006   since 2000   HYDRADENITIS 03/25/2006   HYPERLIPIDEMIA 03/25/2006   HYPERTENSION, BENIGN ESSENTIAL 04/26/2007   MENOPAUSAL SYNDROME 03/25/2006   Qualifier: Diagnosis of  By: Damita Dunnings MD, [place]     Neck pain 07/13/2018   TOBACCO DEPENDENCE 03/25/2006   Tubular adenoma of colon 06/23/2011   Benign, no high grade dysplasia 06/16/11 Followed by [name] Dr. Watt Climes Repeat in 5 years (due 2018)   Tubular adenoma of colon 06/23/2011   Benign, no high grade dysplasia 06/16/11 Followed by [name] Dr. Watt Climes Repeat in 5 years (due 2018)   UTERINE FIBROID 03/25/2006   Past Surgical History:  Procedure Laterality Date   BREAST LUMPECTOMY Bilateral    coil     anuerysm   IR GENERIC HISTORICAL  07/26/2015   IR ANGIO INTRA EXTRACRAN SEL INTERNAL CAROTID BILAT MOD SED 07/26/2015 [facility], [facility]   IR GENERIC HISTORICAL  07/26/2015   IR ANGIO VERTEBRAL SEL VERTEBRAL BILAT MOD SED 07/26/2015 [facility] L[facility], [facility]   OVARIAN CYST REMOVAL  2001   RADIOLOGY WITH ANESTHESIA N/A 12/05/2014    Procedure: RADIOLOGY WITH ANESTHESIA-COING FOR SUBARACHNOID HEMORRHAGE;  Surgeon: Consuella Lose, MD;  Location: [place];  Service: Radiology;  Laterality: N/A;   TEE WITHOUT CARDIOVERSION N/A 01/23/2020   Procedure: TRANSESOPHAGEAL ECHOCARDIOGRAM (TEE);  Surgeon: Sueanne Margarita, MD;  Location: [facility] ENDOSCOPY;  Service: Cardiovascular;  Laterality: N/A;   Patient Active Problem List   Diagnosis Date Noted   Right hand pain 06/03/2020   Aortic valve mass    Neuropathy, arm, right 71/24/5809   Systolic murmur 98/33/8250   Right hip pain 05/12/2016   Subarachnoid hemorrhage due to ruptured aneurysm ([place]) 12/04/2014   Tobacco abuse 09/19/2012   HYPERTENSION, BENIGN ESSENTIAL 04/26/2007   Depression 03/17/2007   Degenerative disc disease, lumbar 03/17/2007   Human immunodeficiency virus (HIV) disease ([place]) 03/25/2006   HEPATITIS B 03/25/2006   UTERINE FIBROID 03/25/2006   HLD (hyperlipidemia) 03/25/2006   Allergic rhinitis 03/25/2006    REFERRING DIAG: M25.551 (ICD-10-CM) - Right hip pain  THERAPY DIAG:  Pain in right hip  Muscle weakness (generalized)  Difficulty in walking, not elsewhere classified  PERTINENT HISTORY: Hx of HTN, depression, HIV  PRECAUTIONS/RESTRICTIONS:   none  SUBJECTIVE:  Pt reports that she is still mildly sore from her last treatment session. However, she reports that she can tell she  is stronger and also has had less pain since starting PT. She reports she was able to walk a block over the weekend to her mother's house without a cane and with no pain. She reports that she has continued to do her HEP daily.  PAIN:  Are you having pain? Yes NPRS scale: 0/10 Pain location: Rt anterior hip PAIN TYPE: aching and sharp Pain description: intermittent  Aggravating factors: stress, prolonged sitting> 30 minutes, standing from seated position Relieving factors: walking, Tylenol  OBJECTIVE:  LE AROM/PROM:   A/PROM Right 03/11/2021 Left 03/11/2021  Hip  flexion 104/138p! 110/135  Hip extension 10/15 15/20  Hip abduction 40/55p! 45/60  Hip adduction 18/25p! 25/30  Hip internal rotation 25/40 40/50  Hip external rotation 50/60p! 35/40   (Blank rows = not tested)   LE MMT:   MMT Right 03/11/2021 Left 03/11/2021 Right 03/25/2021 Left 03/25/2021  Hip flexion 3+/5p! 4/5 4+/5 4+/5  Hip extension 3-/5 3/5 3/5 3+/5  Hip abduction 3+/5 3+/5 4/5 4/5  Hip internal rotation 4/5 5/5 4+/5 5/5  Hip external rotation 4/5 5/5 4+/5 5/5  Knee flexion 5/5 5/5    Knee extension 4/5 5/5 4+/5    (Blank rows = not tested)  03/27/2021: 5xSTS: 12 seconds   [facility] Adult PT Treatment:                                                DATE: 04/01/2021 Therapeutic Exercise: NuStep level 5 x4 minutes while collecting subjective information Standing cross-body knee drive with elbow tap with 7# cable on thigh at FreeMotion 2x10 BIL Kickstand stance Pallof press with 3# cable into hip ER and IR 2x10 with 5-sec hold BIL each while standing on Airex pad Standing Cybex hip abduction with 30# cable 2x10 BIL Standing Cybex hip extension with 30# cable 2x10 BIL Dead lift with 55# barbell 3x8 Standing IT band stretch with overhead reach 2x1 min on Rt Manual Therapy: N/A Neuromuscular re-ed: N/A Therapeutic Activity: N/A Modalities: N/A Self Care: N/A   [facility] Adult PT Treatment:                                                DATE: 03/27/2021 Therapeutic Exercise: Standing Cybex hip abduction with 30# cable 2x10 BIL Standing Cybex hip extension with 30# cable 2x10 BIL Standing Cybex hip flexion with 30# cable 2x10 BIL Dead lift with 45# barbell 3x8 Kickstand stance Pallof press with 3# cable into hip ER and IR 2x10 with 5-sec hold BIL each Standing IT band stretch with overhead reach 2x1 min on Rt Supine DKTC 2x1 min Supine LTR 2x10 Manual Therapy: N/A Neuromuscular re-ed: N/A Therapeutic Activity: N/A Modalities: N/A Self Care: N/A   [facility] Adult PT  Treatment:                                                DATE: 03/25/2021 Therapeutic Exercise: Standing Cybex hip abduction with 30# cable 2x10 BIL Standing Cybex hip extension with 30# cable 2x10 BIL Kickstand stance Pallof press with 3# cable into hip ER and IR 2x10 with 5-sec  hold BIL each Dead lift with two 10# kettlebells 3x8 Standing IT band stretch with overhead reach 2x1 min on Rt Manual Therapy: Supine lateral Rt hip distraction with mobilization belt x4 minutes Supine Rt hip long-axis distraction x4 minutes Neuromuscular re-ed: N/A Therapeutic Activity: N/A Modalities: N/A Self Care: N/A     HOME EXERCISE PROGRAM: Access Code: [email or web address] URL: [email or web address] Date: 03/11/2021 Prepared by: Vanessa [place]   Exercises: Supine Bridge - 1 x daily - 7 x weekly - 3 sets - 10 reps - 3-sec hold Sidelying Hip Abduction - 1 x daily - 7 x weekly - 2 sets - 10 reps - 3-sec hold Modified Thomas Stretch - 1 x daily - 7 x weekly - 1 sets - 2-min hold Mini Squat with Counter Support - 1 x daily - 7 x weekly - 3 sets - 10 reps Walking Program   ASSESSMENT:   CLINICAL IMPRESSION: Pt responded well to all interventions today, demonstrating good form and no increase in pain with progressed hip strengthening exercises. She continues to report improved strength and functional walking ability since starting PT. She will continue to benefit from skilled PT to address her primary impairments and return to her prior level of function with less limitation.     GOALS: Goals reviewed with patient? Yes   SHORT TERM GOALS:   STG Name Target Date Goal status  1 Pt will report understanding and adherence to her HEP in order to promote independence in the management of her primary impairments. Baseline: HEP provided at eval 03/25/2021: Pt reports regular adherence to her HEP 04/08/2021 ACHIEVED    LONG TERM GOALS:    LTG Name Target Date Goal status  1 Pt will  demonstrate ability to perform 10 full-depth weight-bearing squats with 0-2/10 pain in order to do the laundry and dishes at home without limitation. 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 Baseline: Increased pain after 30 minutes of sitting 05/06/2021 INITIAL  3 Pt will achieve Lt hip IR AROM of 40 degrees in order to cross her legs to put shoes and socks on without limitation. Baseline: 25 degrees 05/06/2021 INITIAL  4 Pt will achieve 5xSTS </= 12 seconds in order to promote safe ambulation when grocery shopping. Baseline: 18 seconds 03/27/2021: 12 seconds  05/06/2021 ACHIEVED  5 Pt will achieve BIL global hip strength of 4+/5 in order to progress her independent strengthening regimen with less limitation. Baseline: See MMT chart 03/25/2021: See updated MMT chart 05/06/2021 IN PROGRESS    PLAN: PT FREQUENCY: 1-2x/week   PT DURATION: 8 weeks   PLANNED INTERVENTIONS: Therapeutic exercises, Therapeutic activity, Neuro Muscular re-education, Balance training, Gait training, Patient/Family education, Joint mobilization, Stair training, Dry Needling, Spinal mobilization, Cryotherapy, Moist heat, Taping, Vasopneumatic device, Traction, and Manual therapy   PLAN FOR NEXT SESSION: Progress closed-chain hip strengthening/ mobility; assess IT band, spinal passive accessories as able    Vanessa [place], PT, DPT 04/01/21 9:58 AM

## 2021-04-02 NOTE — Therapy (Signed)
OUTPATIENT PHYSICAL THERAPY TREATMENT NOTE   Patient Name: Connie Wade MRN: [id number] DOB:December 04, 1955, 66 y.o., female Today's Date: 04/03/2021  PCP: Rise Patience, DO REFERRING PROVIDER: Martyn Malay, MD   PT End of Session - 04/03/21 0911     Visit Number 6    Number of Visits 17    Date for PT Re-Evaluation 05/06/21    Authorization Type UHC MCR    Authorization Time Period FOTO v6, v10, KX mod at v15    Progress Note Due on Visit 10    PT Start Time 0915    PT Stop Time 0955    PT Time Calculation (min) 40 min    Activity Tolerance Patient tolerated treatment well    Behavior During Therapy [facility] for tasks assessed/performed                 Past Medical History:  Diagnosis Date   ANEMIA, OTHER, UNSPECIFIED 03/25/2006   Bright red blood per rectum 11/30/2019   DEPRESSION 03/17/2007   Enteritis 07/28/2019   Gout 04/27/2016   HEPATITIS B 03/25/2006   HIV 03/25/2006   since 2000   HYDRADENITIS 03/25/2006   HYPERLIPIDEMIA 03/25/2006   HYPERTENSION, BENIGN ESSENTIAL 04/26/2007   MENOPAUSAL SYNDROME 03/25/2006   Qualifier: Diagnosis of  By: Damita Dunnings MD, [place]     Neck pain 07/13/2018   TOBACCO DEPENDENCE 03/25/2006   Tubular adenoma of colon 06/23/2011   Benign, no high grade dysplasia 06/16/11 Followed by [name] Dr. Watt Climes Repeat in 5 years (due 2018)   Tubular adenoma of colon 06/23/2011   Benign, no high grade dysplasia 06/16/11 Followed by [name] Dr. Watt Climes Repeat in 5 years (due 2018)   UTERINE FIBROID 03/25/2006   Past Surgical History:  Procedure Laterality Date   BREAST LUMPECTOMY Bilateral    coil     anuerysm   IR GENERIC HISTORICAL  07/26/2015   IR ANGIO INTRA EXTRACRAN SEL INTERNAL CAROTID BILAT MOD SED 07/26/2015 Consuella Lose, MD [facility]-INTERV RAD   IR GENERIC HISTORICAL  07/26/2015   IR ANGIO VERTEBRAL SEL VERTEBRAL BILAT MOD SED 07/26/2015 [facility]ella Lose, [facility]   OVARIAN CYST REMOVAL  2001   RADIOLOGY WITH ANESTHESIA N/A 12/05/2014    Procedure: RADIOLOGY WITH ANESTHESIA-COING FOR SUBARACHNOID HEMORRHAGE;  Surgeon: Consuella Lose, MD;  Location: [place];  Service: Radiology;  Laterality: N/A;   TEE WITHOUT CARDIOVERSION N/A 01/23/2020   Procedure: TRANSESOPHAGEAL ECHOCARDIOGRAM (TEE);  Surgeon: Sueanne Margarita, MD;  Location: [facility] ENDOSCOPY;  Service: Cardiovascular;  Laterality: N/A;   Patient Active Problem List   Diagnosis Date Noted   Right hand pain 06/03/2020   Aortic valve mass    Neuropathy, arm, right 11/19/8525   Systolic murmur 78/24/2353   Right hip pain 05/12/2016   Subarachnoid hemorrhage due to ruptured aneurysm ([place]) 12/04/2014   Tobacco abuse 09/19/2012   HYPERTENSION, BENIGN ESSENTIAL 04/26/2007   Depression 03/17/2007   Degenerative disc disease, lumbar 03/17/2007   Human immunodeficiency virus (HIV) disease ([place]) 03/25/2006   HEPATITIS B 03/25/2006   UTERINE FIBROID 03/25/2006   HLD (hyperlipidemia) 03/25/2006   Allergic rhinitis 03/25/2006    REFERRING DIAG: M25.551 (ICD-10-CM) - Right hip pain  THERAPY DIAG:  Pain in right hip  Muscle weakness (generalized)  Difficulty in walking, not elsewhere classified  PERTINENT HISTORY: Hx of HTN, depression, HIV  PRECAUTIONS/RESTRICTIONS:   none  SUBJECTIVE:  Pt reports that she threw her back out yesterday when lifting her mother, who fell, off the floor. She  reports her mother is fine, but her back is 8-9/10 pain. She also reports moderate Rt hip pain today.  PAIN:  Are you having pain? Yes NPRS scale: 8-9/10 LBP; 5/10 Rt hip pain Pain location: Rt anterior hip PAIN TYPE: aching and sharp Pain description: intermittent  Aggravating factors: stress, prolonged sitting> 30 minutes, standing from seated position Relieving factors: walking, Tylenol  OBJECTIVE:   LE AROM/PROM:   A/PROM Right 03/11/2021 Left 03/11/2021  Hip flexion 104/138p! 110/135  Hip extension 10/15 15/20  Hip abduction 40/55p! 45/60  Hip adduction 18/25p! 25/30   Hip internal rotation 25/40 40/50  Hip external rotation 50/60p! 35/40   (Blank rows = not tested)   LE MMT:   MMT Right 03/11/2021 Left 03/11/2021 Right 03/25/2021 Left 03/25/2021  Hip flexion 3+/5p! 4/5 4+/5 4+/5  Hip extension 3-/5 3/5 3/5 3+/5  Hip abduction 3+/5 3+/5 4/5 4/5  Hip internal rotation 4/5 5/5 4+/5 5/5  Hip external rotation 4/5 5/5 4+/5 5/5  Knee flexion 5/5 5/5    Knee extension 4/5 5/5 4+/5    (Blank rows = not tested)  03/27/2021: 5xSTS: 12 seconds   [facility] Adult PT Treatment:                                                DATE: 04/03/2021 Therapeutic Exercise: Sidelying lumbar open books 2x10 BIL Supine 90/90 abdominal isometric with handhold resistance 3x30sec Forward lunge with single-arm cross-body press with 3# FreeMotion cable 2x10 Standing cross-body knee drive with elbow tap with 7# cable on thigh at FreeMotion 2x10 BIL Standing Cybex hip abduction with 30# cable 2x10 BIL Standing Cybex hip extension with 30# cable 2x10 BIL Manual Therapy: Sidelying grade IV lumbar roll mobilization x1 min BIL with cavitations noted Lateral grade 4 Rt hip mobilization with mobilization belt x6 minutes Neuromuscular re-ed: N/A Therapeutic Activity: N/A Modalities: N/A Self Care: N/A   [facility] Adult PT Treatment:                                                DATE: 04/01/2021 Therapeutic Exercise: NuStep level 5 x4 minutes while collecting subjective information Standing cross-body knee drive with elbow tap with 7# cable on thigh at FreeMotion 2x10 BIL Kickstand stance Pallof press with 3# cable into hip ER and IR 2x10 with 5-sec hold BIL each while standing on Airex pad Standing Cybex hip abduction with 30# cable 2x10 BIL Standing Cybex hip extension with 30# cable 2x10 BIL Dead lift with 55# barbell 3x8 Standing IT band stretch with overhead reach 2x1 min on Rt Manual Therapy: N/A Neuromuscular re-ed: N/A Therapeutic Activity: N/A Modalities: N/A Self  Care: N/A   [facility] Adult PT Treatment:                                                DATE: 03/27/2021 Therapeutic Exercise: Standing Cybex hip abduction with 30# cable 2x10 BIL Standing Cybex hip extension with 30# cable 2x10 BIL Standing Cybex hip flexion with 30# cable 2x10 BIL Dead lift with 45# barbell 3x8 Kickstand stance Pallof press with 3# cable into  hip ER and IR 2x10 with 5-sec hold BIL each Standing IT band stretch with overhead reach 2x1 min on Rt Supine DKTC 2x1 min Supine LTR 2x10 Manual Therapy: N/A Neuromuscular re-ed: N/A Therapeutic Activity: N/A Modalities: N/A Self Care: N/A       HOME EXERCISE PROGRAM: Access Code: [email or web address] URL: [email or web address] Date: 03/11/2021 Prepared by: Vanessa Coxton   Exercises: Supine Bridge - 1 x daily - 7 x weekly - 3 sets - 10 reps - 3-sec hold Sidelying Hip Abduction - 1 x daily - 7 x weekly - 2 sets - 10 reps - 3-sec hold Modified Thomas Stretch - 1 x daily - 7 x weekly - 1 sets - 2-min hold Mini Squat with Counter Support - 1 x daily - 7 x weekly - 3 sets - 10 reps Walking Program   ASSESSMENT:   CLINICAL IMPRESSION: Pt responded excellently to all interventions today, demonstrating good form and no increase in pain with selected exercises. Of note, she reports a decrease in LBP from 8/10 to 1/10 following lumbar roll mobilization. She also reports improvements in hip pain from 5/10 to 0/10 following lateral distraction and exercises. She will continue to benefit from skilled PT to address her primary impairments and return to her prior level of function with less limitation.     GOALS: Goals reviewed with patient? Yes   SHORT TERM GOALS:   STG Name Target Date Goal status  1 Pt will report understanding and adherence to her HEP in order to promote independence in the management of her primary impairments. Baseline: HEP provided at eval 03/25/2021: Pt reports regular adherence to her HEP  04/08/2021 ACHIEVED    LONG TERM GOALS:    LTG Name Target Date Goal status  1 Pt will demonstrate ability to perform 10 full-depth weight-bearing squats with 0-2/10 pain in order to do the laundry and dishes at home without limitation. 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 Baseline: Increased pain after 30 minutes of sitting 05/06/2021 INITIAL  3 Pt will achieve Lt hip IR AROM of 40 degrees in order to cross her legs to put shoes and socks on without limitation. Baseline: 25 degrees 05/06/2021 INITIAL  4 Pt will achieve 5xSTS </= 12 seconds in order to promote safe ambulation when grocery shopping. Baseline: 18 seconds 03/27/2021: 12 seconds  05/06/2021 ACHIEVED  5 Pt will achieve BIL global hip strength of 4+/5 in order to progress her independent strengthening regimen with less limitation. Baseline: See MMT chart 03/25/2021: See updated MMT chart 05/06/2021 IN PROGRESS    PLAN: PT FREQUENCY: 1-2x/week   PT DURATION: 8 weeks   PLANNED INTERVENTIONS: Therapeutic exercises, Therapeutic activity, Neuro Muscular re-education, Balance training, Gait training, Patient/Family education, Joint mobilization, Stair training, Dry Needling, Spinal mobilization, Cryotherapy, Moist heat, Taping, Vasopneumatic device, Traction, and Manual therapy   PLAN FOR NEXT SESSION: Progress closed-chain hip strengthening/ mobility; assess IT band, spinal passive accessories as able    Vanessa Rusk, PT, DPT 04/03/21 9:57 AM

## 2021-04-03 ENCOUNTER — Other Ambulatory Visit: Payer: Self-pay

## 2021-04-03 ENCOUNTER — Ambulatory Visit: Payer: Medicare Other

## 2021-04-03 DIAGNOSIS — M25551 Pain in right hip: Secondary | ICD-10-CM

## 2021-04-03 DIAGNOSIS — M6281 Muscle weakness (generalized): Secondary | ICD-10-CM | POA: Diagnosis not present

## 2021-04-03 DIAGNOSIS — R262 Difficulty in walking, not elsewhere classified: Secondary | ICD-10-CM

## 2021-04-07 ENCOUNTER — Encounter: Payer: Self-pay | Admitting: Family Medicine

## 2021-04-07 ENCOUNTER — Other Ambulatory Visit: Payer: Self-pay

## 2021-04-07 ENCOUNTER — Ambulatory Visit (INDEPENDENT_AMBULATORY_CARE_PROVIDER_SITE_OTHER): Payer: Medicare Other | Admitting: Family Medicine

## 2021-04-07 VITALS — BP 133/72 | HR 81 | Ht 61.0 in | Wt 104.0 lb

## 2021-04-07 DIAGNOSIS — M2012 Hallux valgus (acquired), left foot: Secondary | ICD-10-CM

## 2021-04-07 DIAGNOSIS — Z23 Encounter for immunization: Secondary | ICD-10-CM

## 2021-04-07 DIAGNOSIS — M2011 Hallux valgus (acquired), right foot: Secondary | ICD-10-CM

## 2021-04-07 DIAGNOSIS — L84 Corns and callosities: Secondary | ICD-10-CM

## 2021-04-07 DIAGNOSIS — B070 Plantar wart: Secondary | ICD-10-CM

## 2021-04-07 NOTE — Patient Instructions (Signed)
It was so great seeing you today! Today we discussed the following: ? ?-For your callus, keep doing the treatments you are doing at home as that actually seems to be working very well for your callus.  The area on your other foot was treated with some nitroglycerin freeze today, please let us know if you have any issues, there may be a little bit of redness to the area after this. ? ?-I am placing a referral to podiatry so that he can have someone to follow long-term for your foot health.  If you have any issues, please let our office know. ? ? ?Please make sure to bring any medications you take to your appointments. If you have any questions or concerns please call the office at [phone].  ? ?

## 2021-04-07 NOTE — Progress Notes (Signed)
? ? ?  SUBJECTIVE:  ? ?CHIEF COMPLAINT / HPI:  ? ?Patient presents to check in on care of her left plantar callus.  She notes that it has been present for a little bit of time but it had gotten worse, she has been using OTC callus dissolving drops for treatment with improvement. ? ?She also notes a spot on the plantar surface of her right foot that has been giving her a little bit of discomfort when palpated or walking on it that is not present for a few weeks.  She does not know of any specific injury to the area. ? ?PERTINENT  PMH / PSH: Reviewed ? ?OBJECTIVE:  ? ?BP 133/72   Pulse 81   Ht '5\' 1"'$  (1.549 m)   Wt 104 lb (47.2 kg)   SpO2 99%   BMI 19.65 kg/m?   ?General: NAD, well-appearing, well-nourished ?Respiratory: No respiratory distress, breathing comfortably, able to speak in full sentences ?Skin: warm and dry, no rashes noted on exposed skin. Well-healing callous of the plantar surface of the left fifth metatarsal.  Plantar wart on the right first metatarsal base. ?Psych: Appropriate affect and mood ? ?Procedure: ?Patient consented to cryotherapy treatment of the right plantar wart. Two full freeze and thaw application completed. Patient tolerated the procedure well.  ? ?ASSESSMENT/PLAN:  ? ?Hallux valgus  plantar callus ?Plantar callus on the fifth metatarsal base that is overall well healing with OTC treatment.  No need for scraping/intervention at this time.  After evaluation of the foot, concern for possible falling arches.  Feel the patient would benefit from long-term podiatry care. ?- Referral to podiatry placed ?- Continue OTC callus treatment ?- Return precautions given ? ?Plantar wart ?Plantar wart of the right first metatarsal base, treated with cryotherapy. ?- Return precautions given ? ? ?Ellwyn Ergle, DO ?[place]  ?

## 2021-04-10 NOTE — Therapy (Signed)
?OUTPATIENT PHYSICAL THERAPY TREATMENT NOTE ? ? ?Patient Name: Connie Wade ?MRN: [id number] ?DOB:August 10, 1955, 66 y.o., female ?Today's Date: 04/11/2021 ? ?PCP: Rise Patience, DO ?REFERRING PROVIDER: Martyn Malay, MD ? ? PT End of Session - 04/11/21 0914   ? ? Visit Number 7   ? Number of Visits 17   ? Date for PT Re-Evaluation 05/06/21   ? Authorization Type UHC MCR   ? Authorization Time Period FOTO v6, v10, KX mod at v15   ? Progress Note Due on Visit 10   ? PT Start Time 1856   ? PT Stop Time 1000   ? PT Time Calculation (min) 45 min   ? Activity Tolerance Patient tolerated treatment well   ? Behavior During Therapy [facility] for tasks assessed/performed   ? ?  ?  ? ?  ? ? ? ? ? ? ? ?Past Medical History:  ?Diagnosis Date  ? ANEMIA, OTHER, UNSPECIFIED 03/25/2006  ? Bright red blood per rectum 11/30/2019  ? DEPRESSION 03/17/2007  ? Enteritis 07/28/2019  ? Gout 04/27/2016  ? HEPATITIS B 03/25/2006  ? HIV 03/25/2006  ? since 2000  ? HYDRADENITIS 03/25/2006  ? HYPERLIPIDEMIA 03/25/2006  ? HYPERTENSION, BENIGN ESSENTIAL 04/26/2007  ? MENOPAUSAL SYNDROME 03/25/2006  ? Qualifier: Diagnosis of  By: Damita Dunnings MD, Phillip Heal    ? Neck pain 07/13/2018  ? TOBACCO DEPENDENCE 03/25/2006  ? Tubular adenoma of colon 06/23/2011  ? Benign, no high grade dysplasia 06/16/11 Followed by [name] Dr. Watt Climes Repeat in 5 years (due 2018)  ? Tubular adenoma of colon 06/23/2011  ? Benign, no high grade dysplasia 06/16/11 Followed by [name] Dr. Watt Climes Repeat in 5 years (due 2018)  ? UTERINE FIBROID 03/25/2006  ? ?Past Surgical History:  ?Procedure Laterality Date  ? BREAST LUMPECTOMY Bilateral   ? coil    ? anuerysm  ? IR GENERIC HISTORICAL  07/26/2015  ? IR ANGIO INTRA EXTRACRAN SEL INTERNAL CAROTID BILAT MOD SED 07/26/2015 [facility]ose, [facility]  ? IR GENERIC HISTORICAL  07/26/2015  ? IR ANGIO VERTEBRAL SEL VERTEBRAL BILAT MOD SED 07/26/2015 [facility] Lose, [facility]  ? OVARIAN CYST REMOVAL  2001  ? RADIOLOGY WITH ANESTHESIA N/A 12/05/2014   ? Procedure: RADIOLOGY WITH ANESTHESIA-COING FOR SUBARACHNOID HEMORRHAGE;  Surgeon: Consuella Lose, MD;  Location: [place];  Service: Radiology;  Laterality: N/A;  ? TEE WITHOUT CARDIOVERSION N/A 01/23/2020  ? Procedure: TRANSESOPHAGEAL ECHOCARDIOGRAM (TEE);  Surgeon: Sueanne Margarita, MD;  Location: [place];  Service: Cardiovascular;  Laterality: N/A;  ? ?Patient Active Problem List  ? Diagnosis Date Noted  ? Right hand pain 06/03/2020  ? Aortic valve mass   ? Neuropathy, arm, right 06/23/2019  ? Systolic murmur 31/49/7026  ? Right hip pain 05/12/2016  ? Subarachnoid hemorrhage due to ruptured aneurysm ([place]) 12/04/2014  ? Tobacco abuse 09/19/2012  ? HYPERTENSION, BENIGN ESSENTIAL 04/26/2007  ? Depression 03/17/2007  ? Degenerative disc disease, lumbar 03/17/2007  ? Human immunodeficiency virus (HIV) disease ([place]) 03/25/2006  ? HEPATITIS B 03/25/2006  ? UTERINE FIBROID 03/25/2006  ? HLD (hyperlipidemia) 03/25/2006  ? Allergic rhinitis 03/25/2006  ? ? ?REFERRING DIAG: M25.551 (ICD-10-CM) - Right hip pain ? ?THERAPY DIAG:  ?Pain in right hip ? ?Muscle weakness (generalized) ? ?Difficulty in walking, not elsewhere classified ? ?PERTINENT HISTORY: Hx of HTN, depression, HIV ? ?PRECAUTIONS/RESTRICTIONS:  ? ?none ? ?SUBJECTIVE:  ?Pt reports non-adherence to her HEP this week due to being busy. Pt reports 1-2/10 Rt hip pain  and 5/10 LBP. ? ?PAIN:  ?Are you having pain? Yes ?NPRS scale: 5/10 LBP; 1-2/10 Rt hip pain ?Pain location: Rt anterior hip ?PAIN TYPE: aching and sharp ?Pain description: intermittent  ?Aggravating factors: stress, prolonged sitting> 30 minutes, standing from seated position ?Relieving factors: walking, Tylenol ? ?OBJECTIVE: ? ?FOTO: 71%, projected 73% in 11 visits ?04/11/2021: 72% ?LE AROM/PROM: ?  ?A/PROM Right ?03/11/2021 Left ?03/11/2021 Right ?04/11/2021  ?Hip flexion 104/138p! 110/135 115/135p!  ?Hip extension 10/15 15/20   ?Hip abduction 40/55p! 45/60 55/60 minor pain  ?Hip adduction 18/25p!  25/30 25/30 minor pain  ?Hip internal rotation '25/40 40/50 35/45 '$  ?Hip external rotation 50/60p! 35/40 54/60  ? (Blank rows = not tested) ?  ?LE MMT: ?  ?MMT Right ?03/11/2021 Left ?03/11/2021 Right ?03/25/2021 Left ?03/25/2021 Right ?04/11/2021 Left ?04/11/2021  ?Hip flexion 3+/5p! 4/5 4+/5 4+/5 5/5 5/5  ?Hip extension 3-/5 3/5 3/5 3+/5 4+/5 4+/5  ?Hip abduction 3+/5 3+/5 4/5 4/5 4+/5 4+/5  ?Hip internal rotation 4/5 5/5 4+/5 5/5 5/5 5/5  ?Hip external rotation 4/5 5/5 4+/5 5/5 5/5 5/5  ?Knee flexion 5/5 5/5   5/5 5/5  ?Knee extension 4/5 5/5 4+/5  5/5 5/5  ? (Blank rows = not tested) ? ?03/27/2021: ?5xSTS: 12 seconds ? ? ?[facility] Adult PT Treatment:                                                DATE: 04/11/2021 ?Therapeutic Exercise: ?Treadmill walking at 92mh with cues for adequate hip abduction to prevent scissoring, while collecting subjective information x4 minutes ?Squat with overhead reach and calf raise on ascent with two 7# cables attached to waist attachment on FreeMotion machine 3x8 ?Mini-squat side stepping with waist attachment and 10# cable 2x5 walkouts BIL ?Kickstand stance Pallof press with 3# cable into hip ER and IR 2x10 with 5-sec hold BIL each while standing on Airex pad ?Tall-kneeling on Airex pad with hip extension with 23# cable 3x8 ?Manual Therapy: ?N/A ?Neuromuscular re-ed: ?N/A ?Therapeutic Activity: ?Re-administration of FOTO with education ?Re-assessment of objective measures with education on progress made to this point ?Modalities: ?N/A ?Self Care: ?N/A ? ? [facility]                                                DATE: 04/03/2021 ?Therapeutic Exercise: ?Sidelying lumbar open books 2x10 BIL ?Supine 90/90 abdominal isometric with handhold resistance 3x30sec ?Forward lunge with single-arm cross-body press with 3# FreeMotion cable 2x10 ?Standing cross-body knee drive with elbow tap with 7# cable on thigh at FreeMotion 2x10 BIL ?Standing Cybex hip abduction with 30# cable 2x10  BIL ?Standing Cybex hip extension with 30# cable 2x10 BIL ?Manual Therapy: ?Sidelying grade IV lumbar roll mobilization x1 min BIL with cavitations noted ?Lateral grade 4 Rt hip mobilization with mobilization belt x6 minutes ?Neuromuscular re-ed: ?N/A ?Therapeutic Activity: ?N/A ?Modalities: ?N/A ?Self Care: ?N/A ? ? ?[facility]                                                DATE: 04/01/2021 ?Therapeutic Exercise: ?  NuStep level 5 x4 minutes while collecting subjective information ?Standing cross-body knee drive with elbow tap with 7# cable on thigh at FreeMotion 2x10 BIL ?Kickstand stance Pallof press with 3# cable into hip ER and IR 2x10 with 5-sec hold BIL each while standing on Airex pad ?Standing Cybex hip abduction with 30# cable 2x10 BIL ?Standing Cybex hip extension with 30# cable 2x10 BIL ?Dead lift with 55# barbell 3x8 ?Standing IT band stretch with overhead reach 2x1 min on Rt ?Manual Therapy: ?N/A ?Neuromuscular re-ed: ?N/A ?Therapeutic Activity: ?N/A ?Modalities: ?N/A ?Self Care: ?N/A ? ? ? ? ? ?HOME EXERCISE PROGRAM: ?Access Code: [email or web address] ?URL: [email or web address] ?Date: 03/11/2021 ?Prepared by: Vanessa [place] ?  ?Exercises: ?Supine Bridge - 1 x daily - 7 x weekly - 3 sets - 10 reps - 3-sec hold ?Sidelying Hip Abduction - 1 x daily - 7 x weekly - 2 sets - 10 reps - 3-sec hold ?Modified Thomas Stretch - 1 x daily - 7 x weekly - 1 sets - 2-min hold ?Mini Squat with Counter Support - 1 x daily - 7 x weekly - 3 sets - 10 reps ?Walking Program ?  ?ASSESSMENT: ?  ?CLINICAL IMPRESSION: ?The pt responded well to all interventions today, demonstrating good form and no increase in pain with selected exercises. Upon re-assessment, the pt has made excellent progress in global Rt hip AROM and BIL global hip strength. Additionally, she demonstrates full-depth squatting with no increase in pain with newly added squat exercise today. She will continue to benefit from skilled PT to  address her primary impairments and return to her prior level of function with less limitation. ?  ?  ?GOALS: ?Goals reviewed with patient? Yes ?  ?SHORT TERM GOALS: ?  ?STG Name Target Date Goal status  ?1 Pt will r

## 2021-04-11 ENCOUNTER — Other Ambulatory Visit: Payer: Self-pay

## 2021-04-11 ENCOUNTER — Ambulatory Visit: Payer: Medicare Other

## 2021-04-11 DIAGNOSIS — R262 Difficulty in walking, not elsewhere classified: Secondary | ICD-10-CM

## 2021-04-11 DIAGNOSIS — M25551 Pain in right hip: Secondary | ICD-10-CM

## 2021-04-11 DIAGNOSIS — M6281 Muscle weakness (generalized): Secondary | ICD-10-CM

## 2021-04-15 NOTE — Therapy (Addendum)
?OUTPATIENT PHYSICAL THERAPY TREATMENT NOTE/ DISCHARGE SUMMARY ? ? ?Patient Name: Connie Wade ?MRN: [id number] ?DOB:1955/09/03, 66 y.o., female ?Today's Date: 04/16/2021 ? ?PCP: Rise Patience, DO ?REFERRING PROVIDER: Martyn Malay, MD ? ? PT End of Session - 04/16/21 0912   ? ? Visit Number 8   ? Number of Visits 17   ? Date for PT Re-Evaluation 05/06/21   ? Authorization Type UHC MCR   ? Authorization Time Period FOTO v6, v10, KX mod at v15   ? Progress Note Due on Visit 10   ? PT Start Time 3474   ? PT Stop Time 2595   ? PT Time Calculation (min) 42 min   ? Equipment Utilized During Treatment Gait belt   ? Activity Tolerance Patient tolerated treatment well   ? Behavior During Therapy [facility] for tasks assessed/performed   ? ?  ?  ? ?  ? ? ? ? ? ? ? ? ?Past Medical History:  ?Diagnosis Date  ? ANEMIA, OTHER, UNSPECIFIED 03/25/2006  ? Bright red blood per rectum 11/30/2019  ? DEPRESSION 03/17/2007  ? Enteritis 07/28/2019  ? Gout 04/27/2016  ? HEPATITIS B 03/25/2006  ? HIV 03/25/2006  ? since 2000  ? HYDRADENITIS 03/25/2006  ? HYPERLIPIDEMIA 03/25/2006  ? HYPERTENSION, BENIGN ESSENTIAL 04/26/2007  ? MENOPAUSAL SYNDROME 03/25/2006  ? Qualifier: Diagnosis of  By: Damita Dunnings MD, Phillip Heal    ? Neck pain 07/13/2018  ? TOBACCO DEPENDENCE 03/25/2006  ? Tubular adenoma of colon 06/23/2011  ? Benign, no high grade dysplasia 06/16/11 Followed [name] Dr. Watt Climes Repeat in 5 years (due 2018)  ? Tubular adenoma of colon 06/23/2011  ? Benign, no high grade dysplasia 06/16/11 Followed by [name] Dr. Watt Climes Repeat in 5 years (due 2018)  ? UTERINE FIBROID 03/25/2006  ? ?Past Surgical History:  ?Procedure Laterality Date  ? BREAST LUMPECTOMY Bilateral   ? coil    ? anuerysm  ? IR GENERIC HISTORICAL  07/26/2015  ? IR ANGIO INTRA EXTRACRAN SEL INTERNAL CAROTID BILAT MOD SED 07/26/2015 Consuella Lose, MD [facility]  ? IR GENERIC HISTORICAL  07/26/2015  ? IR ANGIO VERTEBRAL SEL VERTEBRAL BILAT MOD SED 07/26/2015 Cons[facility] Lose, [facility]   ? OVARIAN CYST REMOVAL  2001  ? RADIOLOGY WITH ANESTHESIA N/A 12/05/2014  ? Procedure: RADIOLOGY WITH ANESTHESIA-COING FOR SUBARACHNOID HEMORRHAGE;  Surgeon: Consuella Lose, MD;  Location: [place];  Service: Radiology;  Laterality: N/A;  ? TEE WITHOUT CARDIOVERSION N/A 01/23/2020  ? Procedure: TRANSESOPHAGEAL ECHOCARDIOGRAM (TEE);  Surgeon: Sueanne Margarita, MD;  Location: [place];  Service: Cardiovascular;  Laterality: N/A;  ? ?Patient Active Problem List  ? Diagnosis Date Noted  ? Right hand pain 06/03/2020  ? Aortic valve mass   ? Neuropathy, arm, right 06/23/2019  ? Systolic murmur 63/87/5643  ? Right hip pain 05/12/2016  ? Subarachnoid hemorrhage due to ruptured aneurysm ([place]) 12/04/2014  ? Tobacco abuse 09/19/2012  ? HYPERTENSION, BENIGN ESSENTIAL 04/26/2007  ? Depression 03/17/2007  ? Degenerative disc disease, lumbar 03/17/2007  ? Human immunodeficiency virus (HIV) disease ([place]) 03/25/2006  ? HEPATITIS B 03/25/2006  ? UTERINE FIBROID 03/25/2006  ? HLD (hyperlipidemia) 03/25/2006  ? Allergic rhinitis 03/25/2006  ? ? ?REFERRING DIAG: M25.551 (ICD-10-CM) - Right hip pain ? ?THERAPY DIAG:  ?Pain in right hip ? ?Muscle weakness (generalized) ? ?Difficulty in walking, not elsewhere classified ? ?PERTINENT HISTORY: Hx of HTN, depression, HIV ? ?PRECAUTIONS/RESTRICTIONS:  ? ?none ? ?SUBJECTIVE:  ?Pt reports having diarrhea yesterday into  this morning, which she reports is the result of eating bad pizza. She reports not doing her HEP this week due to being busy. She denies any hip pain today and rates her back pain as a 2/10. ? ?PAIN:  ?Are you having pain? Yes ?NPRS scale: 2/10 LBP; 0/10 Rt hip pain ?Pain location: Rt anterior hip ?PAIN TYPE: aching and sharp ?Pain description: intermittent  ?Aggravating factors: stress, prolonged sitting> 30 minutes, standing from seated position ?Relieving factors: walking, Tylenol ? ?OBJECTIVE: ? ?FOTO: 71%, projected 73% in 11 visits ?04/11/2021: 72% ?LE AROM/PROM: ?   ?A/PROM Right ?03/11/2021 Left ?03/11/2021 Right ?04/11/2021  ?Hip flexion 104/138p! 110/135 115/135p!  ?Hip extension 10/15 15/20   ?Hip abduction 40/55p! 45/60 55/60 minor pain  ?Hip adduction 18/25p! 25/30 25/30 minor pain  ?Hip internal rotation '25/40 40/50 35/45 '$  ?Hip external rotation 50/60p! 35/40 54/60  ? (Blank rows = not tested) ?  ?LE MMT: ?  ?MMT Right ?03/11/2021 Left ?03/11/2021 Right ?03/25/2021 Left ?03/25/2021 Right ?04/11/2021 Left ?04/11/2021  ?Hip flexion 3+/5p! 4/5 4+/5 4+/5 5/5 5/5  ?Hip extension 3-/5 3/5 3/5 3+/5 4+/5 4+/5  ?Hip abduction 3+/5 3+/5 4/5 4/5 4+/5 4+/5  ?Hip internal rotation 4/5 5/5 4+/5 5/5 5/5 5/5  ?Hip external rotation 4/5 5/5 4+/5 5/5 5/5 5/5  ?Knee flexion 5/5 5/5   5/5 5/5  ?Knee extension 4/5 5/5 4+/5  5/5 5/5  ? (Blank rows = not tested) ? ?03/27/2021: ?5xSTS: 12 seconds ? ? ?[facility] PT Treatment:                                                DATE: 04/16/2021 ?Therapeutic Exercise: ?Treadmill walking at 26mh and level 2 incline with cues for adequate hip abduction to prevent scissoring, while collecting subjective information x5 minutes ?Mini squat side stepping with YTB around ankles x4 322flaps ?Forward and backward monster walks with YTB around ankles x4 [id number] ?Sit-to-stand with 15# kettlebell 3x10 ?Standing abdominal press-down with 17# cable 3x10 ?Kickstand stand on Airex pad with 3# cable Pallof press 2x10 with 5-sec hold BIL into hip ER and IR  ? ?Manual Therapy: ?N/A ?Neuromuscular re-ed: ?N/A ?Therapeutic Activity: ?N/A ?Modalities: ?N/A ?Self Care: ?N/A ? ? ?[facility]:                                                DATE: 04/11/2021 ?Therapeutic Exercise: ?Treadmill walking at 1mp13mith cues for adequate hip abduction to prevent scissoring, while collecting subjective information x4 minutes ?Squat with overhead reach and calf raise on ascent with two 7# cables attached to waist attachment on FreeMotion machine 3x8 ?Mini-squat side stepping with  waist attachment and 10# cable 2x5 walkouts BIL ?Kickstand stance Pallof press with 3# cable into hip ER and IR 2x10 with 5-sec hold BIL each while standing on Airex pad ?Tall-kneeling on Airex pad with hip extension with 23# cable 3x8 ?Manual Therapy: ?N/A ?Neuromuscular re-ed: ?N/A ?Therapeutic Activity: ?Re-administration of FOTO with education ?Re-assessment of objective measures with education on progress made to this point ?Modalities: ?N/A ?Self Care: ?N/A ? ? ?[facility]:  DATE: 04/03/2021 ?Therapeutic Exercise: ?Sidelying lumbar open books 2x10 BIL ?Supine 90/90 abdominal isometric with handhold resistance 3x30sec ?Forward lunge with single-arm cross-body press with 3# FreeMotion cable 2x10 ?Standing cross-body knee drive with elbow tap with 7# cable on thigh at FreeMotion 2x10 BIL ?Standing Cybex hip abduction with 30# cable 2x10 BIL ?Standing Cybex hip extension with 30# cable 2x10 BIL ?Manual Therapy: ?Sidelying grade IV lumbar roll mobilization x1 min BIL with cavitations noted ?Lateral grade 4 Rt hip mobilization with mobilization belt x6 minutes ?Neuromuscular re-ed: ?N/A ?Therapeutic Activity: ?N/A ?Modalities: ?N/A ?Self Care: ?N/A ? ? ? ? ?HOME EXERCISE PROGRAM: ?Access Code: [email or web address] ?URL: [email or web address] ?Date: 03/11/2021 ?Prepared by: Vanessa Sikes ?  ?Exercises: ?Supine Bridge - 1 x daily - 7 x weekly - 3 sets - 10 reps - 3-sec hold ?Sidelying Hip Abduction - 1 x daily - 7 x weekly - 2 sets - 10 reps - 3-sec hold ?Modified Thomas Stretch - 1 x daily - 7 x weekly - 1 sets - 2-min hold ?Mini Squat with Counter Support - 1 x daily - 7 x weekly - 3 sets - 10 reps ?Walking Program ?  ?ASSESSMENT: ?  ?CLINICAL IMPRESSION: ?Pt responded well to all interventions with good form and no increase in pain with performed exercises today. Due to pt having low pain levels today, manual techniques were deferred in favor of more  therapeutic exercises. She will continue to benefit from skilled PT to address her primary impairments and return to her prior level of function with less limitation.  ?  ?  ?GOALS: ?Goals reviewed with patient? Alfred Levins

## 2021-04-16 ENCOUNTER — Ambulatory Visit: Payer: Medicare Other

## 2021-04-16 ENCOUNTER — Other Ambulatory Visit: Payer: Self-pay

## 2021-04-16 DIAGNOSIS — M25551 Pain in right hip: Secondary | ICD-10-CM

## 2021-04-16 DIAGNOSIS — M6281 Muscle weakness (generalized): Secondary | ICD-10-CM | POA: Diagnosis not present

## 2021-04-16 DIAGNOSIS — R262 Difficulty in walking, not elsewhere classified: Secondary | ICD-10-CM | POA: Diagnosis not present

## 2021-04-17 ENCOUNTER — Ambulatory Visit: Payer: Medicare Other | Admitting: Podiatrist

## 2021-04-17 ENCOUNTER — Encounter: Payer: Self-pay | Admitting: Podiatrist

## 2021-04-17 DIAGNOSIS — L84 Corns and callosities: Secondary | ICD-10-CM

## 2021-04-17 NOTE — Patient Instructions (Signed)
Corns and Calluses Corns are small areas of thickened skin that form on the top, sides, or tip of a toe. Corns have a cone-shaped core with a point that can press on a nerve below. This causes pain. Calluses are areas of thickened skin that can form anywhere on the body, including the hands, fingers, palms, soles of the feet, and heels. Calluses are usually larger than corns. What are the causes? Corns and calluses are caused by rubbing (friction) or pressure, such as from shoes that are too tight or do not fit properly. What increases the risk? Corns are more likely to develop in people who have misshapen toes (toe deformities), such as hammer toes. Calluses can form with friction to any area of the skin. They are more likely to develop in people who: Work with their hands. Wear shoes that fit poorly, are too tight, or are high-heeled. Have toe deformities. What are the signs or symptoms? Symptoms of a corn or callus include: A hard growth on the skin. Pain or tenderness under the skin. Redness and swelling. Increased discomfort while wearing tight-fitting shoes, if your feet are affected. If a corn or callus becomes infected, symptoms may include: Redness and swelling that gets worse. Pain. Fluid, blood, or pus draining from the corn or callus. How is this diagnosed? Corns and calluses may be diagnosed based on your symptoms, your medical history, and a physical exam. How is this treated? Treatment for corns and calluses may include: Removing the cause of the friction or pressure. This may involve: Changing your shoes. Wearing shoe inserts (orthotics) or other protective layers in your shoes, such as a corn pad. Wearing gloves. Applying medicine to the skin (topical medicine) to help soften skin in the hardened, thickened areas. Removing layers of dead skin with a file to reduce the size of the corn or callus. Removing the corn or callus with a scalpel or laser. Taking antibiotic  medicines, if your corn or callus is infected. Having surgery, if a toe deformity is the cause. Follow these instructions at home:  Take over-the-counter and prescription medicines only as told by your health care provider. If you were prescribed an antibiotic medicine, take it as told by your health care provider. Do not stop taking it even if your condition improves. Wear shoes that fit well. Avoid wearing high-heeled shoes and shoes that are too tight or too loose. Wear any padding, protective layers, gloves, or orthotics as told by your health care provider. Soak your hands or feet. Then use a file or pumice stone to soften your corn or callus. Do this as told by your health care provider. Check your corn or callus every day for signs of infection. Contact a health care provider if: Your symptoms do not improve with treatment. You have redness or swelling that gets worse. Your corn or callus becomes painful. You have fluid, blood, or pus coming from your corn or callus. You have new symptoms. Get help right away if: You develop severe pain with redness. Summary Corns are small areas of thickened skin that form on the top, sides, or tip of a toe. These can be painful. Calluses are areas of thickened skin that can form anywhere on the body, including the hands, fingers, palms, and soles of the feet. Calluses are usually larger than corns. Corns and calluses are caused by rubbing (friction) or pressure, such as from shoes that are too tight or do not fit properly. Treatment may include wearing padding, protective   layers, gloves, or orthotics as told by your health care provider. This information is not intended to replace advice given to you by your health care provider. Make sure you discuss any questions you have with your health care provider. Document Revised: 05/11/2019 Document Reviewed: 05/11/2019 Elsevier Patient Education  2022 Elsevier Inc.  

## 2021-04-17 NOTE — Progress Notes (Signed)
?Chief Complaint  ?Patient presents with  ? Callouses  ?  Pre-ulcerative corn or callous  ?  ? ?HPI: Patient is 66 y.o. female who presents today for painful callus submetatarsal 5 of the left foot.  She relates it is painful especially when going barefoot.  Is been going on for about 2 years.  She denies any other professional treatment. ? ?Patient Active Problem List  ? Diagnosis Date Noted  ? Right hand pain 06/03/2020  ? Aortic valve mass   ? Neuropathy, arm, right 06/23/2019  ? Systolic murmur 61/44/3154  ? Right hip pain 05/12/2016  ? Subarachnoid hemorrhage due to ruptured aneurysm ([place]) 12/04/2014  ? Tobacco abuse 09/19/2012  ? HYPERTENSION, BENIGN ESSENTIAL 04/26/2007  ? Depression 03/17/2007  ? Degenerative disc disease, lumbar 03/17/2007  ? Human immunodeficiency virus (HIV) disease ([place]) 03/25/2006  ? HEPATITIS B 03/25/2006  ? UTERINE FIBROID 03/25/2006  ? HLD (hyperlipidemia) 03/25/2006  ? Allergic rhinitis 03/25/2006  ? ? ?Current Outpatient Medications on File Prior to Visit  ?Medication Sig Dispense Refill  ? acetaminophen (TYLENOL) 500 MG tablet Take 2 tablets (1,000 mg total) by mouth every 8 (eight) hours as needed. 30 tablet 0  ? diclofenac (FLECTOR) 1.3 % PTCH Place 1 patch onto the skin 2 (two) times daily. Over right hip. (Patient not taking: Reported on 03/11/2021) 60 patch 1  ? fluticasone (FLONASE) 50 MCG/ACT nasal spray SPRAY 2 SPRAYS INTO EACH NOSTRIL EVERY DAY 48 mL 3  ? gabapentin (NEURONTIN) 300 MG capsule Take 1 capsule (300 mg total) by mouth 2 (two) times daily as needed (nerve pain (arm)). 60 capsule 2  ? lisinopril (ZESTRIL) 20 MG tablet TAKE 1 TABLET BY MOUTH  DAILY 60 tablet 5  ? Naphazoline-Pheniramine (OPCON-A) 0.027-0.315 % SOLN Place 1-2 drops into both eyes 3 (three) times daily as needed (allergy eyes).    ? ?No current facility-administered medications on file prior to visit.  ? ? ?Allergies  ?Allergen Reactions  ? Asa [Aspirin]   ?  Avoid due to cerebral aneurysm  ?  Nsaids   ?  Avoid due to cerebral aneurysm   ? Shrimp [Shellfish Allergy] Hives  ? ? ?Review of Systems ?No fevers, chills, nausea, muscle aches, no difficulty breathing, no calf pain, no chest pain or shortness of breath. ? ? ?Physical Exam ? ?GENERAL APPEARANCE: Alert, conversant. Appropriately groomed. No acute distress.  ? ?VASCULAR: Pedal pulses palpable DP and PT bilateral.  Capillary refill time is immediate to all digits,  Proximal to distal cooling it warm to warm.  Digital perfusion adequate.  ? ?NEUROLOGIC: sensation is intact to 5.07 monofilament at 5/5 sites bilateral.  Light touch is intact bilateral, vibratory sensation intact bilateral ? ?MUSCULOSKELETAL: acceptable muscle strength, tone and stability bilateral.  Moderate bunion deformity is noted right first metatarsal phalangeal joint.  Patient also relates she gets gout in this joint as well.  No pain, crepitus or limitation noted with foot and ankle range of motion bilateral.  ? ?DERMATOLOGIC: skin is warm, supple, and dry.  No open lesions noted.  No rash, intractable porokeratotic lesion is noted submetatarsal 5 of the left foot at the head of the fifth metatarsal.  An intractable core is removed which has a groundglass appearance.  Intact integument is present post debridement. ? ?Assessment  ? ?  ICD-10-CM   ?1. Callus of foot  L84   ?  ? ? ? ?Plan ? ?Exam findings were discussed with the patient.  Discussed etiology and pathology  of her callus and pared the callus with a 15 blade without complication.  I was able to remove the core for her at today's visit.  I dispensed pads and discussed offloading and accommodative shoe gear to slow down its recurrence.  She will also try using a pumice stone regularly to keep the callus from reforming.  If any further treatment is required she will call to be seen. ?

## 2021-06-05 ENCOUNTER — Ambulatory Visit: Payer: Medicare Other | Admitting: Cardiology

## 2021-06-05 ENCOUNTER — Encounter: Payer: Self-pay | Admitting: Cardiology

## 2021-06-05 VITALS — BP 100/70 | HR 72 | Ht 61.0 in | Wt 107.0 lb

## 2021-06-05 DIAGNOSIS — I10 Essential (primary) hypertension: Secondary | ICD-10-CM | POA: Diagnosis not present

## 2021-06-05 DIAGNOSIS — I358 Other nonrheumatic aortic valve disorders: Secondary | ICD-10-CM | POA: Diagnosis not present

## 2021-06-05 NOTE — Progress Notes (Signed)
? ?Cardiology Note   ? ?Date:  06/05/2021  ? ?ID:  Connie Wade, DOB [birth year], MRN [id number] ? ?PCP:  Rise Patience, DO  ?Cardiologist:  Fransico Him, MD  ? ?Chief Complaint  ?Patient presents with  ? Follow-up  ?  Abnormal echo and hypertension  ? ? ?History of Present Illness:  ?Connie Wade is a 66 y.o. female with a hx of HIV, Hep B, HLD, HTN and tobacco abuse.  She has a history of 2D echo done for heart murmur showing normal LVF with a small density measuring 0.6 x 0.5cm on the non coronary cusp of the AV with no AI and possible representing a papillary fibroelastoma or calcified leaflet.  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  There was mild to moderate aortic valve sclerosis with no AI and no aortic stenosis.  ? ?She is here today for followup and is doing well.  She denies any chest pain or pressure, SOB, DOE, PND, orthopnea, LE edema, dizziness, palpitations or syncope. She is compliant with her meds and is tolerating meds with no SE.    ? ?Past Medical History:  ?Diagnosis Date  ? ANEMIA, OTHER, UNSPECIFIED 03/25/2006  ? Bright red blood per rectum 11/30/2019  ? DEPRESSION 03/17/2007  ? Enteritis 07/28/2019  ? Gout 04/27/2016  ? HEPATITIS B 03/25/2006  ? HIV 03/25/2006  ? since 2000  ? HYDRADENITIS 03/25/2006  ? HYPERLIPIDEMIA 03/25/2006  ? HYPERTENSION, BENIGN ESSENTIAL 04/26/2007  ? MENOPAUSAL SYNDROME 03/25/2006  ? Qualifier: Diagnosis of  By: Damita Dunnings MD, Phillip Heal    ? Neck pain 07/13/2018  ? TOBACCO DEPENDENCE 03/25/2006  ? Tubular adenoma of colon 06/23/2011  ? Benign, no high grade dysplasia 06/16/11 Followed by [name] Dr. Watt Climes Repeat in 5 years (due 2018)  ? Tubular adenoma of colon 06/23/2011  ? Benign, no high grade dysplasia 06/16/11 Followed by [name] Dr. Watt Climes Repeat in 5 years (due 2018)  ? UTERINE FIBROID 03/25/2006   ? ? ?Past Surgical History:  ?Procedure Laterality Date  ? BREAST LUMPECTOMY Bilateral   ? coil    ? anuerysm  ? IR GENERIC HISTORICAL  07/26/2015  ? IR ANGIO INTRA EXTRACRAN SEL INTERNAL CAROTID BILAT MOD SED 07/26/2015 Cons[facility] Lose, MD [facility]  ? IR GENERIC HISTORICAL  07/26/2015  ? IR ANGIO VERTEBRAL SEL VERTEBRAL BILAT MOD SED 07/26/2015 Consuella Lose, MD [facility]  ? OVARIAN CYST REMOVAL  2001  ? RADIOLOGY WITH ANESTHESIA N/A 12/05/2014  ? Procedure: RADIOLOGY WITH ANESTHESIA-COING FOR SUBARACHNOID HEMORRHAGE;  Surgeon: Consuella Lose, MD;  Location: [place];  Service: Radiology;  Laterality: N/A;  ? TEE WITHOUT CARDIOVERSION N/A 01/23/2020  ? Procedure: TRANSESOPHAGEAL ECHOCARDIOGRAM (TEE);  Surgeon: Sueanne Margarita, MD;  Location: [place];  Service: Cardiovascular;  Laterality: N/A;  ? ? ?Current Medications: ?Current Meds  ?Medication Sig  ? acetaminophen (TYLENOL) 500 MG tablet Take 2 tablets (1,000 mg total) by mouth every 8 (eight) hours as needed.  ? diclofenac (FLECTOR) 1.3 % PTCH Place 1 patch onto the skin 2 (two) times daily. Over right hip.  ? fluticasone (FLONASE) 50 MCG/ACT nasal spray Place into both nostrils in the morning and at bedtime. Pt takes 2 spray in the morning and 2 spray at night in each nostril.  ? gabapentin (NEURONTIN) 300 MG capsule Take 1 capsule (300 mg total) by mouth 2 (two)  times daily as needed (nerve pain (arm)).  ? lisinopril (ZESTRIL) 20 MG tablet TAKE 1 TABLET BY MOUTH  DAILY  ? Naphazoline-Pheniramine (OPCON-A) 0.027-0.315 % SOLN Place 1-2 drops into both eyes 3 (three) times daily as needed (allergy eyes).  ? ? ?Allergies:   Asa [aspirin], Nsaids, and Shrimp [shellfish allergy]  ? ?Social History  ? ?Socioeconomic History  ? Marital status: Widowed  ?  Spouse name: Not on file  ? Number of children: Not on file  ? Years of education: Not on file  ? Highest education level: Not on file  ?Occupational History  ? Not on file  ?Tobacco Use  ? Smoking  status: Former  ?  Packs/day: 1.00  ?  Types: Cigarettes  ?  Quit date: 12/17/2014  ?  Years since quitting: 6.4  ? Smokeless tobacco: Never  ?Substance and Sexual Activity  ? Alcohol use: No  ?  Alcohol/week: 1.0 standard drink  ?  Types: 1 Glasses of wine per week  ?  Comment: EVERYDAY, three beers today  ? Drug use: Yes  ?  Types: Marijuana  ? Sexual activity: Not Currently  ?  Partners: Male  ?Other Topics Concern  ? Not on file  ?Social History Narrative  ? Not on file  ? ?Social Determinants of Health  ? ?Financial Resource Strain: Not on file  ?Food Insecurity: Not on file  ?Transportation Needs: Not on file  ?Physical Activity: Not on file  ?Stress: Not on file  ?Social Connections: Not on file  ?  ? ?Family History:  The patient's family history includes Alcohol abuse in her father; Arthritis in her mother; COPD in her sister.  ? ?ROS:   ?Please see the history of present illness.    ?ROS All other systems reviewed and are negative. ? ?   ? View : No data to display.  ?  ?  ?  ? ? ? ?PHYSICAL EXAM:   ?VS:  BP 100/70   Pulse 72   Ht '5\' 1"'$  (1.549 m)   Wt 107 lb (48.5 kg)   SpO2 98%   BMI 20.22 kg/m?    ? ?GEN: Well nourished, well developed in no acute distress ?HEENT: Normal ?NECK: No JVD; No carotid bruits ?LYMPHATICS: No lymphadenopathy ?CARDIAC:RRR, no murmurs, rubs, gallops ?RESPIRATORY:  Clear to auscultation without rales, wheezing or rhonchi  ?ABDOMEN: Soft, non-tender, non-distended ?MUSCULOSKELETAL:  No edema; No deformity  ?SKIN: Warm and dry ?NEUROLOGIC:  Alert and oriented x 3 ?PSYCHIATRIC:  Normal affect   ?Wt Readings from Last 3 Encounters:  ?06/05/21 107 lb (48.5 kg)  ?04/07/21 104 lb (47.2 kg)  ?02/18/21 100 lb 3.2 oz (45.5 kg)  ?  ? ? ?Studies/Labs Reviewed:  ? ?EKG:  EKG is ordered today.  The ekg ordered today demonstrates NSR with BAE ? ?Recent Labs: ?No results found for requested labs within last 8760 hours.  ? ?Lipid Panel ?   ?Component Value Date/Time  ? CHOL 183 12/11/2019  1107  ? TRIG 123 12/11/2019 1107  ? HDL 73 12/11/2019 1107  ? CHOLHDL 2.5 12/11/2019 1107  ? CHOLHDL 2.2 05/13/2016 1054  ? VLDL 14 05/13/2016 1054  ? [place] 89 12/11/2019 1107  ? LDLDIRECT 109 12/24/2014 1114  ? ? ?Additional studies/ records that were reviewed today include:  ?2D echo ?IMPRESSIONS  ? ? ? 1. There is a small mass attached to the non-coronary cusp of the aortic  ?valve that measures 0.6 cm x 0.5 cm. Suspect this  represents calcium as  ?the structure is bright and moves with the leaflet. There is no  ?significant regurgitation or destruction of  ?the valve to suggest endocarditis. Also could represent papillary  ?fibroelastoma. Would recommend a TEE for better characterization. The  ?aortic valve is tricuspid. Aortic valve regurgitation is not visualized.  ?No aortic stenosis is present.  ? 2. Left ventricular ejection fraction, by estimation, is 65 to 70%. The  ?left ventricle has normal function. The left ventricle has no regional  ?wall motion abnormalities. Left ventricular diastolic parameters were  ?normal. The average left ventricular  ?global longitudinal strain is -19.7 %. The global longitudinal strain is  ?normal.  ? 3. Right ventricular systolic function is normal. The right ventricular  ?size is normal. Tricuspid regurgitation signal is inadequate for assessing  ?PA pressure.  ? 4. The mitral valve is grossly normal. No evidence of mitral valve  ?regurgitation. No evidence of mitral stenosis.  ? 5. The inferior vena cava is normal in size with greater than 50%  ?respiratory variability, suggesting right atrial pressure of 3 mmHg.  ? ? ? ?ASSESSMENT:   ? ?1. Aortic valve mass   ?2. HYPERTENSION, BENIGN ESSENTIAL   ? ? ? ?PLAN:  ?In order of problems listed above: ? ?Aortic valve mass ?-2D echo showed a 0.6 x 0.5cm bright mass on the non coronary cusp of the AV with no AI and possible representing a papillary fibroelastoma or calcified cups ?-she is symptomatic ?-no hx of IVDA ?-TEE in  2021 demonstrated no evidence of papillary fibroblastoma or vegetation.  There was Lambl excrescences which is a normal finding and no AI or AAS. ? ?2.  HTN ?-BP is well controlled on exam today ?-Continue prescription d

## 2021-06-05 NOTE — Patient Instructions (Signed)
Medication Instructions:  ?Your physician recommends that you continue on your current medications as directed. Please refer to the Current Medication list given to you today. ? ?*If you need a refill on your cardiac medications before your next appointment, please call your pharmacy* ? ? ?Follow-Up: ?At [facility], you and your health needs are our priority.  As part of our continuing mission to provide you with exceptional heart care, we have created designated Provider Care Teams.  These Care Teams include your primary Cardiologist (physician) and Advanced Practice Providers (APPs -  Physician Assistants and Nurse Practitioners) who all work together to provide you with the care you need, when you need it. ? ?Follow up with Dr. Radford Pax as needed.  ? ?Important Information About Sugar ? ? ? ? ?  ?

## 2021-07-01 ENCOUNTER — Encounter: Payer: Self-pay | Admitting: *Deleted

## 2021-10-21 ENCOUNTER — Telehealth: Payer: Self-pay

## 2021-10-21 NOTE — Progress Notes (Signed)
    SUBJECTIVE:   CHIEF COMPLAINT / HPI:   Connie Wade is a 66 y.o. female who presents to the [facility] clinic today to discuss the following concerns:   Insect Bites Reports she was bitten by several ants while taking the trash out.  No notes that her right ankle is swollen and the itching is "unbearable".  Has been using an over-the-counter anti-itch ointment without relief.  No fevers, chills, drainage.  Has tried cold compresses.  PERTINENT  PMH / PSH: Hypertension, HIV, HLD  OBJECTIVE:   There were no vitals taken for this visit. ***  General: NAD, pleasant, able to participate in exam Respiratory: Normal work of breathing on room air Skin: *** Psych: Normal affect and mood  ASSESSMENT/PLAN:   No problem-specific Assessment & Plan notes found for this encounter.     Sharion Settler, [place]

## 2021-10-21 NOTE — Patient Instructions (Addendum)
It was wonderful to see you today.  Today we talked about:  -Use ice packs to your ankle to help with the swelling -You can take Tylenol or Ibuprofen for pain  -I am sending in a topical steroid cream that you can use as needed for the itchiness and discomfort. Use up to twice a day, do not use long term as it can cause your skin to lighten and can thin your skin --You are overdue for breast cancer screening, I have ordered a mammogram.  You will need to call to schedule appointment at your earliest convenience. Their number is [phone].   Reasons to return: Fevers, increased swelling, increased pain or streaking of redness.  Thank you for choosing [facility].   Please call [phone] with any questions about today's appointment.  Please be sure to schedule follow up at the front  desk before you leave today.   Sabino Dick, DO PGY-3 Family Medicine

## 2021-10-21 NOTE — Telephone Encounter (Signed)
Patient calls nurse line requesting an apt for ant bites.   Patient reports she was bitten by several ants taking the trash out. Patient reports black ants vs red ants.   Patient reports her right ankle is swollen and the itching is "unbearable." Patient reports she has been using "anti itch" ointment with no relief.   Patient denies any fevers or chills. No drainage from bites.   Patient advised she can try cold compressed to help with swelling. Patient advised she can use Benadryl, however advised this can cause drowsiness.   Patient scheduled for tomorrow for evaluation.   Red flags discussed.

## 2021-10-22 ENCOUNTER — Ambulatory Visit (INDEPENDENT_AMBULATORY_CARE_PROVIDER_SITE_OTHER): Payer: Medicare Other | Admitting: Family Medicine

## 2021-10-22 ENCOUNTER — Encounter: Payer: Self-pay | Admitting: Family Medicine

## 2021-10-22 VITALS — BP 125/75 | HR 81 | Temp 99.1°F | Wt 103.4 lb

## 2021-10-22 DIAGNOSIS — Z23 Encounter for immunization: Secondary | ICD-10-CM | POA: Diagnosis not present

## 2021-10-22 DIAGNOSIS — Z0000 Encounter for general adult medical examination without abnormal findings: Secondary | ICD-10-CM | POA: Diagnosis not present

## 2021-10-22 DIAGNOSIS — S90561A Insect bite (nonvenomous), right ankle, initial encounter: Secondary | ICD-10-CM | POA: Diagnosis not present

## 2021-10-22 DIAGNOSIS — Z1231 Encounter for screening mammogram for malignant neoplasm of breast: Secondary | ICD-10-CM

## 2021-10-22 DIAGNOSIS — W57XXXA Bitten or stung by nonvenomous insect and other nonvenomous arthropods, initial encounter: Secondary | ICD-10-CM | POA: Diagnosis not present

## 2021-10-22 HISTORY — DX: Bitten or stung by nonvenomous insect and other nonvenomous arthropods, initial encounter: W57.XXXA

## 2021-10-22 HISTORY — DX: Encounter for general adult medical examination without abnormal findings: Z00.00

## 2021-10-22 MED ORDER — HYDROCORTISONE 2.5 % EX OINT: TOPICAL_OINTMENT | Freq: Two times a day (BID) | CUTANEOUS | 0 refills | Status: DC

## 2021-10-22 NOTE — Assessment & Plan Note (Signed)
S/p ant bite 2 days ago. With residual swelling and some erythema noted to lateral ankle without skin breaks. Good ROM, normal strength and gait. Reports swelling and erythema have decreased since bite. Only with minimal discomfort. Will continue with conservative measures, no need for imaging or abx at this time. -Ice ankle -Can use Tylenol/Ibuprofen PRN for pain -Hydrocortisone ointment PRN for itchiness -Strict return precautions provided

## 2021-10-22 NOTE — Assessment & Plan Note (Signed)
Received influenza and pneumonia vaccines today. Overdue for mammogram. I have ordered mammogram and patient is aware to call and schedule- number provided on AVS.

## 2021-11-17 ENCOUNTER — Ambulatory Visit
Admission: RE | Admit: 2021-11-17 | Discharge: 2021-11-17 | Disposition: A | Discharge status: Home / Self Care | Payer: Medicare Other | Source: Ambulatory Visit | Attending: Family Medicine | Admitting: Family Medicine

## 2021-11-17 DIAGNOSIS — Z1231 Encounter for screening mammogram for malignant neoplasm of breast: Secondary | ICD-10-CM

## 2022-01-01 ENCOUNTER — Telehealth: Payer: Self-pay

## 2022-01-01 NOTE — Telephone Encounter (Signed)
Patient calls nurse line for multiple concerns.  Patient reports that she had several episodes of vomiting last night. She has not had any episodes today, however, continues to be nauseous. She is able to tolerate fluids.  She has been having muscle twitching in right arm for approx 1 month.  She reports intermittent discomfort in chest from vomiting. She is not having any difficulty breathing, radiating pain or pressure. She states, "it just feels like my chest muscles are irritated from the vomiting last night." Scheduled appointment for tomorrow morning. ED precautions/red flags discussed.   Talbot Grumbling, RN

## 2022-01-02 ENCOUNTER — Ambulatory Visit (INDEPENDENT_AMBULATORY_CARE_PROVIDER_SITE_OTHER): Payer: Medicare Other | Admitting: Family Medicine

## 2022-01-02 VITALS — BP 128/68 | HR 94 | Ht 61.0 in | Wt 101.8 lb

## 2022-01-02 DIAGNOSIS — A084 Viral intestinal infection, unspecified: Secondary | ICD-10-CM | POA: Diagnosis not present

## 2022-01-02 DIAGNOSIS — M62838 Other muscle spasm: Secondary | ICD-10-CM

## 2022-01-02 DIAGNOSIS — R112 Nausea with vomiting, unspecified: Secondary | ICD-10-CM

## 2022-01-02 HISTORY — DX: Viral intestinal infection, unspecified: A08.4

## 2022-01-02 MED ORDER — ONDANSETRON HCL 4 MG PO TABS: 4.0000 mg | ORAL_TABLET | Freq: Three times a day (TID) | ORAL | 0 refills | Status: AC | PRN

## 2022-01-02 NOTE — Patient Instructions (Addendum)
It was great seeing you today!  Today we discussed your symptoms, it seems that you may have had a viral GI bug. This should improve over time, I am glad that you are able to tolerate fluids. Please make sure that you stay hydrated. I have prescribed a short course of zofran to help with the nausea that you can take as needed. If you are not longer able to tolerate fluids and get dehydrated then please go to the emergency department.   Regarding your arm, your exam looked great. This may be in response to the cold weather. We can just monitor it and if it is still ongoing after a few months or getting worse then please follow up with your PCP.   Please follow up at your next scheduled appointment, if anything arises between now and then, please don't hesitate to contact our office.   Thank you for allowing [place] to be a part of your medical care!  Thank you, Dr. Larae Grooms  Also a reminder of our clinic's no-show policy. Please make sure to arrive at least 15 minutes prior to your scheduled appointment time. Please try to cancel before 24 hours if you are not able to make it. If you no-show for 2 appointments then you will be receiving a warning letter. If you no-show after 3 visits, then you may be at risk of being dismissed from our clinic. This is to ensure that everyone is able to be seen in a timely manner. Thank you, we appreciate your assistance with this!

## 2022-01-02 NOTE — Assessment & Plan Note (Addendum)
-  likely secondary to viral etiology, reassuringly this is improving since symptoms started and maintaining appropriate intake  -short course zofran prescribed to be used as needed -ED precautions discussed

## 2022-01-02 NOTE — Assessment & Plan Note (Signed)
-  unsure of exact etiology, no tremors or spasms noted upon exam. Likely benign and secondary to cold. Neurological exam unremarkable with cerebellar testing within normal limits. No concern for resting or essential tremor. No concern for stroke.  -follow up in a few months with PCP if symptoms persist or worsen

## 2022-01-02 NOTE — Progress Notes (Signed)
    SUBJECTIVE:   CHIEF COMPLAINT / HPI:   Patient presents with symptoms concerning for possible food poisoning, she had chicken and dumplings the night before last. Later that night, she started to vomit. She had one episode of a lot of vomiting and has not had any since then. She lives by herself so no one else ate the food. Denies sick contacts. Today, she still has not vomited but has been feeling nauseous with abdominal pain. Denies fever or congestion. Last meal she had was that night, since then she has been drinking ginger ale and water. Tolerating fluids. She is scared to eat anything solid at this point.  Also reporting right arm spasms that randomly will just start shaking. Usually occurs when she is resting. When she sits at the computer, she is in a certain position and it causes it to spasm. Has been ongoing for the past month. Denies pain, numbness, tingling or other associated symptoms.   OBJECTIVE:   BP 128/68   Pulse 94   Ht '5\' 1"'$  (1.549 m)   Wt 101 lb 12.8 oz (46.2 kg)   SpO2 100%   BMI 19.23 kg/m   General: Patient tired appearing, in no acute distress. HEENT: moist mucous membranes, no evidence of cervical LAD CV: RRR, no murmurs or gallops auscultated Resp: CTAB, no wheezing, rales or rhonchi noted Abdomen: soft, generalized tenderness upon deep palpation, nondistended, presence of bowel sounds  Neuro: 5/5 UE and LE strength bilaterally, 5/5 interosseous strength, 2+ biceps and brachioradialis reflexes bilaterally, gross sensation intact, normal finger to nose testing, normal gait   ASSESSMENT/PLAN:   Viral gastroenteritis -likely secondary to viral etiology, reassuringly this is improving since symptoms started and maintaining appropriate intake  -short course zofran prescribed to be used as needed -ED precautions discussed   Muscle spasm -unsure of exact etiology, no tremors or spasms noted upon exam. Likely benign and secondary to cold. Neurological exam  unremarkable with cerebellar testing within normal limits. No concern for resting or essential tremor. No concern for stroke.  -follow up in a few months with PCP if symptoms persist or worsen    Kendel Pesnell Larae Grooms, [place]

## 2022-01-09 ENCOUNTER — Other Ambulatory Visit: Payer: Self-pay | Admitting: Family Medicine

## 2022-01-10 ENCOUNTER — Other Ambulatory Visit: Payer: Self-pay | Admitting: Family Medicine

## 2022-02-25 ENCOUNTER — Ambulatory Visit (INDEPENDENT_AMBULATORY_CARE_PROVIDER_SITE_OTHER): Payer: Medicare Other

## 2022-02-25 VITALS — Ht 61.0 in | Wt 101.0 lb

## 2022-02-25 DIAGNOSIS — Z0110 Encounter for examination of ears and hearing without abnormal findings: Secondary | ICD-10-CM

## 2022-02-25 DIAGNOSIS — Z135 Encounter for screening for eye and ear disorders: Secondary | ICD-10-CM

## 2022-02-25 DIAGNOSIS — Z1211 Encounter for screening for malignant neoplasm of colon: Secondary | ICD-10-CM

## 2022-02-25 DIAGNOSIS — Z122 Encounter for screening for malignant neoplasm of respiratory organs: Secondary | ICD-10-CM

## 2022-02-25 DIAGNOSIS — Z0000 Encounter for general adult medical examination without abnormal findings: Secondary | ICD-10-CM | POA: Diagnosis not present

## 2022-02-25 NOTE — Patient Instructions (Signed)
Connie Wade , Thank you for taking time to come for your Medicare Wellness Visit. I appreciate your ongoing commitment to your health goals. Please review the following plan we discussed and let me know if I can assist you in the future.   These are the goals we discussed:  Goals      Quit smoking / using tobacco        This is a list of the screening recommended for you and due dates:  Health Maintenance  Topic Date Due   Zoster (Shingles) Vaccine (1 of 2) Never done   COVID-19 Vaccine (5 - 2023-24 season) 09/26/2021   Colon Cancer Screening  11/13/2021   Medicare Annual Wellness Visit  02/26/2023   Mammogram  11/18/2023   DTaP/Tdap/Td vaccine (3 - Td or Tdap) 10/20/2027   Pneumonia Vaccine  Completed   Flu Shot  Completed   DEXA scan (bone density measurement)  Completed   Hepatitis C Screening: USPSTF Recommendation to screen - Ages 69-79 yo.  Completed   HPV Vaccine  Aged Out    Advanced directives: no  Conditions/risks identified: none  Next appointment: Follow up in one year for your annual wellness visit TBS   Preventive Care 65 Years and Older, Female Preventive care refers to lifestyle choices and visits with your health care provider that can promote health and wellness. What does preventive care include? A yearly physical exam. This is also called an annual well check. Dental exams once or twice a year. Routine eye exams. Ask your health care provider how often you should have your eyes checked. Personal lifestyle choices, including: Daily care of your teeth and gums. Regular physical activity. Eating a healthy diet. Avoiding tobacco and drug use. Limiting alcohol use. Practicing safe sex. Taking low-dose aspirin every day. Taking vitamin and mineral supplements as recommended by your health care provider. What happens during an annual well check? The services and screenings done by your health care provider during your annual well check will depend on your  age, overall health, lifestyle risk factors, and family history of disease. Counseling  Your health care provider may ask you questions about your: Alcohol use. Tobacco use. Drug use. Emotional well-being. Home and relationship well-being. Sexual activity. Eating habits. History of falls. Memory and ability to understand (cognition). Work and work Statistician. Reproductive health. Screening  You may have the following tests or measurements: Height, weight, and BMI. Blood pressure. Lipid and cholesterol levels. These may be checked every 5 years, or more frequently if you are over 63 years old. Skin check. Lung cancer screening. You may have this screening every year starting at age 32 if you have a 30-pack-year history of smoking and currently smoke or have quit within the past 15 years. Fecal occult blood test (FOBT) of the stool. You may have this test every year starting at age 38. Flexible sigmoidoscopy or colonoscopy. You may have a sigmoidoscopy every 5 years or a colonoscopy every 10 years starting at age 43. Hepatitis C blood test. Hepatitis B blood test. Sexually transmitted disease (STD) testing. Diabetes screening. This is done by checking your blood sugar (glucose) after you have not eaten for a while (fasting). You may have this done every 1-3 years. Bone density scan. This is done to screen for osteoporosis. You may have this done starting at age 34. Mammogram. This may be done every 1-2 years. Talk to your health care provider about how often you should have regular mammograms. Talk with your health  care provider about your test results, treatment options, and if necessary, the need for more tests. Vaccines  Your health care provider may recommend certain vaccines, such as: Influenza vaccine. This is recommended every year. Tetanus, diphtheria, and acellular pertussis (Tdap, Td) vaccine. You may need a Td booster every 10 years. Zoster vaccine. You may need this after  age 27. Pneumococcal 13-valent conjugate (PCV13) vaccine. One dose is recommended after age 56. Pneumococcal polysaccharide (PPSV23) vaccine. One dose is recommended after age 63. Talk to your health care provider about which screenings and vaccines you need and how often you need them. This information is not intended to replace advice given to you by your health care provider. Make sure you discuss any questions you have with your health care provider. Document Released: 02/08/2015 Document Revised: 10/02/2015 Document Reviewed: 11/13/2014 Elsevier Interactive Patient Education  2017 [place] Prevention in the Home Falls can cause injuries. They can happen to people of all ages. There are many things you can do to make your home safe and to help prevent falls. What can I do on the outside of my home? Regularly fix the edges of walkways and driveways and fix any cracks. Remove anything that might make you trip as you walk through a door, such as a raised step or threshold. Trim any bushes or trees on the path to your home. Use bright outdoor lighting. Clear any walking paths of anything that might make someone trip, such as rocks or tools. Regularly check to see if handrails are loose or broken. Make sure that both sides of any steps have handrails. Any raised decks and porches should have guardrails on the edges. Have any leaves, snow, or ice cleared regularly. Use sand or salt on walking paths during winter. Clean up any spills in your garage right away. This includes oil or grease spills. What can I do in the bathroom? Use night lights. Install grab bars by the toilet and in the tub and shower. Do not use towel bars as grab bars. Use non-skid mats or decals in the tub or shower. If you need to sit down in the shower, use a plastic, non-slip stool. Keep the floor dry. Clean up any water that spills on the floor as soon as it happens. Remove soap buildup in the tub or shower  regularly. Attach bath mats securely with double-sided non-slip rug tape. Do not have throw rugs and other things on the floor that can make you trip. What can I do in the bedroom? Use night lights. Make sure that you have a light by your bed that is easy to reach. Do not use any sheets or blankets that are too big for your bed. They should not hang down onto the floor. Have a firm chair that has side arms. You can use this for support while you get dressed. Do not have throw rugs and other things on the floor that can make you trip. What can I do in the kitchen? Clean up any spills right away. Avoid walking on wet floors. Keep items that you use a lot in easy-to-reach places. If you need to reach something above you, use a strong step stool that has a grab bar. Keep electrical cords out of the way. Do not use floor polish or wax that makes floors slippery. If you must use wax, use non-skid floor wax. Do not have throw rugs and other things on the floor that can make you trip. What can  I do with my stairs? Do not leave any items on the stairs. Make sure that there are handrails on both sides of the stairs and use them. Fix handrails that are broken or loose. Make sure that handrails are as long as the stairways. Check any carpeting to make sure that it is firmly attached to the stairs. Fix any carpet that is loose or worn. Avoid having throw rugs at the top or bottom of the stairs. If you do have throw rugs, attach them to the floor with carpet tape. Make sure that you have a light switch at the top of the stairs and the bottom of the stairs. If you do not have them, ask someone to add them for you. What else can I do to help prevent falls? Wear shoes that: Do not have high heels. Have rubber bottoms. Are comfortable and fit you well. Are closed at the toe. Do not wear sandals. If you use a stepladder: Make sure that it is fully opened. Do not climb a closed stepladder. Make sure that  both sides of the stepladder are locked into place. Ask someone to hold it for you, if possible. Clearly mark and make sure that you can see: Any grab bars or handrails. First and last steps. Where the edge of each step is. Use tools that help you move around (mobility aids) if they are needed. These include: Canes. Walkers. Scooters. Crutches. Turn on the lights when you go into a dark area. Replace any light bulbs as soon as they burn out. Set up your furniture so you have a clear path. Avoid moving your furniture around. If any of your floors are uneven, fix them. If there are any pets around you, be aware of where they are. Review your medicines with your doctor. Some medicines can make you feel dizzy. This can increase your chance of falling. Ask your doctor what other things that you can do to help prevent falls. This information is not intended to replace advice given to you by your health care provider. Make sure you discuss any questions you have with your health care provider. Document Released: 11/08/2008 Document Revised: 06/20/2015 Document Reviewed: 02/16/2014 Elsevier Interactive Patient Education  2017 [employer].

## 2022-02-25 NOTE — Progress Notes (Signed)
Virtual Visit via Telephone Note  I connected with  Connie Wade on 02/25/22 at  9:45 AM EST by telephone and verified that I am speaking with the correct person using two identifiers.  Location: Patient: home Provider: [facility] Persons participating in the virtual visit: [place]   I discussed the limitations, risks, security and privacy concerns of performing an evaluation and management service by telephone and the availability of in person appointments. The patient expressed understanding and agreed to proceed.  Interactive audio and video telecommunications were attempted between this nurse and patient, however failed, due to patient having technical difficulties OR patient did not have access to video capability.  We continued and completed visit with audio only.  Some vital signs may be absent or patient reported.   Roger Shelter, LPN  Subjective:   Connie Wade is a 67 y.o. female who presents for an Initial Medicare Annual Wellness Visit.  Review of Systems    Cardiac Risk Factors include: advanced age (>92mn, >>29women);hypertension     Objective:    Today's Vitals   02/25/22 1001  Weight: 101 lb (45.8 kg)  Height: '5\' 1"'$  (1.549 m)   Body mass index is 19.08 kg/m.     02/25/2022   10:15 AM 01/02/2022    8:35 AM 10/22/2021   11:46 AM 04/07/2021   10:46 AM 03/11/2021    9:11 AM 02/18/2021   11:37 AM 07/26/2020    1:57 PM  Advanced Directives  Does Patient Have a Medical Advance Directive? No No No No No No No  Would patient like information on creating a medical advance directive? Yes (ED - Information included in AVS) No - Patient declined  No - Patient declined Yes (MAU/Ambulatory/Procedural Areas - Information given) No - Patient declined No - Patient declined    Current Medications (verified) Outpatient Encounter Medications as of 02/25/2022  Medication Sig   acetaminophen (TYLENOL) 500 MG tablet Take 2 tablets (1,000  mg total) by mouth every 8 (eight) hours as needed.   fluticasone (FLONASE) 50 MCG/ACT nasal spray SPRAY 2 SPRAYS INTO EACH NOSTRIL EVERY DAY   lisinopril (ZESTRIL) 20 MG tablet TAKE 1 TABLET BY MOUTH EVERY DAY   Naphazoline-Pheniramine (OPCON-A) 0.027-0.315 % SOLN Place 1-2 drops into both eyes 3 (three) times daily as needed (allergy eyes).   diclofenac (FLECTOR) 1.3 % PTCH Place 1 patch onto the skin 2 (two) times daily. Over right hip. (Patient not taking: Reported on 02/25/2022)   gabapentin (NEURONTIN) 300 MG capsule Take 1 capsule (300 mg total) by mouth 2 (two) times daily as needed (nerve pain (arm)). (Patient not taking: Reported on 10/22/2021)   hydrocortisone 2.5 % ointment Apply topically 2 (two) times daily. (Patient not taking: Reported on 02/25/2022)   No facility-administered encounter medications on file as of 02/25/2022.    Allergies (verified) Asa [aspirin], Nsaids, and Shrimp [shellfish allergy]   History: Past Medical History:  Diagnosis Date   ANEMIA, OTHER, UNSPECIFIED 03/25/2006   Bright red blood per rectum 11/30/2019   DEPRESSION 03/17/2007   Enteritis 07/28/2019   Gout 04/27/2016   HEPATITIS B 03/25/2006   HIV 03/25/2006   since 2000   HYDRADENITIS 03/25/2006   HYPERLIPIDEMIA 03/25/2006   HYPERTENSION, BENIGN ESSENTIAL 04/26/2007   MENOPAUSAL SYNDROME 03/25/2006   Qualifier: Diagnosis of  By: DDamita DunningsMD, [place]     Neck pain 07/13/2018   TOBACCO DEPENDENCE 03/25/2006   Tubular adenoma of colon 06/23/2011   Benign, no high grade dysplasia  06/16/11 Followed by [name] Dr. Watt Climes Repeat in 5 years (due 2018)   Tubular adenoma of colon 06/23/2011   Benign, no high grade dysplasia 06/16/11 Followed by [name] Dr. Watt Climes Repeat in 5 years (due 2018)   UTERINE FIBROID 03/25/2006   Past Surgical History:  Procedure Laterality Date   BREAST LUMPECTOMY Bilateral    coil     anuerysm   IR GENERIC HISTORICAL  07/26/2015   IR ANGIO INTRA EXTRACRAN SEL INTERNAL CAROTID BILAT MOD SED  07/26/2015 Consuella Lose, MD [facility]   IR GENERIC HISTORICAL  07/26/2015   IR ANGIO VERTEBRAL SEL VERTEBRAL BILAT MOD SED 07/26/2015 Consuella Lose, MD [facility]   OVARIAN CYST REMOVAL  2001   RADIOLOGY WITH ANESTHESIA N/A 12/05/2014   Procedure: RADIOLOGY WITH ANESTHESIA-COING FOR SUBARACHNOID HEMORRHAGE;  Surgeon: Consuella Lose, MD;  Location: [facility];  Service: Radiology;  Laterality: N/A;   TEE WITHOUT CARDIOVERSION N/A 01/23/2020   Procedure: TRANSESOPHAGEAL ECHOCARDIOGRAM (TEE);  Surgeon: Sueanne Margarita, MD;  Location: [facility] ENDOSCOPY;  Service: Cardiovascular;  Laterality: N/A;   Family History  Problem Relation Age of Onset   COPD Sister    Arthritis Mother    Alcohol abuse Father    Social History   Socioeconomic History   Marital status: Widowed    Spouse name: Not on file   Number of children: Not on file   Years of education: Not on file   Highest education level: Not on file  Occupational History   Not on file  Tobacco Use   Smoking status: Former    Packs/day: 1.00    Types: Cigarettes    Quit date: 12/17/2014    Years since quitting: 7.1   Smokeless tobacco: Never  Substance and Sexual Activity   Alcohol use: No    Alcohol/week: 1.0 standard drink of alcohol    Types: 1 Glasses of wine per week    Comment: EVERYDAY, three beers today   Drug use: Yes    Types: Marijuana   Sexual activity: Not Currently    Partners: Male  Other Topics Concern   Not on file  Social History Narrative   Not on file   Social Determinants of Health   Financial Resource Strain: Low Risk  (02/25/2022)   Overall Financial Resource Strain (CARDIA)    Difficulty of Paying Living Expenses: Not hard at all  Food Insecurity: No Food Insecurity (02/25/2022)   Hunger Vital Sign    Worried About Running Out of Food in the Last Year: Never true    Ran Out of Food in the Last Year: Never true  Transportation Needs: No Transportation Needs (02/25/2022)   PRAPARE -  Hydrologist (Medical): No    Lack of Transportation (Non-Medical): No  Physical Activity: Insufficiently Active (02/25/2022)   Exercise Vital Sign    Days of Exercise per Week: 4 days    Minutes of Exercise per Session: 30 min  Stress: No Stress Concern Present (02/25/2022)   [place]    Feeling of Stress : Only a little  Social Connections: Moderately Isolated (02/25/2022)   Social Connection and Isolation Panel [NHANES]    Frequency of Communication with Friends and Family: Twice a week    Frequency of Social Gatherings with Friends and Family: Twice a week    Attends Religious Services: More than 4 times per year    Active Member of Clubs or Organizations: No  Attends Archivist Meetings: Never    Marital Status: Widowed    Tobacco Counseling Counseling given: Not Answered   Clinical Intake:  Pre-visit preparation completed: Yes  Pain : No/denies pain     BMI - recorded: 19.08 Nutritional Status: BMI of 19-24  Normal Nutritional Risks: None Diabetes: No  How often do you need to have someone help you when you read instructions, pamphlets, or other written materials from your doctor or pharmacy?: 1 - Never  Diabetic?no Interpreter Needed?: No Information entered by :: B.Shawni Volkov,LPN  Activities of Daily Living    02/25/2022   10:15 AM  In your present state of health, do you have any difficulty performing the following activities:  Hearing? 1  Vision? 0  Difficulty concentrating or making decisions? 0  Walking or climbing stairs? 0  Dressing or bathing? 0  Doing errands, shopping? 0  Preparing Food and eating ? N  Using the Toilet? N  In the past six months, have you accidently leaked urine? N  Do you have problems with loss of bowel control? N  Managing your Medications? N  Managing your Finances? N  Housekeeping or managing your Housekeeping? N     Patient Care Team: Rise Patience, DO as PCP - General (Family Medicine) Tommy Medal, Lavell Islam, MD as PCP - Infectious Diseases (Infectious Diseases) Sueanne Margarita, MD as PCP - Cardiology (Cardiology)  Indicate any recent Medical Services you may have received from other than [facility] providers in the past year (date may be approximate).     Assessment:   This is a routine wellness examination for Connie Wade.  Hearing/Vision screen Hearing Screening - Comments:: Pt says she does not hear well needs test Vision Screening - Comments:: Wears glasses Dr Liana Gerold MD 2 years Vision adequate  Dietary issues and exercise activities discussed: Current Exercise Habits: Home exercise routine, Type of exercise: walking, Time (Minutes): 30, Frequency (Times/Week): 4, Weekly Exercise (Minutes/Week): 120, Intensity: Mild, Exercise limited by: None identified   Goals Addressed             This Visit's Progress    Quit smoking / using tobacco   On track      Depression Screen    02/25/2022   10:10 AM 01/02/2022    8:36 AM 10/22/2021   11:45 AM 04/07/2021   10:45 AM 02/18/2021   11:40 AM 07/26/2020    1:59 PM 06/03/2020    2:13 PM  PHQ 2/9 Scores  PHQ - 2 Score 0 1 1 0 '1 3 6  '$ PHQ- 9 Score   '4 2 5 6 15    '$ Fall Risk    02/25/2022   10:05 AM 01/02/2022    8:36 AM 10/22/2021   11:45 AM 04/07/2021   10:45 AM 07/26/2020    2:00 PM  Fall Risk   Falls in the past year? 0 0 0 0 0  Number falls in past yr: 0   0 0  Injury with Fall? 0   0 0  Risk for fall due to : No Fall Risks      Follow up Education provided;Falls prevention discussed        FALL RISK PREVENTION PERTAINING TO THE HOME:  Any stairs in or around the home? Yes  If so, are there any without handrails? Yes  Home free of loose throw rugs in walkways, pet beds, electrical cords, etc? Yes  Adequate lighting in your home to reduce risk of falls? Yes  ASSISTIVE DEVICES UTILIZED TO PREVENT FALLS:  Life alert? No  Use of a  cane, walker or w/c? No  Grab bars in the bathroom? No  Shower chair or bench in shower? No  Elevated toilet seat or a handicapped toilet? No    Cognitive Function:        02/25/2022   10:18 AM  6CIT Screen  What Year? 0 points  What month? 0 points  What time? 0 points  Count back from 20 0 points  Months in reverse 2 points  Repeat phrase 2 points  Total Score 4 points        Immunizations Immunization History  Administered Date(s) Administered   Fluad Quad(high Dose 65+) 12/11/2019, 04/07/2021, 10/22/2021   Influenza Split 11/16/2011   Influenza Whole 02/25/2004, 04/23/2010   Influenza, Seasonal, Injecte, Preservative Fre 11/01/2012   Influenza,inj,Quad PF,6+ Mos 10/17/2013, 12/24/2014, 01/17/2016, 10/19/2017, 12/01/2018   Moderna Sars-Covid-2 Vaccination 04/11/2019   PFIZER Comirnaty(Gray Top)Covid-19 Tri-Sucrose Vaccine 01/06/2021   PFIZER(Purple Top)SARS-COV-2 Vaccination 03/09/2019, 12/11/2019   PNEUMOCOCCAL CONJUGATE-20 10/22/2021   Pneumococcal Conjugate-13 12/01/2018   Pneumococcal Polysaccharide-23 02/27/2004, 04/23/2010   Td 03/17/2007   Tdap 10/19/2017    TDAP status: Up to date  Flu Vaccine status: Up to date  Pneumococcal vaccine status: Up to date  Covid-19 vaccine status: Completed vaccines  Qualifies for Shingles Vaccine? Yes   Zostavax completed No   Shingrix Completed?: No.    Education has been provided regarding the importance of this vaccine. Patient has been advised to call insurance company to determine out of pocket expense if they have not yet received this vaccine. Advised may also receive vaccine at local pharmacy or Health Dept. Verbalized acceptance and understanding.  Screening Tests Health Maintenance  Topic Date Due   Zoster Vaccines- Shingrix (1 of 2) Never done   COVID-19 Vaccine (5 - 2023-24 season) 09/26/2021   COLONOSCOPY (Pts 45-74yr Insurance coverage will need to be confirmed)  11/13/2021   Medicare Annual  Wellness (AWV)  02/26/2023   MAMMOGRAM  11/18/2023   DTaP/Tdap/Td (3 - Td or Tdap) 10/20/2027   Pneumonia Vaccine 67 Years old  Completed   INFLUENZA VACCINE  Completed   DEXA SCAN  Completed   Hepatitis C Screening  Completed   HPV VACCINES  Aged Out    Health Maintenance  Health Maintenance Due  Topic Date Due   Zoster Vaccines- Shingrix (1 of 2) Never done   COVID-19 Vaccine (5 - 2023-24 season) 09/26/2021   COLONOSCOPY (Pts 45-445yrInsurance coverage will need to be confirmed)  11/13/2021    Colorectal cancer screening: Referral to GI placed yes. Pt aware the office will call re: appt.  Mammogram status: Completed yes. Repeat every year  Bone Density status: Completed yes. Results reflect: Bone density results: NORMAL. Repeat every 5 years.  Lung Cancer Screening: (Low Dose CT Chest recommended if Age 69-80ears, 30 pack-year currently smoking OR have quit w/in 15years.) does qualify.   Lung Cancer Screening Referral: yes  Additional Screening:  Hepatitis C Screening: does not qualify; Completed no  Vision Screening: Recommended annual ophthalmology exams for early detection of glaucoma and other disorders of the eye. Is the patient up to date with their annual eye exam?  No  Dr McEinar Giput of network now Who is the provider or what is the name of the office in which the patient attends annual eye exams? Non desires referral If pt is not established with a provider, would they like to be referred  to a provider to establish care? Yes .   Dental Screening: Recommended annual dental exams for proper oral hygiene  Community Resource Referral / Chronic Care Management: CRR required this visit?  No   CCM required this visit?  No      Plan:     I have personally reviewed and noted the following in the patient's chart:   Medical and social history Use of alcohol, tobacco or illicit drugs  Current medications and supplements including opioid prescriptions.  Patient is not currently taking opioid prescriptions. Functional ability and status Nutritional status Physical activity Advanced directives List of other physicians Hospitalizations, surgeries, and ER visits in previous 12 months Vitals Screenings to include cognitive, depression, and falls Referrals and appointments  In addition, I have reviewed and discussed with patient certain preventive protocols, quality metrics, and best practice recommendations. A written personalized care plan for preventive services as well as general preventive health recommendations were provided to patient.     Roger Shelter, LPN   9/35/7017   Nurse Notes: pt has concerns with nocturnal urinary frequency(up 4 times at least). Pt also states she does not hear well and needs a hearing test. Referral for Lung ca screening and colonoscopy placed. Pt expresses need for referral to new ophthalmologist.  Please review the Medications, Problem List, Past Medical and Surgical Histories as well as Family and Social Histories.

## 2022-03-11 ENCOUNTER — Ambulatory Visit: Payer: Medicare Other | Attending: Audiologist | Admitting: Audiologist

## 2022-03-11 DIAGNOSIS — H903 Sensorineural hearing loss, bilateral: Secondary | ICD-10-CM | POA: Diagnosis not present

## 2022-03-11 NOTE — Procedures (Signed)
  Outpatient Audiology and [place], [place]  [id number] [phone]  AUDIOLOGICAL  EVALUATION  NAME: Connie Wade     DOB:   1955-03-01      MRN: [id number]                                                                                     DATE: 03/11/2022     REFERENT: Rise Patience, DO STATUS: Outpatient DIAGNOSIS: Sensorineural Hearing Loss Bilaterally    History: Connie Wade was seen for an audiological evaluation. Connie Wade is receiving a hearing evaluation due to concerns for difficulty hearing on occasion. Connie Wade has difficulty hearing in background noise and when people are far away. This difficulty began gradually. No pain or pressure reported in either ear. Tinnitus intermittent in both ears. Connie Wade has a history of noise exposure from working in the [employer] when young.  Medical history negative for a health condition which is a risk factor for hearing loss. No other relevant case history reported.   Evaluation:  Otoscopy showed a clear view of the tympanic membranes, bilaterally Tympanometry results were consistent with normal middle ear function, bilaterally   Audiometric testing was completed using conventional audiometry with supraural transducer. Speech Recognition Thresholds were 35 dB in the right ear and 25 dB in the left ear. Word Recognition was  performed 40 dB SL, scored 100% in the right ear and 100% in the left ear. Pure tone thresholds show normal to mild sensorineural hearing loss in the right ear and mild sensorineural hearing loss in the left ear.   Results:  The test results were reviewed with Connie Wade. Connie Wade has a mild sensorineural hearing loss in each ear. Connie Wade has good ability to understand speech. Recommend Connie Wade start monitoring her hearing. Hearing aids are not necessary at this time due to mild degree of loss.    Recommendations: 1.   No further testing necessary at this time. Connie Wade was instructed to  have her hearing tested again at 67 yo to  monitor hearing loss progression, or sooner if changes are perceived.    32 minutes spent testing and counseling on results.   Alfonse Alpers  Audiologist, Au.D., CCC-A 03/11/2022  8:37 AM  Cc: Rise Patience, DO

## 2022-03-12 ENCOUNTER — Other Ambulatory Visit: Payer: Self-pay

## 2022-03-12 ENCOUNTER — Ambulatory Visit (INDEPENDENT_AMBULATORY_CARE_PROVIDER_SITE_OTHER): Payer: Medicare Other | Admitting: Student

## 2022-03-12 VITALS — BP 117/70 | HR 95 | Ht 61.0 in | Wt 103.6 lb

## 2022-03-12 DIAGNOSIS — M542 Cervicalgia: Secondary | ICD-10-CM | POA: Diagnosis not present

## 2022-03-12 MED ORDER — BACLOFEN 10 MG PO TABS: 10.0000 mg | ORAL_TABLET | Freq: Three times a day (TID) | ORAL | 0 refills | Status: DC
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
## 2022-05-12 ENCOUNTER — Telehealth: Payer: Self-pay | Admitting: Family Medicine

## 2022-05-12 NOTE — Telephone Encounter (Signed)
Patient came in stating that she has an MRI scheduled with [facility], and they need a letter from her doctor stating that that she had an aneurysm in 11/2014 that she had surgery on.

## 2022-05-13 ENCOUNTER — Encounter: Payer: Self-pay | Admitting: Family Medicine

## 2022-05-13 NOTE — Telephone Encounter (Signed)
Spoke with pt to let her know that the letter was sent to her mychart but asked her if she would like a copy of it as well. Pt stated that she would like a copy and that she can pick it up tomorrow. Informed pt that the letter will be up front when she gets here tomorrow.

## 2022-06-23 ENCOUNTER — Encounter: Payer: Self-pay | Admitting: Family Medicine

## 2022-07-01 DIAGNOSIS — K5730 Diverticulosis of large intestine without perforation or abscess without bleeding: Secondary | ICD-10-CM | POA: Diagnosis not present

## 2022-07-01 DIAGNOSIS — Z09 Encounter for follow-up examination after completed treatment for conditions other than malignant neoplasm: Secondary | ICD-10-CM | POA: Diagnosis not present

## 2022-07-01 DIAGNOSIS — Z86010 Personal history of colonic polyps: Secondary | ICD-10-CM | POA: Diagnosis not present

## 2022-07-01 DIAGNOSIS — D120 Benign neoplasm of cecum: Secondary | ICD-10-CM | POA: Diagnosis not present

## 2022-07-01 DIAGNOSIS — D123 Benign neoplasm of transverse colon: Secondary | ICD-10-CM | POA: Diagnosis not present

## 2022-07-01 DIAGNOSIS — D124 Benign neoplasm of descending colon: Secondary | ICD-10-CM | POA: Diagnosis not present

## 2022-07-03 DIAGNOSIS — D123 Benign neoplasm of transverse colon: Secondary | ICD-10-CM | POA: Diagnosis not present

## 2022-07-03 DIAGNOSIS — D124 Benign neoplasm of descending colon: Secondary | ICD-10-CM | POA: Diagnosis not present

## 2022-07-03 DIAGNOSIS — D120 Benign neoplasm of cecum: Secondary | ICD-10-CM | POA: Diagnosis not present

## 2022-07-07 ENCOUNTER — Encounter: Payer: Self-pay | Admitting: Family Medicine

## 2022-07-07 NOTE — Telephone Encounter (Signed)
Called patient.   Advised patient to call to hospital to request medical records.   Number given.

## 2022-07-13 ENCOUNTER — Other Ambulatory Visit: Payer: Self-pay

## 2022-07-13 ENCOUNTER — Other Ambulatory Visit (HOSPITAL_COMMUNITY)
Admission: RE | Admit: 2022-07-13 | Discharge: 2022-07-13 | Disposition: A | Discharge status: Home / Self Care | Payer: Medicare Other | Source: Ambulatory Visit | Attending: Infectious Disease | Admitting: Infectious Disease

## 2022-07-13 ENCOUNTER — Ambulatory Visit: Payer: Medicare Other | Admitting: Infectious Disease

## 2022-07-13 ENCOUNTER — Encounter: Payer: Self-pay | Admitting: Infectious Disease

## 2022-07-13 VITALS — BP 137/82 | HR 73 | Temp 98.3°F | Ht 61.0 in | Wt 103.0 lb

## 2022-07-13 DIAGNOSIS — B181 Chronic viral hepatitis B without delta-agent: Secondary | ICD-10-CM

## 2022-07-13 DIAGNOSIS — I10 Essential (primary) hypertension: Secondary | ICD-10-CM

## 2022-07-13 DIAGNOSIS — B20 Human immunodeficiency virus [HIV] disease: Secondary | ICD-10-CM | POA: Diagnosis not present

## 2022-07-13 DIAGNOSIS — E785 Hyperlipidemia, unspecified: Secondary | ICD-10-CM | POA: Diagnosis not present

## 2022-07-13 DIAGNOSIS — Z79899 Other long term (current) drug therapy: Secondary | ICD-10-CM | POA: Diagnosis not present

## 2022-07-13 MED ORDER — ROSUVASTATIN CALCIUM 20 MG PO TABS: 20.0000 mg | ORAL_TABLET | Freq: Every day | ORAL | 11 refills | Status: DC

## 2022-07-13 NOTE — Addendum Note (Signed)
Addended by: Marcell Anger on: 07/13/2022 09:42 AM   Modules accepted: Orders

## 2022-07-13 NOTE — Progress Notes (Signed)
Reason for Infectious Disease Consult: re-establish care for HIV disease    Subjective:    Patient ID: Connie Wade, female    DOB: 04-15-1955, 67 y.o.   MRN: [id number]  HPI  Connie Wade is a 67 year old black woman with a past medical history significant for hypertension, hyperlipidemia HIV disease though her virus has been controlled with her own immune system, i.e. she has a ""ELITE CONTROLLER." She had been in ACTG 5308 but has nto wanted to be on HIV meds since then.   Connie Wade  Past Medical History:  Diagnosis Date   ANEMIA, OTHER, UNSPECIFIED 03/25/2006   Bright red blood per rectum 11/30/2019   DEPRESSION 03/17/2007   Enteritis 07/28/2019   Gout 04/27/2016   HEPATITIS B 03/25/2006   HIV 03/25/2006   since 2000   HYDRADENITIS 03/25/2006   HYPERLIPIDEMIA 03/25/2006   HYPERTENSION, BENIGN ESSENTIAL 04/26/2007   MENOPAUSAL SYNDROME 03/25/2006   Qualifier: Diagnosis of  By: Para March MD, [place]     Neck pain 07/13/2018   TOBACCO DEPENDENCE 03/25/2006   Tubular adenoma of colon 06/23/2011   Benign, no high grade dysplasia 06/16/11 Followed by [facility] Dr. Ewing Schlein Repeat in 5 years (due 2018)   Tubular adenoma of colon 06/23/2011   Benign, no high grade dysplasia 06/16/11 Followed by [facility] Dr. Ewing Schlein Repeat in 5 years (due 2018)   UTERINE FIBROID 03/25/2006    Past Surgical History:  Procedure Laterality Date   BREAST LUMPECTOMY Bilateral    coil     anuerysm   IR GENERIC HISTORICAL  07/26/2015   IR ANGIO INTRA EXTRACRAN SEL INTERNAL CAROTID BILAT MOD SED 07/26/2015 Lisbeth Renshaw, MD [facility]   IR GENERIC HISTORICAL  07/26/2015   IR ANGIO VERTEBRAL SEL VERTEBRAL BILAT MOD SED 07/26/2015 [facility]haw, MD [facility]   OVARIAN CYST REMOVAL  2001   RADIOLOGY WITH ANESTHESIA N/A 12/05/2014   Procedure: RADIOLOGY WITH ANESTHESIA-COING FOR SUBARACHNOID HEMORRHAGE;  Surgeon: Lisbeth Renshaw, MD;  Location: [facility] OR;  Service: Radiology;  Laterality: N/A;   TEE WITHOUT  CARDIOVERSION N/A 01/23/2020   Procedure: TRANSESOPHAGEAL ECHOCARDIOGRAM (TEE);  Surgeon: Quintella Reichert, MD;  Location: [facility] ENDOSCOPY;  Service: Cardiovascular;  Laterality: N/A;    Family History  Problem Relation Age of Onset   COPD Sister    Arthritis Mother    Alcohol abuse Father       Social History   Socioeconomic History   Marital status: Widowed    Spouse name: Not on file   Number of children: Not on file   Years of education: Not on file   Highest education level: Not on file  Occupational History   Not on file  Tobacco Use   Smoking status: Former    Packs/day: 1    Types: Cigarettes    Quit date: 12/17/2014    Years since quitting: 7.5   Smokeless tobacco: Never  Substance and Sexual Activity   Alcohol use: No    Alcohol/week: 1.0 standard drink of alcohol    Types: 1 Glasses of wine per week    Comment: EVERYDAY, three beers today   Drug use: Yes    Types: Marijuana   Sexual activity: Not Currently    Partners: Male  Other Topics Concern   Not on file  Social History Narrative   Not on file   Social Determinants of Health   Financial Resource Strain: Low Risk  (02/25/2022)   Overall Financial Resource Strain (CARDIA)    Difficulty of  Paying Living Expenses: Not hard at all  Food Insecurity: No Food Insecurity (02/25/2022)   Hunger Vital Sign    Worried About Running Out of Food in the Last Year: Never true    Ran Out of Food in the Last Year: Never true  Transportation Needs: No Transportation Needs (02/25/2022)   PRAPARE - Administrator, Civil Service (Medical): No    Lack of Transportation (Non-Medical): No  Physical Activity: Insufficiently Active (02/25/2022)   Exercise Vital Sign    Days of Exercise per Week: 4 days    Minutes of Exercise per Session: 30 min  Stress: No Stress Concern Present (02/25/2022)   [employer] of Occupational Health - Occupational Stress Questionnaire    Feeling of Stress : Only a little  Social  Connections: Moderately Isolated (02/25/2022)   Social Connection and Isolation Panel [NHANES]    Frequency of Communication with Friends and Family: Twice a week    Frequency of Social Gatherings with Friends and Family: Twice a week    Attends Religious Services: More than 4 times per year    Active Member of [employer] or Organizations: No    Attends Banker Meetings: Never    Marital Status: Widowed    Allergies  Allergen Reactions   Asa [Aspirin]     Avoid due to cerebral aneurysm   Nsaids     Avoid due to cerebral aneurysm    Shrimp [Shellfish Allergy] Hives     Current Outpatient Medications:    acetaminophen (TYLENOL) 500 MG tablet, Take 2 tablets (1,000 mg total) by mouth every 8 (eight) hours as needed., Disp: 30 tablet, Rfl: 0   baclofen (LIORESAL) 10 MG tablet, Take 1 tablet (10 mg total) by mouth 3 (three) times daily., Disp: 60 each, Rfl: 0   diclofenac (FLECTOR) 1.3 % PTCH, Place 1 patch onto the skin 2 (two) times daily. Over right hip. (Patient not taking: Reported on 02/25/2022), Disp: 60 patch, Rfl: 1   fluticasone (FLONASE) 50 MCG/ACT nasal spray, SPRAY 2 SPRAYS INTO EACH NOSTRIL EVERY DAY, Disp: 48 mL, Rfl: 3   gabapentin (NEURONTIN) 300 MG capsule, Take 1 capsule (300 mg total) by mouth 2 (two) times daily as needed (nerve pain (arm)). (Patient not taking: Reported on 10/22/2021), Disp: 60 capsule, Rfl: 2   hydrocortisone 2.5 % ointment, Apply topically 2 (two) times daily. (Patient not taking: Reported on 02/25/2022), Disp: 30 g, Rfl: 0   lisinopril (ZESTRIL) 20 MG tablet, TAKE 1 TABLET BY MOUTH EVERY DAY, Disp: 90 tablet, Rfl: 3   Naphazoline-Pheniramine (OPCON-A) 0.027-0.315 % SOLN, Place 1-2 drops into both eyes 3 (three) times daily as needed (allergy eyes)., Disp: , Rfl:    Review of Systems  Constitutional:  Negative for activity change, appetite change, chills, diaphoresis, fatigue, fever and unexpected weight change.  HENT:  Negative for congestion,  rhinorrhea, sinus pressure, sneezing, sore throat and trouble swallowing.   Eyes:  Negative for photophobia and visual disturbance.  Respiratory:  Negative for cough, chest tightness, shortness of breath, wheezing and stridor.   Cardiovascular:  Negative for chest pain, palpitations and leg swelling.  Gastrointestinal:  Negative for abdominal distention, abdominal pain, anal bleeding, blood in stool, constipation, diarrhea, nausea and vomiting.  Genitourinary:  Negative for difficulty urinating, dysuria, flank pain and hematuria.  Musculoskeletal:  Negative for arthralgias, back pain, gait problem, joint swelling and myalgias.  Skin:  Negative for color change, pallor, rash and wound.  Neurological:  Negative for dizziness,  tremors, weakness and light-headedness.  Hematological:  Negative for adenopathy. Does not bruise/bleed easily.  Psychiatric/Behavioral:  Negative for agitation, behavioral problems, confusion, decreased concentration, dysphoric mood and sleep disturbance.        Objective:   Physical Exam Constitutional:      General: She is not in acute distress.    Appearance: Normal appearance. She is well-developed. She is not ill-appearing or diaphoretic.  HENT:     Head: Normocephalic and atraumatic.     Right Ear: Hearing and external ear normal.     Left Ear: Hearing and external ear normal.     Nose: No nasal deformity or rhinorrhea.  Eyes:     General: No scleral icterus.    Conjunctiva/sclera: Conjunctivae normal.     Right eye: Right conjunctiva is not injected.     Left eye: Left conjunctiva is not injected.     Pupils: Pupils are equal, round, and reactive to light.  Neck:     Vascular: No JVD.  Cardiovascular:     Rate and Rhythm: Normal rate and regular rhythm.     Heart sounds: Normal heart sounds, S1 normal and S2 normal. No murmur heard.    No friction rub.  Abdominal:     General: Bowel sounds are normal. There is no distension.     Palpations: Abdomen is  soft.     Tenderness: There is no abdominal tenderness.  Musculoskeletal:        General: Normal range of motion.     Right shoulder: Normal.     Left shoulder: Normal.     Cervical back: Normal range of motion and neck supple.     Right hip: Normal.     Left hip: Normal.     Right knee: Normal.     Left knee: Normal.  Lymphadenopathy:     Head:     Right side of head: No submandibular, preauricular or posterior auricular adenopathy.     Left side of head: No submandibular, preauricular or posterior auricular adenopathy.     Cervical: No cervical adenopathy.     Right cervical: No superficial or deep cervical adenopathy.    Left cervical: No superficial or deep cervical adenopathy.  Skin:    General: Skin is warm and dry.     Coloration: Skin is not pale.     Findings: No abrasion, bruising, ecchymosis, erythema, lesion or rash.     Nails: There is no clubbing.  Neurological:     General: No focal deficit present.     Mental Status: She is alert and oriented to person, place, and time.     Sensory: No sensory deficit.     Coordination: Coordination normal.     Gait: Gait normal.  Psychiatric:        Attention and Perception: She is attentive.        Mood and Affect: Mood normal.        Speech: Speech normal.        Behavior: Behavior normal. Behavior is cooperative.        Thought Content: Thought content normal.        Judgment: Judgment normal.           Assessment & Plan:   HIV disease: Reviewed data regarding increased ALT levels inflammation in late controllers and reasons why it is still recommended to treat with antiretrovirals to reduce burden of inflammation.   I will add order HIV viral load CD4 count CBC with differential  CMP, RPR GC and chlamydia and I will continue  [employer]  Note she does not appear to have hepatitis B, infection so I think Dovato would be a reasonable option for her should she is to go on antiretrovirals   Patient  is still not want to go onto treatment unless her viral load comes up.  Hyperlipidemia: Reviewed results from reprieve study and also enforced the extra risk involved with Elite control.  Will initiate Crestor  Hyper tension: She is continue on lisinopril [place]  Smoking: She has stopped smoking

## 2022-07-14 LAB — T-HELPER CELLS (CD4) COUNT (NOT AT ARMC)
Absolute CD4: 1101 cells/uL (ref 490–1740)
CD4 T Helper %: 41 % (ref 30–61)
Total lymphocyte count: 2707 cells/uL (ref 850–3900)

## 2022-07-14 LAB — URINE CYTOLOGY ANCILLARY ONLY
Chlamydia: NEGATIVE
Comment: NEGATIVE
Comment: NORMAL
Neisseria Gonorrhea: NEGATIVE

## 2022-07-15 LAB — COMPLETE METABOLIC PANEL WITH GFR
AG Ratio: 1.4 (calc) (ref 1.0–2.5)
ALT: 17 U/L (ref 6–29)
AST: 18 U/L (ref 10–35)
Albumin: 4.2 g/dL (ref 3.6–5.1)
Alkaline phosphatase (APISO): 69 U/L (ref 37–153)
BUN/Creatinine Ratio: 24 (calc) — ABNORMAL HIGH (ref 6–22)
BUN: 11 mg/dL (ref 7–25)
CO2: 29 mmol/L (ref 20–32)
Calcium: 9.6 mg/dL (ref 8.6–10.4)
Chloride: 106 mmol/L (ref 98–110)
Creat: 0.46 mg/dL — ABNORMAL LOW (ref 0.50–1.05)
Globulin: 3.1 g/dL (calc) (ref 1.9–3.7)
Glucose, Bld: 77 mg/dL (ref 65–99)
Potassium: 4.2 mmol/L (ref 3.5–5.3)
Sodium: 139 mmol/L (ref 135–146)
Total Bilirubin: 0.3 mg/dL (ref 0.2–1.2)
Total Protein: 7.3 g/dL (ref 6.1–8.1)
eGFR: 104 mL/min/{1.73_m2} (ref >=60)

## 2022-07-15 LAB — CBC WITH DIFFERENTIAL/PLATELET
Absolute Monocytes: 467 cells/uL (ref 200–950)
Basophils Absolute: 83 cells/uL (ref 0–200)
Basophils Relative: 1.3 %
Eosinophils Absolute: 109 cells/uL (ref 15–500)
Eosinophils Relative: 1.7 %
HCT: 39.2 % (ref 35.0–45.0)
Hemoglobin: 12.8 g/dL (ref 11.7–15.5)
Lymphs Abs: 2790 cells/uL (ref 850–3900)
MCH: 25.4 pg — ABNORMAL LOW (ref 27.0–33.0)
MCHC: 32.7 g/dL (ref 32.0–36.0)
MCV: 77.8 fL — ABNORMAL LOW (ref 80.0–100.0)
MPV: 9 fL (ref 7.5–12.5)
Monocytes Relative: 7.3 %
Neutro Abs: 2950 cells/uL (ref 1500–7800)
Neutrophils Relative %: 46.1 %
Platelets: 455 10*3/uL — ABNORMAL HIGH (ref 140–400)
RBC: 5.04 10*6/uL (ref 3.80–5.10)
RDW: 17.1 % — ABNORMAL HIGH (ref 11.0–15.0)
Total Lymphocyte: 43.6 %
WBC: 6.4 10*3/uL (ref 3.8–10.8)

## 2022-07-15 LAB — LIPID PANEL
Cholesterol: 184 mg/dL (ref <200)
HDL: 91 mg/dL (ref >=50)
LDL Cholesterol (Calc): 79 mg/dL (calc)
Non-HDL Cholesterol (Calc): 93 mg/dL (calc) (ref <130)
Total CHOL/HDL Ratio: 2 (calc) (ref <5.0)
Triglycerides: 59 mg/dL (ref <150)

## 2022-07-15 LAB — HIV-1 RNA QUANT-NO REFLEX-BLD
HIV 1 RNA Quant: NOT DETECTED Copies/mL
HIV-1 RNA Quant, Log: NOT DETECTED Log cps/mL

## 2022-07-15 LAB — RPR: RPR Ser Ql: NONREACTIVE

## 2023-01-11 ENCOUNTER — Encounter: Payer: Self-pay | Admitting: Infectious Disease

## 2023-01-11 DIAGNOSIS — E785 Hyperlipidemia, unspecified: Secondary | ICD-10-CM | POA: Insufficient documentation

## 2023-01-11 NOTE — Progress Notes (Signed)
Subjective:  Complaint: Follow-up for HIV disease on medications   Patient ID: Connie Wade, female    DOB: 07/13/1955, 67 y.o.   MRN: [id number]  HPI  Discussed the use of AI scribe software for clinical note transcription with the patient, who gave verbal consent to proceed.  History of Present Illness   The patient, known to have HIV, has been an 'elite controller' and has not been on antiretroviral therapy except during a study ([id number]) where they were given Complera. They have not been on any HIV medications since the study. They express a general reluctance to take medications daily, including a statin that was recommended for cholesterol management and to reduce infllammation which is higher in HIV + but even higher yet in Elite controllers.. They also have hypertension, for which they take medication, albeit with some difficulty due to their aversion to daily medication intake.  The patient acknowledges their status as an elite controller but does not wish to start antiretroviral therapy. They also express reluctance to start the statin, citing difficulty with daily medication intake. They have not yet started the statin therapy.        Past Medical History:  Diagnosis Date   ANEMIA, OTHER, UNSPECIFIED 03/25/2006   Bright red blood per rectum 11/30/2019   DEPRESSION 03/17/2007   Enteritis 07/28/2019   Gout 04/27/2016   HEPATITIS B 03/25/2006   HIV 03/25/2006   since 2000   HYDRADENITIS 03/25/2006   HYPERLIPIDEMIA 03/25/2006   Hyperlipidemia 01/11/2023   HYPERTENSION, BENIGN ESSENTIAL 04/26/2007   MENOPAUSAL SYNDROME 03/25/2006   Qualifier: Diagnosis of  By: Para March MD, Graham     Neck pain 07/13/2018   TOBACCO DEPENDENCE 03/25/2006   Tubular adenoma of colon 06/23/2011   Benign, no high grade dysplasia 06/16/11 Followed by [facility] Dr. Ewing Schlein Repeat in 5 years (due 2018)   Tubular adenoma of colon 06/23/2011   Benign, no high grade dysplasia 06/16/11 Followed  by [facility] Dr. Ewing Schlein Repeat in 5 years (due 2018)   UTERINE FIBROID 03/25/2006    Past Surgical History:  Procedure Laterality Date   BREAST LUMPECTOMY Bilateral    coil     anuerysm   IR GENERIC HISTORICAL  07/26/2015   IR ANGIO INTRA EXTRACRAN SEL INTERNAL CAROTID BILAT MOD SED 07/26/2015 [facility], [facility]   IR GENERIC HISTORICAL  07/26/2015   IR ANGIO VERTEBRAL SEL VERTEBRAL BILAT MOD SED 07/26/2015 [facility], [facility]   OVARIAN CYST REMOVAL  2001   RADIOLOGY WITH ANESTHESIA N/A 12/05/2014   Procedure: RADIOLOGY WITH ANESTHESIA-COING FOR SUBARACHNOID HEMORRHAGE;  Surgeon: Lisbeth Renshaw, MD;  Location: [facility] OR;  Service: Radiology;  Laterality: N/A;   TEE WITHOUT CARDIOVERSION N/A 01/23/2020   Procedure: TRANSESOPHAGEAL ECHOCARDIOGRAM (TEE);  Surgeon: Quintella Reichert, MD;  Location: [facility] ENDOSCOPY;  Service: Cardiovascular;  Laterality: N/A;    Family History  Problem Relation Age of Onset   COPD Sister    Arthritis Mother    Alcohol abuse Father       Social History   Socioeconomic History   Marital status: Widowed    Spouse name: Not on file   Number of children: Not on file   Years of education: Not on file   Highest education level: Not on file  Occupational History   Not on file  Tobacco Use   Smoking status: Former    Current packs/day: 0.00    Types: Cigarettes    Quit date: 12/17/2014  Years since quitting: 8.0   Smokeless tobacco: Never  Substance and Sexual Activity   Alcohol use: Yes    Alcohol/week: 1.0 standard drink of alcohol    Types: 1 Glasses of wine per week    Comment: EVERYDAY, three beers today   Drug use: Yes    Types: Marijuana   Sexual activity: Not Currently    Partners: Male  Other Topics Concern   Not on file  Social History Narrative   Not on file   Social Drivers of Health   Financial Resource Strain: Low Risk  (02/25/2022)   Overall Financial Resource Strain (CARDIA)    Difficulty of Paying  Living Expenses: Not hard at all  Food Insecurity: No Food Insecurity (02/25/2022)   Hunger Vital Sign    Worried About Running Out of Food in the Last Year: Never true    Ran Out of Food in the Last Year: Never true  Transportation Needs: No Transportation Needs (02/25/2022)   PRAPARE - Administrator, Civil Service (Medical): No    Lack of Transportation (Non-Medical): No  Physical Activity: Insufficiently Active (02/25/2022)   Exercise Vital Sign    Days of Exercise per Week: 4 days    Minutes of Exercise per Session: 30 min  Stress: No Stress Concern Present (02/25/2022)   [employer] of Occupational Health - Occupational Stress Questionnaire    Feeling of Stress : Only a little  Social Connections: Unknown (03/18/2022)   [facility]   Social Network    Social Network: Not on file  Recent Concern: Social Connections - Moderately Isolated (02/25/2022)   Social Connection and Isolation Panel [NHANES]    Frequency of Communication with Friends and Family: Twice a week    Frequency of Social Gatherings with Friends and Family: Twice a week    Attends Religious Services: More than 4 times per year    Active Member of [employer] or Organizations: No    Attends Banker Meetings: Never    Marital Status: Widowed    Allergies  Allergen Reactions   Asa [Aspirin]     Avoid due to cerebral aneurysm   Nsaids     Avoid due to cerebral aneurysm    Shrimp [Shellfish Allergy] Hives     Current Outpatient Medications:    acetaminophen (TYLENOL) 500 MG tablet, Take 2 tablets (1,000 mg total) by mouth every 8 (eight) hours as needed., Disp: 30 tablet, Rfl: 0   fluticasone (FLONASE) 50 MCG/ACT nasal spray, SPRAY 2 SPRAYS INTO EACH NOSTRIL EVERY DAY, Disp: 48 mL, Rfl: 3   gabapentin (NEURONTIN) 300 MG capsule, Take 1 capsule (300 mg total) by mouth 2 (two) times daily as needed (nerve pain (arm)). (Patient not taking: Reported on 10/22/2021),  Disp: 60 capsule, Rfl: 2   hydrocortisone 2.5 % ointment, Apply topically 2 (two) times daily. (Patient not taking: Reported on 02/25/2022), Disp: 30 g, Rfl: 0   lisinopril (ZESTRIL) 20 MG tablet, TAKE 1 TABLET BY MOUTH EVERY DAY, Disp: 90 tablet, Rfl: 3   Naphazoline-Pheniramine (OPCON-A) 0.027-0.315 % SOLN, Place 1-2 drops into both eyes 3 (three) times daily as needed (allergy eyes)., Disp: , Rfl:    rosuvastatin (CRESTOR) 20 MG tablet, Take 1 tablet (20 mg total) by mouth daily., Disp: 30 tablet, Rfl: 11    Review of Systems  Constitutional:  Negative for activity change, appetite change, chills, diaphoresis, fatigue, fever and unexpected weight change.  HENT:  Negative for congestion,  rhinorrhea, sinus pressure, sneezing, sore throat and trouble swallowing.   Eyes:  Negative for photophobia and visual disturbance.  Respiratory:  Negative for cough, chest tightness, shortness of breath, wheezing and stridor.   Cardiovascular:  Negative for chest pain, palpitations and leg swelling.  Gastrointestinal:  Negative for abdominal distention, abdominal pain, anal bleeding, blood in stool, constipation, diarrhea, nausea and vomiting.  Genitourinary:  Negative for difficulty urinating, dysuria, flank pain and hematuria.  Musculoskeletal:  Negative for arthralgias, back pain, gait problem, joint swelling and myalgias.  Skin:  Negative for color change, pallor, rash and wound.  Neurological:  Negative for dizziness, tremors, weakness and light-headedness.  Hematological:  Negative for adenopathy. Does not bruise/bleed easily.  Psychiatric/Behavioral:  Negative for agitation, behavioral problems, confusion, decreased concentration, dysphoric mood and sleep disturbance.        Objective:   Physical Exam Constitutional:      General: She is not in acute distress.    Appearance: Normal appearance. She is well-developed. She is not ill-appearing or diaphoretic.  HENT:     Head: Normocephalic and  atraumatic.     Right Ear: Hearing and external ear normal.     Left Ear: Hearing and external ear normal.     Nose: No nasal deformity or rhinorrhea.  Eyes:     General: No scleral icterus.    Conjunctiva/sclera: Conjunctivae normal.     Right eye: Right conjunctiva is not injected.     Left eye: Left conjunctiva is not injected.     Pupils: Pupils are equal, round, and reactive to light.  Neck:     Vascular: No JVD.  Cardiovascular:     Rate and Rhythm: Normal rate and regular rhythm.     Heart sounds: S1 normal and S2 normal.  Pulmonary:     Effort: Pulmonary effort is normal. No respiratory distress.     Breath sounds: No wheezing.  Abdominal:     General: Bowel sounds are normal. There is no distension.     Palpations: Abdomen is soft.     Tenderness: There is no abdominal tenderness.  Musculoskeletal:        General: Normal range of motion.     Right shoulder: Normal.     Left shoulder: Normal.     Cervical back: Normal range of motion and neck supple.     Right hip: Normal.     Left hip: Normal.     Right knee: Normal.     Left knee: Normal.  Lymphadenopathy:     Head:     Right side of head: No submandibular, preauricular or posterior auricular adenopathy.     Left side of head: No submandibular, preauricular or posterior auricular adenopathy.     Cervical: No cervical adenopathy.     Right cervical: No superficial or deep cervical adenopathy.    Left cervical: No superficial or deep cervical adenopathy.  Skin:    General: Skin is warm and dry.     Coloration: Skin is not pale.     Findings: No abrasion, bruising, ecchymosis, erythema, lesion or rash.     Nails: There is no clubbing.  Neurological:     General: No focal deficit present.     Mental Status: She is alert and oriented to person, place, and time.     Sensory: No sensory deficit.     Coordination: Coordination normal.     Gait: Gait normal.  Psychiatric:        Attention and Perception:  She is  attentive.        Mood and Affect: Mood normal.        Speech: Speech normal.        Behavior: Behavior normal. Behavior is cooperative.        Thought Content: Thought content normal.        Judgment: Judgment normal.           Assessment & Plan:   Assessment and Plan    HIV Infection Elite controller with no current antiretroviral therapy. Discussed the potential benefits of antiretroviral therapy in reducing inflammation, but patient remains hesitant due to dislike of taking medications. -Continue to monitor viral load and CD4 count. -Discussed potential future options including Dovato or injectable therapies. if pills are the problem not medicines  Hyperlipidemia Patient not currently taking prescribed statin due to dislike of daily medications. Discussed the benefits of statin therapy in reducing inflammation and cardiovascular risk, particularly in the context of HIV infection. -Encouraged patient to start statin therapy and monitor for tolerance. -Consider alternative lipid-lowering therapies if statin not tolerated.  General Health Maintenance -Administer COVID-19 and influenza vaccines today. -Consider RSV vaccine. -Schedule follow-up visit in 10 months per [employer].

## 2023-01-12 ENCOUNTER — Other Ambulatory Visit: Payer: Self-pay

## 2023-01-12 ENCOUNTER — Ambulatory Visit: Payer: Medicare Other | Admitting: Infectious Disease

## 2023-01-12 ENCOUNTER — Encounter: Payer: Self-pay | Admitting: Infectious Disease

## 2023-01-12 VITALS — BP 139/82 | HR 89 | Temp 97.4°F | Ht 61.0 in | Wt 104.0 lb

## 2023-01-12 DIAGNOSIS — I10 Essential (primary) hypertension: Secondary | ICD-10-CM

## 2023-01-12 DIAGNOSIS — Z7185 Encounter for immunization safety counseling: Secondary | ICD-10-CM

## 2023-01-12 DIAGNOSIS — E785 Hyperlipidemia, unspecified: Secondary | ICD-10-CM

## 2023-01-12 DIAGNOSIS — Z23 Encounter for immunization: Secondary | ICD-10-CM | POA: Diagnosis not present

## 2023-01-12 DIAGNOSIS — B20 Human immunodeficiency virus [HIV] disease: Secondary | ICD-10-CM

## 2023-01-12 MED ORDER — ROSUVASTATIN CALCIUM 20 MG PO TABS: 20.0000 mg | ORAL_TABLET | Freq: Every day | ORAL | 11 refills | Status: DC

## 2023-01-12 NOTE — Addendum Note (Signed)
Addended by: Harley Alto on: 01/12/2023 02:08 PM   Modules accepted: Orders

## 2023-01-13 LAB — T-HELPER CELLS (CD4) COUNT (NOT AT ARMC)
CD4 % Helper T Cell: 42 % (ref 33–65)
CD4 T Cell Abs: 1192 /uL (ref 400–1790)

## 2023-01-14 LAB — COMPLETE METABOLIC PANEL WITH GFR
AG Ratio: 1.3 (calc) (ref 1.0–2.5)
ALT: 12 U/L (ref 6–29)
AST: 16 U/L (ref 10–35)
Albumin: 4.4 g/dL (ref 3.6–5.1)
Alkaline phosphatase (APISO): 76 U/L (ref 37–153)
BUN: 10 mg/dL (ref 7–25)
CO2: 26 mmol/L (ref 20–32)
Calcium: 9.7 mg/dL (ref 8.6–10.4)
Chloride: 104 mmol/L (ref 98–110)
Creat: 0.54 mg/dL (ref 0.50–1.05)
Globulin: 3.5 g/dL (ref 1.9–3.7)
Glucose, Bld: 70 mg/dL (ref 65–99)
Potassium: 4 mmol/L (ref 3.5–5.3)
Sodium: 139 mmol/L (ref 135–146)
Total Bilirubin: 0.4 mg/dL (ref 0.2–1.2)
Total Protein: 7.9 g/dL (ref 6.1–8.1)
eGFR: 100 mL/min/{1.73_m2} (ref >=60)

## 2023-01-14 LAB — CBC WITH DIFFERENTIAL/PLATELET
Absolute Lymphocytes: 3133 {cells}/uL (ref 850–3900)
Absolute Monocytes: 484 {cells}/uL (ref 200–950)
Basophils Absolute: 89 {cells}/uL (ref 0–200)
Basophils Relative: 1.5 %
Eosinophils Absolute: 83 {cells}/uL (ref 15–500)
Eosinophils Relative: 1.4 %
HCT: 40.7 % (ref 35.0–45.0)
Hemoglobin: 13.3 g/dL (ref 11.7–15.5)
MCH: 25.9 pg — ABNORMAL LOW (ref 27.0–33.0)
MCHC: 32.7 g/dL (ref 32.0–36.0)
MCV: 79.3 fL — ABNORMAL LOW (ref 80.0–100.0)
MPV: 9.5 fL (ref 7.5–12.5)
Monocytes Relative: 8.2 %
Neutro Abs: 2112 {cells}/uL (ref 1500–7800)
Neutrophils Relative %: 35.8 %
Platelets: 495 10*3/uL — ABNORMAL HIGH (ref 140–400)
RBC: 5.13 10*6/uL — ABNORMAL HIGH (ref 3.80–5.10)
RDW: 15.9 % — ABNORMAL HIGH (ref 11.0–15.0)
Total Lymphocyte: 53.1 %
WBC: 5.9 10*3/uL (ref 3.8–10.8)

## 2023-01-14 LAB — LIPID PANEL
Cholesterol: 193 mg/dL (ref <200)
HDL: 99 mg/dL (ref >=50)
LDL Cholesterol (Calc): 78 mg/dL
Non-HDL Cholesterol (Calc): 94 mg/dL (ref <130)
Total CHOL/HDL Ratio: 1.9 (calc) (ref <5.0)
Triglycerides: 77 mg/dL (ref <150)

## 2023-01-14 LAB — HEPATITIS A ANTIBODY, TOTAL: Hepatitis A AB,Total: NONREACTIVE

## 2023-01-14 LAB — HIV-1 RNA QUANT-NO REFLEX-BLD
HIV 1 RNA Quant: <20 {copies}/mL — ABNORMAL HIGH
HIV-1 RNA Quant, Log: <1.3 {Log} — ABNORMAL HIGH

## 2023-01-14 LAB — RPR: RPR Ser Ql: NONREACTIVE

## 2023-02-11 ENCOUNTER — Encounter: Payer: Self-pay | Admitting: *Deleted

## 2023-03-01 ENCOUNTER — Ambulatory Visit: Payer: Medicare Other

## 2023-03-01 VITALS — Ht 61.0 in | Wt 105.0 lb

## 2023-03-01 DIAGNOSIS — Z0000 Encounter for general adult medical examination without abnormal findings: Secondary | ICD-10-CM | POA: Diagnosis not present

## 2023-03-01 NOTE — Patient Instructions (Addendum)
Connie Wade , Thank you for taking time to come for your Medicare Wellness Visit. I appreciate your ongoing commitment to your health goals. Please review the following plan we discussed and let me know if I can assist you in the future.   Referrals/Orders/Follow-Ups/Clinician Recommendations: Yes; Keep maintaining your health by keeping your appointments with Dr. Phineas Real and any specialists that you may see.  Call us if you need anything.  Have a great year!!!!  This is a list of the screening recommended for you and due dates:  Health Maintenance  Topic Date Due   Zoster (Shingles) Vaccine (1 of 2) Never done   COVID-19 Vaccine (6 - 2024-25 season) 03/09/2023   Mammogram  11/18/2023   Medicare Annual Wellness Visit  02/29/2024   Colon Cancer Screening  07/01/2027   DTaP/Tdap/Td vaccine (3 - Td or Tdap) 10/20/2027   Pneumonia Vaccine  Completed   Flu Shot  Completed   DEXA scan (bone density measurement)  Completed   Hepatitis C Screening  Completed   HPV Vaccine  Aged Out    Advanced directives: (Declined) Advance directive discussed with you today. Even though you declined this today, please call our office should you change your mind, and we can give you the proper paperwork for you to fill out.  Next Medicare Annual Wellness Visit scheduled for next year: Yes It was nice speaking with you today! Your next Annual Wellness Visit is scheduled for 03/02/2024 at 9:20 a.m. via telephone. If you need to reschedule or cancel, please call [phone].

## 2023-03-01 NOTE — Progress Notes (Addendum)
Subjective:   Connie Wade is a 68 y.o. female who presents for Medicare Annual (Subsequent) preventive examination.  Visit Complete: Virtual I connected with  Estill Bakes on 03/01/23 by a audio enabled telemedicine application and verified that I am speaking with the correct person using two identifiers.  Patient Location: Home  Provider Location: Office/Clinic  I discussed the limitations of evaluation and management by telemedicine. The patient expressed understanding and agreed to proceed.  Vital Signs: Because this visit was a virtual/telehealth visit, some criteria may be missing or patient reported. Any vitals not documented were not able to be obtained and vitals that have been documented are patient reported.  This patient declined Interactive audio and Acupuncturist. Therefore the visit was completed with audio only.  Cardiac Risk Factors include: advanced age (>37men, >57 women);dyslipidemia;hypertension;sedentary lifestyle     Objective:    Today's Vitals   03/01/23 0922  Weight: 105 lb (47.6 kg)  Height: 5\' 1"  (1.549 m)  PainSc: 0-No pain   Body mass index is 19.84 kg/m.     03/01/2023    9:25 AM 03/12/2022    4:46 PM 02/25/2022   10:15 AM 01/02/2022    8:35 AM 10/22/2021   11:46 AM 04/07/2021   10:46 AM 03/11/2021    9:11 AM  Advanced Directives  Does Patient Have a Medical Advance Directive? No No No No No No No  Would patient like information on creating a medical advance directive? No - Patient declined No - Patient declined Yes (ED - Information included in AVS) No - Patient declined  No - Patient declined Yes (MAU/Ambulatory/Procedural Areas - Information given)    Current Medications (verified) Outpatient Encounter Medications as of 03/01/2023  Medication Sig   acetaminophen (TYLENOL) 500 MG tablet Take 2 tablets (1,000 mg total) by mouth every 8 (eight) hours as needed.   fluticasone (FLONASE) 50 MCG/ACT nasal spray SPRAY  2 SPRAYS INTO EACH NOSTRIL EVERY DAY   gabapentin (NEURONTIN) 300 MG capsule Take 1 capsule (300 mg total) by mouth 2 (two) times daily as needed (nerve pain (arm)).   lisinopril (ZESTRIL) 20 MG tablet TAKE 1 TABLET BY MOUTH EVERY DAY   Naphazoline-Pheniramine (OPCON-A) 0.027-0.315 % SOLN Place 1-2 drops into both eyes 3 (three) times daily as needed (allergy eyes).   rosuvastatin (CRESTOR) 20 MG tablet Take 1 tablet (20 mg total) by mouth daily.   No facility-administered encounter medications on file as of 03/01/2023.    Allergies (verified) Asa [aspirin], Nsaids, and Shrimp [shellfish allergy]   History: Past Medical History:  Diagnosis Date   ANEMIA, OTHER, UNSPECIFIED 03/25/2006   Bright red blood per rectum 11/30/2019   DEPRESSION 03/17/2007   Enteritis 07/28/2019   Gout 04/27/2016   HEPATITIS B 03/25/2006   HIV 03/25/2006   since 2000   HYDRADENITIS 03/25/2006   HYPERLIPIDEMIA 03/25/2006   Hyperlipidemia 01/11/2023   HYPERTENSION, BENIGN ESSENTIAL 04/26/2007   MENOPAUSAL SYNDROME 03/25/2006   Qualifier: Diagnosis of  By: Para March MD, [place]     Neck pain 07/13/2018   TOBACCO DEPENDENCE 03/25/2006   Tubular adenoma of colon 06/23/2011   Benign, no high grade dysplasia 06/16/11 Followed by [facility] Dr. Ewing Schlein Repeat in 5 years (due 2018)   Tubular adenoma of colon 06/23/2011   Benign, no high grade dysplasia 06/16/11 Followed by [facility] Dr. Ewing Schlein Repeat in 5 years (due 2018)   UTERINE FIBROID 03/25/2006   Past Surgical History:  Procedure Laterality Date   BREAST LUMPECTOMY  Bilateral    coil     anuerysm   IR GENERIC HISTORICAL  07/26/2015   IR ANGIO INTRA EXTRACRAN SEL INTERNAL CAROTID BILAT MOD SED 07/26/2015 Lisbeth Renshaw, MD [facility]   IR GENERIC HISTORICAL  07/26/2015   IR ANGIO VERTEBRAL SEL VERTEBRAL BILAT MOD SED 07/26/2015 Lisbeth Renshaw, MD [facility]   OVARIAN CYST REMOVAL  2001   RADIOLOGY WITH ANESTHESIA N/A 12/05/2014   Procedure: RADIOLOGY WITH  ANESTHESIA-COING FOR SUBARACHNOID HEMORRHAGE;  Surgeon: Lisbeth Renshaw, MD;  Location: [facility] OR;  Service: Radiology;  Laterality: N/A;   TEE WITHOUT CARDIOVERSION N/A 01/23/2020   Procedure: TRANSESOPHAGEAL ECHOCARDIOGRAM (TEE);  Surgeon: Quintella Reichert, MD;  Location: [facility];  Service: Cardiovascular;  Laterality: N/A;   Family History  Problem Relation Age of Onset   COPD Sister    Arthritis Mother    Alcohol abuse Father    Social History   Socioeconomic History   Marital status: Widowed    Spouse name: Not on file   Number of children: Not on file   Years of education: Not on file   Highest education level: Not on file  Occupational History   Not on file  Tobacco Use   Smoking status: Former    Current packs/day: 0.00    Types: Cigarettes    Quit date: 12/17/2014    Years since quitting: 8.2   Smokeless tobacco: Never  Substance and Sexual Activity   Alcohol use: Yes    Alcohol/week: 1.0 standard drink of alcohol    Types: 1 Glasses of wine per week    Comment: EVERYDAY, three beers today   Drug use: Yes    Types: Marijuana   Sexual activity: Not Currently    Partners: Male    Comment: accepted condoms  Other Topics Concern   Not on file  Social History Narrative   Not on file   Social Drivers of Health   Financial Resource Strain: Low Risk  (03/01/2023)   Overall Financial Resource Strain (CARDIA)    Difficulty of Paying Living Expenses: Not hard at all  Food Insecurity: No Food Insecurity (03/01/2023)   Hunger Vital Sign    Worried About Running Out of Food in the Last Year: Never true    Ran Out of Food in the Last Year: Never true  Transportation Needs: No Transportation Needs (03/01/2023)   PRAPARE - Administrator, Civil Service (Medical): No    Lack of Transportation (Non-Medical): No  Physical Activity: Inactive (03/01/2023)   Exercise Vital Sign    Days of Exercise per Week: 0 days    Minutes of Exercise per Session: 0 min  Stress: No  Stress Concern Present (03/01/2023)   [employer] of Occupational Health - Occupational Stress Questionnaire    Feeling of Stress : Only a little  Social Connections: Moderately Isolated (03/01/2023)   Social Connection and Isolation Panel [NHANES]    Frequency of Communication with Friends and Family: Twice a week    Frequency of Social Gatherings with Friends and Family: Twice a week    Attends Religious Services: More than 4 times per year    Active Member of [employer] or Organizations: No    Attends Banker Meetings: Never    Marital Status: Widowed    Tobacco Counseling Counseling given: Not Answered   Clinical Intake:  Pre-visit preparation completed: Yes  Pain : No/denies pain Pain Score: 0-No pain     BMI - recorded: 19.84 Nutritional Status:  BMI of 19-24  Normal Nutritional Risks: None Diabetes: No  How often do you need to have someone help you when you read instructions, pamphlets, or other written materials from your doctor or pharmacy?: 1 - Never What is the last grade level you completed in school?: SOME COLLEGE  Interpreter Needed?: No  Information entered by :: Dilana Mcphie N. Cena Bruhn, LPN.   Activities of Daily Living    03/01/2023    9:27 AM  In your present state of health, do you have any difficulty performing the following activities:  Hearing? 1  Vision? 0  Difficulty concentrating or making decisions? 1  Walking or climbing stairs? 1  Dressing or bathing? 0  Doing errands, shopping? 0  Preparing Food and eating ? N  Using the Toilet? N  In the past six months, have you accidently leaked urine? Y  Do you have problems with loss of bowel control? N  Managing your Medications? N  Managing your Finances? N  Housekeeping or managing your Housekeeping? N    Patient Care Team: Evette Georges, MD as PCP - General (Family Medicine) Daiva Eves, Lisette Grinder, MD as PCP - Infectious Diseases (Infectious Diseases) Quintella Reichert, MD as PCP -  Cardiology (Cardiology)  Indicate any recent Medical Services you may have received from other than [facility] providers in the past year (date may be approximate).     Assessment:   This is a routine wellness examination for Connie Wade.  Hearing/Vision screen Hearing Screening - Comments:: Patient has hearing difficulty; No hearing aids.  Vision Screening - Comments:: Wears rx glasses - up to date with routine eye exams with Dr. Harriette Bouillon    Goals Addressed             This Visit's Progress    Client understands the importance of follow-up with providers by attending scheduled visits        Depression Screen    03/01/2023    9:27 AM 01/12/2023   10:40 AM 07/13/2022    8:59 AM 03/12/2022    4:46 PM 02/25/2022   10:10 AM 01/02/2022    8:36 AM 10/22/2021   11:45 AM  PHQ 2/9 Scores  PHQ - 2 Score 0 0 0 0 0 1 1  PHQ- 9 Score 2   0   4    Fall Risk    03/01/2023    9:26 AM 01/12/2023   10:40 AM 07/13/2022    8:59 AM 03/12/2022    4:45 PM 02/25/2022   10:05 AM  Fall Risk   Falls in the past year? 0 0 0 0 0  Number falls in past yr: 0  0 0 0  Injury with Fall? 0  0 0 0  Risk for fall due to : No Fall Risks No Fall Risks No Fall Risks  No Fall Risks  Follow up Falls prevention discussed Falls evaluation completed Falls evaluation completed  Education provided;Falls prevention discussed    MEDICARE RISK AT HOME: Medicare Risk at Home Any stairs in or around the home?: Yes If so, are there any without handrails?: No Home free of loose throw rugs in walkways, pet beds, electrical cords, etc?: Yes Adequate lighting in your home to reduce risk of falls?: Yes Life alert?: No Use of a cane, walker or w/c?: Yes Grab bars in the bathroom?: Yes Shower chair or bench in shower?: Yes Elevated toilet seat or a handicapped toilet?: Yes  TIMED UP AND GO:  Was the test performed?  No  Cognitive Function:    03/01/2023    9:27 AM  MMSE - Mini Mental State Exam  Not completed: Unable to  complete        03/01/2023    9:26 AM 02/25/2022   10:18 AM  6CIT Screen  What Year? 0 points 0 points  What month? 0 points 0 points  What time? 0 points 0 points  Count back from 20 0 points 0 points  Months in reverse 0 points 2 points  Repeat phrase 0 points 2 points  Total Score 0 points 4 points    Immunizations Immunization History  Administered Date(s) Administered   Fluad Quad(high Dose 65+) 12/11/2019, 04/07/2021, 10/22/2021   Fluad Trivalent(High Dose 65+) 01/12/2023   Influenza Split 11/16/2011   Influenza Whole 02/25/2004, 04/23/2010   Influenza, Seasonal, Injecte, Preservative Fre 11/01/2012   Influenza,inj,Quad PF,6+ Mos 10/17/2013, 12/24/2014, 01/17/2016, 10/19/2017, 12/01/2018   Moderna Sars-Covid-2 Vaccination 04/11/2019   PFIZER Comirnaty(Gray Top)Covid-19 Tri-Sucrose Vaccine 01/06/2021   PFIZER(Purple Top)SARS-COV-2 Vaccination 03/09/2019, 12/11/2019   PNEUMOCOCCAL CONJUGATE-20 10/22/2021   Pfizer(Comirnaty)Fall Seasonal Vaccine 12 years and older 01/12/2023   Pneumococcal Conjugate-13 12/01/2018   Pneumococcal Polysaccharide-23 02/27/2004, 04/23/2010   Td 03/17/2007   Tdap 10/19/2017    TDAP status: Up to date  Flu Vaccine status: Up to date  Pneumococcal vaccine status: Up to date  Covid-19 vaccine status: Completed vaccines  Qualifies for Shingles Vaccine? Yes   Zostavax completed No   Shingrix Completed?: No.    Education has been provided regarding the importance of this vaccine. Patient has been advised to call insurance company to determine out of pocket expense if they have not yet received this vaccine. Advised may also receive vaccine at local pharmacy or Health Dept. Verbalized acceptance and understanding.  Screening Tests Health Maintenance  Topic Date Due   Zoster Vaccines- Shingrix (1 of 2) Never done   COVID-19 Vaccine (6 - 2024-25 season) 03/09/2023   MAMMOGRAM  11/18/2023   Medicare Annual Wellness (AWV)  02/29/2024    Colonoscopy  07/01/2027   DTaP/Tdap/Td (3 - Td or Tdap) 10/20/2027   Pneumonia Vaccine 59+ Years old  Completed   INFLUENZA VACCINE  Completed   DEXA SCAN  Completed   Hepatitis C Screening  Completed   HPV VACCINES  Aged Out    Health Maintenance  Health Maintenance Due  Topic Date Due   Zoster Vaccines- Shingrix (1 of 2) Never done    Colorectal cancer screening: Type of screening: Colonoscopy. Completed 07/01/2022. Repeat every 5 years  Mammogram status: Completed 11/17/2021. Repeat every year-due every 2 years  Bone Density status: Completed 02/27/2019. Results reflect: Bone density results: NORMAL. Repeat every 5 years.  Lung Cancer Screening: (Low Dose CT Chest recommended if Age 68-80 years, 20 pack-year currently smoking OR have quit w/in 15years.) does not qualify.   Lung Cancer Screening Referral: No  Additional Screening:  Hepatitis C Screening: does qualify; Completed 04/03/2014  Vision Screening: Recommended annual ophthalmology exams for early detection of glaucoma and other disorders of the eye. Is the patient up to date with their annual eye exam?  Yes  Who is the provider or what is the name of the office in which the patient attends annual eye exams? Dr. Harriette Bouillon If pt is not established with a provider, would they like to be referred to a provider to establish care? No .   Dental Screening: Recommended annual dental exams for proper oral hygiene  Community Resource Referral / Chronic Care Management: CRR required  this visit?  No   CCM required this visit?  No     Plan:     I have personally reviewed and noted the following in the patient's chart:   Medical and social history Use of alcohol, tobacco or illicit drugs  Current medications and supplements including opioid prescriptions. Patient is not currently taking opioid prescriptions. Functional ability and status Nutritional status Physical activity Advanced directives List of other  physicians Hospitalizations, surgeries, and ER visits in previous 12 months Vitals Screenings to include cognitive, depression, and falls Referrals and appointments  In addition, I have reviewed and discussed with patient certain preventive protocols, quality metrics, and best practice recommendations. A written personalized care plan for preventive services as well as general preventive health recommendations were provided to patient.     Mickeal Needy, LPN   09/01/5782   After Visit Summary: (MyChart) Due to this being a telephonic visit, the after visit summary with patients personalized plan was offered to patient via MyChart   Nurse Notes: Patient is due for Shingrix Vaccine.

## 2023-03-09 ENCOUNTER — Encounter: Payer: Self-pay | Admitting: Family Medicine

## 2023-03-09 ENCOUNTER — Ambulatory Visit: Payer: Medicare Other | Admitting: Family Medicine

## 2023-03-09 VITALS — BP 116/80 | HR 86 | Temp 98.3°F | Ht 61.0 in | Wt 112.0 lb

## 2023-03-09 DIAGNOSIS — M545 Low back pain, unspecified: Secondary | ICD-10-CM | POA: Diagnosis not present

## 2023-03-09 DIAGNOSIS — M542 Cervicalgia: Secondary | ICD-10-CM | POA: Diagnosis not present

## 2023-03-09 DIAGNOSIS — Z122 Encounter for screening for malignant neoplasm of respiratory organs: Secondary | ICD-10-CM | POA: Diagnosis not present

## 2023-03-09 DIAGNOSIS — E785 Hyperlipidemia, unspecified: Secondary | ICD-10-CM | POA: Diagnosis not present

## 2023-03-09 DIAGNOSIS — I10 Essential (primary) hypertension: Secondary | ICD-10-CM | POA: Diagnosis not present

## 2023-03-09 DIAGNOSIS — I358 Other nonrheumatic aortic valve disorders: Secondary | ICD-10-CM | POA: Diagnosis not present

## 2023-03-09 DIAGNOSIS — G8929 Other chronic pain: Secondary | ICD-10-CM

## 2023-03-09 DIAGNOSIS — M79609 Pain in unspecified limb: Secondary | ICD-10-CM | POA: Diagnosis not present

## 2023-03-09 DIAGNOSIS — B20 Human immunodeficiency virus [HIV] disease: Secondary | ICD-10-CM

## 2023-03-09 DIAGNOSIS — Z23 Encounter for immunization: Secondary | ICD-10-CM | POA: Diagnosis not present

## 2023-03-09 DIAGNOSIS — M539 Dorsopathy, unspecified: Secondary | ICD-10-CM

## 2023-03-09 MED ORDER — PREDNISONE 20 MG PO TABS: 40.0000 mg | ORAL_TABLET | Freq: Every day | ORAL | 0 refills | Status: AC

## 2023-03-09 NOTE — Progress Notes (Unsigned)
    SUBJECTIVE:   CHIEF COMPLAINT / HPI:   HIV elite controller without HAART Sees ID for this. Continuing to monitor CD4 count and viral load.  HLD Discussed crestor 20 mg daily at last visit with ID and highly encouraged use. She does not want to take this.  HTN Takes lisinopril 20 mg daily, but she does not like to take this.  Tobacco use Stopped smoking in 2016. Smoked about a ppd for 20 years. Has not had any screening for lung cancer in the past.  Back, neck pain Has arthritis in the back and cervical spine as seen on imaging many years ago. Has not been able to see neurosurgery because she has not been able to pay them for a procedure before. Both have been worsening since being cold outside. No shooting pain into the arms or legs, but she does have some numbness in all of her hands for years. Feels she does not have the strength to open jars anymore.  LLE pain Pain occurs on the side of the leg. It gets worse if she walks too much. The pain is dull and achy in quality. No rashes over the leg.  PERTINENT  PMH / PSH: neuropathy of the R arm on gabapentin, SAH 2/2 ruptured aneurysm, lumbar DJD  OBJECTIVE:   BP 116/80   Pulse 86   Temp 98.3 F (36.8 C)   Ht 5\' 1"  (1.549 m)   Wt 112 lb (50.8 kg)   SpO2 100%   BMI 21.16 kg/m   General: Alert and oriented, in NAD Skin: Warm, dry, and intact without lesions HEENT: NCAT, EOM grossly normal, midline nasal septum Cardiac: RRR, no m/r/g appreciated Respiratory: CTAB, breathing and speaking comfortably on RA Abdominal: Soft, nontender, nondistended, normoactive bowel sounds Extremities: Moves all extremities grossly equally Neurological: No gross focal deficit Psychiatric: Appropriate mood and affect   ASSESSMENT/PLAN:   No problem-specific Assessment & Plan notes found for this encounter.   Health maintenance Shingrix vaccine***  Janeal Holmes, MD [facility]

## 2023-03-09 NOTE — Patient Instructions (Signed)
I have sent in a course of prednisone to help with inflammation in the neck, back, and leg which will also help with pain send you cannot take NSAIDs given your aneurysm.  I have also ordered neck and back x-rays which you can going get now at the [facility] imaging center on [address] [place].  I have referred you back to physical therapy given this helped in the past.  Let me know if you not hear from them within 2 weeks.

## 2023-03-10 DIAGNOSIS — M79609 Pain in unspecified limb: Secondary | ICD-10-CM | POA: Insufficient documentation

## 2023-03-10 NOTE — Assessment & Plan Note (Signed)
Resistant to starting statin in the setting of HIV infection.  Continue to discuss as desired.

## 2023-03-10 NOTE — Assessment & Plan Note (Addendum)
As appreciated on prior cervical and lumbar films.  Given acute worsening with bony tenderness, will update cervical and lumbar x-rays today, though feel this is likely progression of her DDD.  Reassured by lack of true radicular symptoms.  However, given patient discomfort and inability to take NSAIDs due to ruptured aneurysm, will send in prednisone burst for 5 days and f/u improvement.  Also referred to physical therapy for further evaluation.

## 2023-03-10 NOTE — Assessment & Plan Note (Signed)
Controlled today on lisinopril.  Continue current management.

## 2023-03-10 NOTE — Assessment & Plan Note (Signed)
Elite controller that follows with ID without medication.

## 2023-03-10 NOTE — Assessment & Plan Note (Signed)
Of the left leg.  Feel this is likely stress-induced with walking long distances.  No pain with regular ambulation less likely a stress fracture.  LDL controlled, which would make PAD less likely.  Reassured by lack of edema, erythema, and pain to palpation, making DVT less likely.  Referral to physical therapy placed for further strengthening.  Can consider ABIs/imaging if continues.

## 2023-03-19 ENCOUNTER — Encounter: Payer: Self-pay | Admitting: Family Medicine

## 2023-03-19 DIAGNOSIS — H25013 Cortical age-related cataract, bilateral: Secondary | ICD-10-CM | POA: Diagnosis not present

## 2023-03-19 DIAGNOSIS — H52203 Unspecified astigmatism, bilateral: Secondary | ICD-10-CM | POA: Diagnosis not present

## 2023-03-19 MED ORDER — LISINOPRIL 20 MG PO TABS: 20.0000 mg | ORAL_TABLET | Freq: Every day | ORAL | 3 refills | Status: DC

## 2023-03-30 DIAGNOSIS — M545 Low back pain, unspecified: Secondary | ICD-10-CM | POA: Diagnosis not present

## 2023-03-30 DIAGNOSIS — M6281 Muscle weakness (generalized): Secondary | ICD-10-CM | POA: Diagnosis not present

## 2023-03-30 DIAGNOSIS — S39012D Strain of muscle, fascia and tendon of lower back, subsequent encounter: Secondary | ICD-10-CM | POA: Diagnosis not present

## 2023-04-05 DIAGNOSIS — M6281 Muscle weakness (generalized): Secondary | ICD-10-CM | POA: Diagnosis not present

## 2023-04-05 DIAGNOSIS — S39012D Strain of muscle, fascia and tendon of lower back, subsequent encounter: Secondary | ICD-10-CM | POA: Diagnosis not present

## 2023-04-05 DIAGNOSIS — M545 Low back pain, unspecified: Secondary | ICD-10-CM | POA: Diagnosis not present

## 2023-04-06 ENCOUNTER — Ambulatory Visit
Admission: RE | Admit: 2023-04-06 | Discharge: 2023-04-06 | Disposition: A | Discharge status: Home / Self Care | Payer: Medicare Other | Source: Ambulatory Visit | Attending: Family Medicine | Admitting: Family Medicine

## 2023-04-06 DIAGNOSIS — Z87891 Personal history of nicotine dependence: Secondary | ICD-10-CM | POA: Diagnosis not present

## 2023-04-06 DIAGNOSIS — Z122 Encounter for screening for malignant neoplasm of respiratory organs: Secondary | ICD-10-CM

## 2023-04-22 DIAGNOSIS — H2511 Age-related nuclear cataract, right eye: Secondary | ICD-10-CM | POA: Diagnosis not present

## 2023-04-22 DIAGNOSIS — H25811 Combined forms of age-related cataract, right eye: Secondary | ICD-10-CM | POA: Diagnosis not present

## 2023-05-03 ENCOUNTER — Encounter: Payer: Self-pay | Admitting: Family Medicine

## 2023-05-05 DIAGNOSIS — M545 Low back pain, unspecified: Secondary | ICD-10-CM | POA: Diagnosis not present

## 2023-05-05 DIAGNOSIS — S39012D Strain of muscle, fascia and tendon of lower back, subsequent encounter: Secondary | ICD-10-CM | POA: Diagnosis not present

## 2023-05-05 DIAGNOSIS — M6281 Muscle weakness (generalized): Secondary | ICD-10-CM | POA: Diagnosis not present

## 2023-05-12 DIAGNOSIS — M545 Low back pain, unspecified: Secondary | ICD-10-CM | POA: Diagnosis not present

## 2023-05-12 DIAGNOSIS — M6281 Muscle weakness (generalized): Secondary | ICD-10-CM | POA: Diagnosis not present

## 2023-05-12 DIAGNOSIS — S39012D Strain of muscle, fascia and tendon of lower back, subsequent encounter: Secondary | ICD-10-CM | POA: Diagnosis not present

## 2023-05-19 DIAGNOSIS — M6281 Muscle weakness (generalized): Secondary | ICD-10-CM | POA: Diagnosis not present

## 2023-05-19 DIAGNOSIS — M545 Low back pain, unspecified: Secondary | ICD-10-CM | POA: Diagnosis not present

## 2023-05-19 DIAGNOSIS — S39012D Strain of muscle, fascia and tendon of lower back, subsequent encounter: Secondary | ICD-10-CM | POA: Diagnosis not present

## 2023-06-02 DIAGNOSIS — S39012D Strain of muscle, fascia and tendon of lower back, subsequent encounter: Secondary | ICD-10-CM | POA: Diagnosis not present

## 2023-06-02 DIAGNOSIS — M6281 Muscle weakness (generalized): Secondary | ICD-10-CM | POA: Diagnosis not present

## 2023-06-02 DIAGNOSIS — M545 Low back pain, unspecified: Secondary | ICD-10-CM | POA: Diagnosis not present

## 2023-06-08 NOTE — Progress Notes (Signed)
 The 10-year ASCVD risk score (Arnett DK, et al., 2019) is: 10.2%   Values used to calculate the score:     Age: 68 years     Sex: Female     Is Non-Hispanic African American: Yes     Diabetic: No     Tobacco smoker: No     Systolic Blood Pressure: 116 mmHg     Is BP treated: Yes     HDL Cholesterol: 99 mg/dL     Total Cholesterol: 193 mg/dL  Arlon Bergamo, BSN, RN

## 2023-06-17 DIAGNOSIS — H21562 Pupillary abnormality, left eye: Secondary | ICD-10-CM | POA: Diagnosis not present

## 2023-06-17 DIAGNOSIS — H25812 Combined forms of age-related cataract, left eye: Secondary | ICD-10-CM | POA: Diagnosis not present

## 2023-06-17 DIAGNOSIS — H2512 Age-related nuclear cataract, left eye: Secondary | ICD-10-CM | POA: Diagnosis not present

## 2023-09-07 DIAGNOSIS — H3581 Retinal edema: Secondary | ICD-10-CM | POA: Diagnosis not present

## 2023-09-07 DIAGNOSIS — H30033 Focal chorioretinal inflammation, peripheral, bilateral: Secondary | ICD-10-CM | POA: Diagnosis not present

## 2023-09-07 DIAGNOSIS — Z961 Presence of intraocular lens: Secondary | ICD-10-CM | POA: Diagnosis not present

## 2023-10-22 ENCOUNTER — Other Ambulatory Visit: Payer: Self-pay

## 2023-10-23 MED ORDER — FLUTICASONE PROPIONATE 50 MCG/ACT NA SUSP: 2.0000 | Freq: Every day | NASAL | 3 refills | Status: AC

## 2023-10-29 ENCOUNTER — Encounter: Payer: Self-pay | Admitting: Pharmacist

## 2023-10-29 NOTE — Progress Notes (Signed)
 This patient is appearing on a report for being at risk of failing the adherence measure for medications this calendar year.   Medication: lisinopril  20 mg Last fill date: 08/11/23 for 90 day supply  Insurance report was not up to date. No action needed at this time.

## 2023-11-05 ENCOUNTER — Other Ambulatory Visit: Payer: Self-pay | Admitting: Family Medicine

## 2024-01-16 ENCOUNTER — Other Ambulatory Visit: Payer: Self-pay | Admitting: Infectious Disease

## 2024-01-16 DIAGNOSIS — E785 Hyperlipidemia, unspecified: Secondary | ICD-10-CM

## 2024-01-16 DIAGNOSIS — B20 Human immunodeficiency virus [HIV] disease: Secondary | ICD-10-CM

## 2024-01-18 NOTE — Telephone Encounter (Signed)
 Due for appt

## 2024-01-21 ENCOUNTER — Telehealth: Payer: Self-pay

## 2024-01-21 NOTE — Telephone Encounter (Signed)
 Called patient to schedule appointment, no answer left HIPAA compliant vm to contact office.

## 2024-02-09 ENCOUNTER — Telehealth: Payer: Self-pay

## 2024-02-09 ENCOUNTER — Encounter: Payer: Self-pay | Admitting: Infectious Disease

## 2024-02-09 NOTE — Telephone Encounter (Signed)
 Call can not be completed, letter has been sent. Patient is overdue for an appointment with Dr. Fleeta Rothman

## 2024-02-16 ENCOUNTER — Other Ambulatory Visit: Payer: Self-pay | Admitting: Infectious Disease

## 2024-02-16 DIAGNOSIS — B20 Human immunodeficiency virus [HIV] disease: Secondary | ICD-10-CM

## 2024-02-16 DIAGNOSIS — E785 Hyperlipidemia, unspecified: Secondary | ICD-10-CM

## 2024-02-20 ENCOUNTER — Other Ambulatory Visit: Payer: Self-pay | Admitting: Infectious Disease

## 2024-02-20 DIAGNOSIS — E785 Hyperlipidemia, unspecified: Secondary | ICD-10-CM

## 2024-02-20 DIAGNOSIS — B20 Human immunodeficiency virus [HIV] disease: Secondary | ICD-10-CM

## 2024-04-17 ENCOUNTER — Encounter
